# Patient Record
Sex: Female | Born: 1975 | Race: Black or African American | Hispanic: No | Marital: Single | Smoking: Light tobacco smoker
Health system: Southern US, Community
[De-identification: ages and names within clinical notes are randomized; demographics above are authoritative.]

## PROBLEM LIST (undated history)

## (undated) ENCOUNTER — Emergency Department (HOSPITAL_COMMUNITY): Discharge status: Absent without leave | Payer: Medicaid - Out of State | Source: Home / Self Care

## (undated) DIAGNOSIS — G8929 Other chronic pain: Secondary | ICD-10-CM

## (undated) DIAGNOSIS — I10 Essential (primary) hypertension: Secondary | ICD-10-CM

## (undated) DIAGNOSIS — D219 Benign neoplasm of connective and other soft tissue, unspecified: Secondary | ICD-10-CM

## (undated) DIAGNOSIS — E0500 Thyrotoxicosis with diffuse goiter without thyrotoxic crisis or storm: Secondary | ICD-10-CM

## (undated) DIAGNOSIS — D649 Anemia, unspecified: Secondary | ICD-10-CM

## (undated) DIAGNOSIS — O139 Gestational [pregnancy-induced] hypertension without significant proteinuria, unspecified trimester: Secondary | ICD-10-CM

## (undated) DIAGNOSIS — M549 Dorsalgia, unspecified: Secondary | ICD-10-CM

## (undated) HISTORY — PX: NO PAST SURGERIES: SHX2092

---

## 1998-05-08 ENCOUNTER — Inpatient Hospital Stay (HOSPITAL_COMMUNITY): Admission: AD | Admit: 1998-05-08 | Discharge: 1998-05-08 | Payer: Self-pay | Admitting: Obstetrics

## 1998-06-13 ENCOUNTER — Ambulatory Visit (HOSPITAL_COMMUNITY): Admission: RE | Admit: 1998-06-13 | Discharge: 1998-06-13 | Payer: Self-pay | Admitting: Obstetrics

## 1998-07-03 ENCOUNTER — Inpatient Hospital Stay (HOSPITAL_COMMUNITY): Admission: AD | Admit: 1998-07-03 | Discharge: 1998-07-03 | Payer: Self-pay | Admitting: *Deleted

## 1998-07-27 ENCOUNTER — Inpatient Hospital Stay (HOSPITAL_COMMUNITY): Admission: AD | Admit: 1998-07-27 | Discharge: 1998-07-27 | Payer: Self-pay | Admitting: Obstetrics & Gynecology

## 1998-07-29 ENCOUNTER — Inpatient Hospital Stay (HOSPITAL_COMMUNITY): Admission: AD | Admit: 1998-07-29 | Discharge: 1998-07-31 | Payer: Self-pay | Admitting: *Deleted

## 2001-07-02 ENCOUNTER — Encounter: Payer: Self-pay | Admitting: *Deleted

## 2001-07-02 ENCOUNTER — Encounter: Admission: RE | Admit: 2001-07-02 | Discharge: 2001-07-02 | Payer: Self-pay | Admitting: *Deleted

## 2001-10-26 ENCOUNTER — Inpatient Hospital Stay (HOSPITAL_COMMUNITY): Admission: AD | Admit: 2001-10-26 | Discharge: 2001-10-26 | Payer: Self-pay | Admitting: *Deleted

## 2003-10-15 ENCOUNTER — Inpatient Hospital Stay (HOSPITAL_COMMUNITY): Admission: AD | Admit: 2003-10-15 | Discharge: 2003-10-15 | Payer: Self-pay | Admitting: *Deleted

## 2003-10-18 ENCOUNTER — Inpatient Hospital Stay (HOSPITAL_COMMUNITY): Admission: AD | Admit: 2003-10-18 | Discharge: 2003-10-18 | Payer: Self-pay | Admitting: *Deleted

## 2003-11-23 ENCOUNTER — Inpatient Hospital Stay (HOSPITAL_COMMUNITY): Admission: AD | Admit: 2003-11-23 | Discharge: 2003-11-23 | Payer: Self-pay | Admitting: Obstetrics and Gynecology

## 2003-12-12 ENCOUNTER — Inpatient Hospital Stay (HOSPITAL_COMMUNITY): Admission: AD | Admit: 2003-12-12 | Discharge: 2003-12-13 | Payer: Self-pay | Admitting: Obstetrics and Gynecology

## 2004-04-17 ENCOUNTER — Inpatient Hospital Stay (HOSPITAL_COMMUNITY): Admission: AD | Admit: 2004-04-17 | Discharge: 2004-04-17 | Payer: Self-pay | Admitting: Family Medicine

## 2004-05-24 ENCOUNTER — Ambulatory Visit (HOSPITAL_COMMUNITY): Admission: RE | Admit: 2004-05-24 | Discharge: 2004-05-24 | Payer: Self-pay | Admitting: *Deleted

## 2004-08-07 ENCOUNTER — Ambulatory Visit: Payer: Self-pay | Admitting: *Deleted

## 2004-08-07 ENCOUNTER — Inpatient Hospital Stay (HOSPITAL_COMMUNITY): Admission: AD | Admit: 2004-08-07 | Discharge: 2004-08-10 | Payer: Self-pay | Admitting: *Deleted

## 2008-02-06 ENCOUNTER — Inpatient Hospital Stay (HOSPITAL_COMMUNITY): Admission: AD | Admit: 2008-02-06 | Discharge: 2008-02-06 | Payer: Self-pay | Admitting: Obstetrics & Gynecology

## 2008-10-15 ENCOUNTER — Inpatient Hospital Stay (HOSPITAL_COMMUNITY): Admission: AD | Admit: 2008-10-15 | Discharge: 2008-10-15 | Payer: Self-pay | Admitting: Obstetrics & Gynecology

## 2009-09-30 ENCOUNTER — Ambulatory Visit: Payer: Self-pay | Admitting: Obstetrics and Gynecology

## 2009-09-30 ENCOUNTER — Inpatient Hospital Stay (HOSPITAL_COMMUNITY): Admission: AD | Admit: 2009-09-30 | Discharge: 2009-09-30 | Payer: Self-pay | Admitting: Obstetrics & Gynecology

## 2010-07-30 LAB — POCT PREGNANCY, URINE: Preg Test, Ur: POSITIVE

## 2010-07-30 LAB — URINALYSIS, ROUTINE W REFLEX MICROSCOPIC
Glucose, UA: NEGATIVE mg/dL
Hgb urine dipstick: NEGATIVE
Specific Gravity, Urine: >1.03 — ABNORMAL HIGH (ref 1.005–1.030)
Urobilinogen, UA: 0.2 mg/dL (ref 0.0–1.0)

## 2010-07-30 LAB — URINE MICROSCOPIC-ADD ON

## 2011-05-26 ENCOUNTER — Emergency Department (HOSPITAL_COMMUNITY)
Admission: EM | Admit: 2011-05-26 | Discharge: 2011-05-26 | Disposition: A | Discharge status: Home / Self Care | Payer: Self-pay | Attending: Emergency Medicine | Admitting: Emergency Medicine

## 2011-05-26 ENCOUNTER — Emergency Department (HOSPITAL_COMMUNITY): Payer: Self-pay

## 2011-05-26 ENCOUNTER — Encounter (HOSPITAL_COMMUNITY): Payer: Self-pay

## 2011-05-26 DIAGNOSIS — I10 Essential (primary) hypertension: Secondary | ICD-10-CM | POA: Insufficient documentation

## 2011-05-26 DIAGNOSIS — M542 Cervicalgia: Secondary | ICD-10-CM | POA: Insufficient documentation

## 2011-05-26 DIAGNOSIS — Z79899 Other long term (current) drug therapy: Secondary | ICD-10-CM | POA: Insufficient documentation

## 2011-05-26 DIAGNOSIS — M549 Dorsalgia, unspecified: Secondary | ICD-10-CM

## 2011-05-26 DIAGNOSIS — R209 Unspecified disturbances of skin sensation: Secondary | ICD-10-CM | POA: Insufficient documentation

## 2011-05-26 DIAGNOSIS — M545 Low back pain, unspecified: Secondary | ICD-10-CM | POA: Insufficient documentation

## 2011-05-26 DIAGNOSIS — R51 Headache: Secondary | ICD-10-CM | POA: Insufficient documentation

## 2011-05-26 HISTORY — DX: Other chronic pain: G89.29

## 2011-05-26 HISTORY — DX: Dorsalgia, unspecified: M54.9

## 2011-05-26 HISTORY — DX: Essential (primary) hypertension: I10

## 2011-05-26 MED ORDER — OXYCODONE-ACETAMINOPHEN 5-325 MG PO TABS
1.0000 | ORAL_TABLET | Freq: Once | ORAL | Status: AC
  Administered 2011-05-26: 1 via ORAL
  Filled 2011-05-26: qty 1

## 2011-05-26 MED ORDER — OXYCODONE-ACETAMINOPHEN 5-325 MG PO TABS: 1.0000 | ORAL_TABLET | ORAL | Status: AC | PRN
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
## 2011-06-24 ENCOUNTER — Encounter (HOSPITAL_COMMUNITY): Payer: Self-pay | Admitting: Emergency Medicine

## 2011-06-24 ENCOUNTER — Emergency Department (HOSPITAL_COMMUNITY)
Admission: EM | Admit: 2011-06-24 | Discharge: 2011-06-25 | Disposition: A | Discharge status: Home / Self Care | Payer: Self-pay | Attending: Emergency Medicine | Admitting: Emergency Medicine

## 2011-06-24 DIAGNOSIS — I10 Essential (primary) hypertension: Secondary | ICD-10-CM | POA: Insufficient documentation

## 2011-06-24 DIAGNOSIS — M549 Dorsalgia, unspecified: Secondary | ICD-10-CM | POA: Insufficient documentation

## 2011-06-24 DIAGNOSIS — D649 Anemia, unspecified: Secondary | ICD-10-CM | POA: Insufficient documentation

## 2011-06-24 DIAGNOSIS — R109 Unspecified abdominal pain: Secondary | ICD-10-CM | POA: Insufficient documentation

## 2011-06-24 DIAGNOSIS — R10819 Abdominal tenderness, unspecified site: Secondary | ICD-10-CM | POA: Insufficient documentation

## 2011-06-24 DIAGNOSIS — G8929 Other chronic pain: Secondary | ICD-10-CM | POA: Insufficient documentation

## 2011-06-24 LAB — BASIC METABOLIC PANEL
BUN: 9 mg/dL (ref 6–23)
CO2: 23 mEq/L (ref 19–32)
Calcium: 10 mg/dL (ref 8.4–10.5)
Chloride: 103 mEq/L (ref 96–112)
Creatinine, Ser: 0.53 mg/dL (ref 0.50–1.10)
GFR calc Af Amer: >90 mL/min (ref >90)
GFR calc non Af Amer: >90 mL/min (ref >90)
Glucose, Bld: 111 mg/dL — ABNORMAL HIGH (ref 70–99)
Potassium: 3.8 mEq/L (ref 3.5–5.1)
Sodium: 136 mEq/L (ref 135–145)

## 2011-06-24 LAB — URINALYSIS, ROUTINE W REFLEX MICROSCOPIC
Glucose, UA: NEGATIVE mg/dL
Hgb urine dipstick: NEGATIVE
Ketones, ur: NEGATIVE mg/dL
Nitrite: NEGATIVE
Protein, ur: NEGATIVE mg/dL
Specific Gravity, Urine: 1.028 (ref 1.005–1.030)
Urobilinogen, UA: 1 mg/dL (ref 0.0–1.0)
pH: 5.5 (ref 5.0–8.0)

## 2011-06-24 LAB — CBC
HCT: 30.7 % — ABNORMAL LOW (ref 36.0–46.0)
Hemoglobin: 9.8 g/dL — ABNORMAL LOW (ref 12.0–15.0)
MCH: 20 pg — ABNORMAL LOW (ref 26.0–34.0)
MCHC: 31.9 g/dL (ref 30.0–36.0)
MCV: 62.5 fL — ABNORMAL LOW (ref 78.0–100.0)
Platelets: 309 10*3/uL (ref 150–400)
RBC: 4.91 MIL/uL (ref 3.87–5.11)
RDW: 17 % — ABNORMAL HIGH (ref 11.5–15.5)
WBC: 5.5 10*3/uL (ref 4.0–10.5)

## 2011-06-24 LAB — DIFFERENTIAL
Basophils Absolute: 0 10*3/uL (ref 0.0–0.1)
Basophils Relative: 0 % (ref 0–1)
Eosinophils Absolute: 0.1 10*3/uL (ref 0.0–0.7)
Eosinophils Relative: 2 % (ref 0–5)
Lymphocytes Relative: 40 % (ref 12–46)
Lymphs Abs: 2.2 10*3/uL (ref 0.7–4.0)
Monocytes Absolute: 0.4 10*3/uL (ref 0.1–1.0)
Monocytes Relative: 8 % (ref 3–12)
Neutro Abs: 2.7 10*3/uL (ref 1.7–7.7)
Neutrophils Relative %: 50 % (ref 43–77)

## 2011-06-24 LAB — URINE MICROSCOPIC-ADD ON

## 2011-06-24 LAB — PREGNANCY, URINE: Preg Test, Ur: NEGATIVE

## 2011-06-24 MED ORDER — SULFAMETHOXAZOLE-TRIMETHOPRIM 800-160 MG PO TABS: 1.0000 | ORAL_TABLET | Freq: Two times a day (BID) | ORAL | Status: AC

## 2011-06-24 NOTE — ED Notes (Signed)
PT. REPORTS LOW ABDOMINAL PAIN ONSET 2 DAYS AGO , DENIES NAUSEA, VOMITTING OR DIARRHEA , NO FEVER OR CHILLS.

## 2011-06-24 NOTE — Discharge Instructions (Signed)

## 2011-06-24 NOTE — ED Notes (Signed)
PT. REPORTS LOW ABDOMINAL PAIN FOR 2 DAYS , DENIES NAUSEA , VOMITTING OR DIARRHEA.  NO VAGINAL DISCHARGE .

## 2011-06-24 NOTE — ED Notes (Signed)
Patient alert and oriented with no distress noted.  Patient rating pain at a 7.  Patient states pain is about the same, no change.

## 2011-06-24 NOTE — ED Provider Notes (Signed)
History    35yf with abdominal pain. Gradual onset 3d ago. Lower abdomen. Does not lateralize. No appreciable exacerbating or relieving factors. Does not radiate. Urinary urgency and urine appears darker than normal. No fever or chills. No unusual vaginal bleeding or discharge. LMP February 23. Denies hx of abdominal or pelvic surgery. Sexually active. Hx of UTI and PID.   CSN: [id number]  Arrival date & time 06/24/11  4098   First MD Initiated Contact with Patient 06/24/11 2303      Chief Complaint  Patient presents with  . Abdominal Pain    (Consider location/radiation/quality/duration/timing/severity/associated sxs/prior treatment) HPI  Past Medical History  Diagnosis Date  . Chronic back pain   . Hypertension     History reviewed. No pertinent past surgical history.  No family history on file.  History  Substance Use Topics  . Smoking status: Never Smoker   . Smokeless tobacco: Not on file  . Alcohol Use: No    OB History    Grav Para Term Preterm Abortions TAB SAB Ect Mult Living                  Review of Systems   Review of symptoms negative unless otherwise noted in HPI.   Allergies  Review of patient's allergies indicates no known allergies.  Home Medications   Current Outpatient Rx  Name Route Sig Dispense Refill  . ADULT MULTIVITAMIN W/MINERALS CH Oral Take 1 tablet by mouth daily.      BP 139/91  Pulse 99  Temp(Src) 97.6 F (36.4 C) (Oral)  Resp 18  SpO2 98%  LMP 05/16/2011  Physical Exam  Nursing note and vitals reviewed. Constitutional: She appears well-developed and well-nourished. No distress.  HENT:  Head: Normocephalic and atraumatic.  Eyes: Conjunctivae are normal. Right eye exhibits no discharge. Left eye exhibits no discharge.  Neck: Neck supple.  Cardiovascular: Normal rate, regular rhythm and normal heart sounds.  Exam reveals no gallop and no friction rub.   No murmur heard. Pulmonary/Chest: Effort normal and breath  sounds normal. No respiratory distress.  Abdominal: Soft. She exhibits no distension. There is tenderness.       mild suprapubic tenderness without guarding or rebound. No distension. No mass palpated.  Genitourinary:       No cva tenderness b/l.  Musculoskeletal: She exhibits no edema and no tenderness.  Neurological: She is alert.  Skin: Skin is warm and dry.  Psychiatric: She has a normal mood and affect. Her behavior is normal. Thought content normal.    ED Course  Procedures (including critical care time)  Labs Reviewed  URINALYSIS, ROUTINE W REFLEX MICROSCOPIC - Abnormal; Notable for the following:    APPearance CLOUDY (*)    Bilirubin Urine SMALL (*)    Leukocytes, UA SMALL (*)    All other components within normal limits  CBC - Abnormal; Notable for the following:    Hemoglobin 9.8 (*)    HCT 30.7 (*)    MCV 62.5 (*)    MCH 20.0 (*)    RDW 17.0 (*)    All other components within normal limits  BASIC METABOLIC PANEL - Abnormal; Notable for the following:    Glucose, Bld 111 (*)    All other components within normal limits  URINE MICROSCOPIC-ADD ON - Abnormal; Notable for the following:    Squamous Epithelial / LPF MANY (*)    Bacteria, UA MANY (*)    Casts HYALINE CASTS (*)    All other components  within normal limits  PREGNANCY, URINE  DIFFERENTIAL  GC/CHLAMYDIA PROBE AMP, GENITAL  WET PREP, GENITAL   No results found.   1. Abdominal pain   2. anemia    MDM  35yf with abdominal pain. Mild suprapubic tenderness on exam. Noted to be anemic, chronicity unclear. No prior labs for comparison. Patient denies known history of anemia. She denies heavy periods. No use of blood thinning medication. Denies dizziness, lightheadedness or shortness of breath. Patient is refusing pelvic exam. She is requesting treatment for possible pelvic inflammatory disease and bacterial vaginosis since she has a history of this. Explained to patient that it is irresponsible medicine to  just simply treat empirically and cannot make these diagnoses without doing a pelvic examination. Will treat for possible urinary tract infection. There were bacteria on patient's urinalysis, but there are also a lot of squamous cells and this may just be contamination. Given patient's symptoms though, do not feel it is unreasonable to treat for UTI. Patient states that she will just get her pelvic examination by her PCP which is done in the past. Return precautions were discussed. Outpatient followup.       Raeford Razor, MD 06/24/11 [phone]

## 2011-06-25 NOTE — ED Notes (Signed)
Introduced self to patient.  Patient states she does not want a pelvic exam and is ready to go.  Dr. Juleen China notified.

## 2011-10-01 ENCOUNTER — Emergency Department (HOSPITAL_COMMUNITY)
Admission: EM | Admit: 2011-10-01 | Discharge: 2011-10-01 | Disposition: A | Discharge status: Home Health | Payer: Self-pay | Attending: Emergency Medicine | Admitting: Emergency Medicine

## 2011-10-01 ENCOUNTER — Encounter (HOSPITAL_COMMUNITY): Payer: Self-pay | Admitting: Emergency Medicine

## 2011-10-01 DIAGNOSIS — G8929 Other chronic pain: Secondary | ICD-10-CM | POA: Insufficient documentation

## 2011-10-01 DIAGNOSIS — L03019 Cellulitis of unspecified finger: Secondary | ICD-10-CM | POA: Insufficient documentation

## 2011-10-01 DIAGNOSIS — I10 Essential (primary) hypertension: Secondary | ICD-10-CM | POA: Insufficient documentation

## 2011-10-01 DIAGNOSIS — IMO0001 Reserved for inherently not codable concepts without codable children: Secondary | ICD-10-CM

## 2011-10-01 DIAGNOSIS — F17200 Nicotine dependence, unspecified, uncomplicated: Secondary | ICD-10-CM | POA: Insufficient documentation

## 2011-10-01 DIAGNOSIS — M549 Dorsalgia, unspecified: Secondary | ICD-10-CM | POA: Insufficient documentation

## 2011-10-01 MED ORDER — SULFAMETHOXAZOLE-TRIMETHOPRIM 800-160 MG PO TABS: 1.0000 | ORAL_TABLET | Freq: Two times a day (BID) | ORAL | Status: DC

## 2011-10-01 MED ORDER — SULFAMETHOXAZOLE-TRIMETHOPRIM 800-160 MG PO TABS: 1.0000 | ORAL_TABLET | Freq: Two times a day (BID) | ORAL | Status: AC

## 2011-10-01 NOTE — ED Provider Notes (Signed)
Medical screening examination/treatment/procedure(s) were performed by non-physician practitioner and as supervising physician I was immediately available for consultation/collaboration.  Donnetta Hutching, MD 10/01/11 [phone]

## 2011-10-01 NOTE — ED Provider Notes (Signed)
History     CSN: [id number]  Arrival date & time 10/01/11  0935   First MD Initiated Contact with Patient 10/01/11 [phone]      Chief Complaint  Patient presents with  . Finger Injury    (Consider location/radiation/quality/duration/timing/severity/associated sxs/prior treatment) HPI   Patient presents to emergency department complaining of a three-day history of gradual onset infection surrounding her cuticle of her second digit. Patient states that she had a manicure about a week ago and then 3 days ago began to have some redness and swelling around the cuticle of her right second finger. Patient states that she has been soaking her finger in peroxide at home and covering with the bandage however ongoing redness and tenderness to palpation. She denies any noticeable drainage. She denies pain with range of motion of her finger. She denies fevers, chills, drainage, or pain extending or radiating into her hand. Patient states she has history of some chronic back pain and hypertension that is followed by her primary care provider, Dr. Mayford Knife. Denies aggravating or alleviating factors.  Past Medical History  Diagnosis Date  . Chronic back pain   . Hypertension     History reviewed. No pertinent past surgical history.  History reviewed. No pertinent family history.  History  Substance Use Topics  . Smoking status: Current Everyday Smoker  . Smokeless tobacco: Not on file  . Alcohol Use: No    OB History    Grav Para Term Preterm Abortions TAB SAB Ect Mult Living                  Review of Systems  All other systems reviewed and are negative.    Allergies  Hydrocodone and Latex  Home Medications   Current Outpatient Rx  Name Route Sig Dispense Refill  . ADULT MULTIVITAMIN W/MINERALS CH Oral Take 1 tablet by mouth daily.    Marland Kitchen NAPROXEN SODIUM 220 MG PO TABS Oral Take 220 mg by mouth 2 (two) times daily as needed. For pain    . SULFAMETHOXAZOLE-TRIMETHOPRIM 800-160 MG  PO TABS Oral Take 1 tablet by mouth every 12 (twelve) hours. 10 tablet 0    BP 152/89  Pulse 118  Temp(Src) 98.1 F (36.7 C) (Oral)  Resp 16  SpO2 96%  Physical Exam  Constitutional: She is oriented to person, place, and time. She appears well-developed and well-nourished. No distress.  HENT:  Head: Normocephalic and atraumatic.  Eyes: Conjunctivae are normal.  Cardiovascular: Normal rate and regular rhythm.   Pulmonary/Chest: Effort normal.  Musculoskeletal: Normal range of motion. She exhibits edema and tenderness.       Right ankle: She exhibits swelling. tenderness.       TTP of cuticle of right second finger but no TTP of pulp of finger or DIP joint. FROM of finger with no pain. Soft tissue swelling around cuticle of right second finger with erythema but no fluctuance or induration.   Normal cap refill and sensation of entire finger  Neurological: She is alert and oriented to person, place, and time.       Normal sensation of entire foot.   Skin: Skin is warm and dry. No rash noted. She is not diaphoretic. There is erythema. No pallor.       Soft tissue swelling and erythema of cuticle region of right second finger but no induration or fluctuance.   Psychiatric: She has a normal mood and affect. Her behavior is normal.    ED Course  Procedures (including  critical care time)   Labs Reviewed - No data to display No results found.   1. Paronychia of second finger of right hand       MDM  Paronychia of right pointer finger without drainable abscess or sign or felon. TTP at distal tip around nail edge but no TTP of pulp of finger and no TTP of DIP joint with FROM of finger with 5/5 strength without pain. Patient has PCP that she is agreeable to close follow up in 2-3 days for recheck but returning to ER sooner for changing or worsening of symptoms.         [place], [place] 10/01/11 1020

## 2011-10-01 NOTE — ED Notes (Signed)
Pt sts cuticle infection to right 2nd digit; pt sts had nails done 1 week ago; pt noted to have dirt under finger nails; pt sts draining pus

## 2011-10-01 NOTE — Discharge Instructions (Signed)
Soak finger in warm soapy water in the morning and evening and then apply topical antibiotic ointment such as polysporin. Take oral antibiotic in complete course. Call Dr. Mayford Knife today to schedule close follow up appointment in 2-3 days for recheck of finger but return to ER for any changing or worsening of symptoms.   Paronychia Paronychia is an inflammatory reaction involving the folds of the skin surrounding the fingernail. This is commonly caused by an infection in the skin around a nail. The most common cause of paronychia is frequent wetting of the hands (as seen with bartenders, food servers, nurses or others who wet their hands). This makes the skin around the fingernail susceptible to infection by bacteria (germs) or fungus. Other predisposing factors are:  Aggressive manicuring.   Nail biting.   Thumb sucking.  The most common cause is a staphylococcal (a type of germ) infection, or a fungal (Candida) infection. When caused by a germ, it usually comes on suddenly with redness, swelling, pus and is often painful. It may get under the nail and form an abscess (collection of pus), or form an abscess around the nail. If the nail itself is infected with a fungus, the treatment is usually prolonged and may require oral medicine for up to one year. Your caregiver will determine the length of time treatment is required. The paronychia caused by bacteria (germs) may largely be avoided by not pulling on hangnails or picking at cuticles. When the infection occurs at the tips of the finger it is called felon. When the cause of paronychia is from the herpes simplex virus (HSV) it is called herpetic whitlow. TREATMENT  When an abscess is present treatment is often incision and drainage. This means that the abscess must be cut open so the pus can get out. When this is done, the following home care instructions should be followed. HOME CARE INSTRUCTIONS   It is important to keep the affected fingers very  dry. Rubber or plastic gloves over cotton gloves should be used whenever the hand must be placed in water.   Keep wound clean, dry and dressed as suggested by your caregiver between warm soaks or warm compresses.   Soak in warm water for fifteen to twenty minutes three to four times per day for bacterial infections. Fungal infections are very difficult to treat, so often require treatment for long periods of time.   For bacterial (germ) infections take antibiotics (medicine which kill germs) as directed and finish the prescription, even if the problem appears to be solved before the medicine is gone.   Only take over-the-counter or prescription medicines for pain, discomfort, or fever as directed by your caregiver.  SEEK IMMEDIATE MEDICAL CARE IF:  You have redness, swelling, or increasing pain in the wound.   You notice pus coming from the wound.   You have a fever.   You notice a bad smell coming from the wound or dressing.  Document Released: 10/02/2000 Document Revised: 03/28/2011 Document Reviewed: 06/03/2008 [facility] Patient Information 2012 [place], [place].

## 2011-11-21 ENCOUNTER — Encounter: Payer: Self-pay | Admitting: Obstetrics & Gynecology

## 2012-02-11 ENCOUNTER — Inpatient Hospital Stay (HOSPITAL_COMMUNITY)
Admission: AD | Admit: 2012-02-11 | Discharge: 2012-02-11 | Disposition: A | Discharge status: Hospice | Payer: Medicaid Other | Source: Ambulatory Visit | Attending: Obstetrics | Admitting: Obstetrics

## 2012-02-11 ENCOUNTER — Encounter (HOSPITAL_COMMUNITY): Payer: Self-pay | Admitting: *Deleted

## 2012-02-11 DIAGNOSIS — R109 Unspecified abdominal pain: Secondary | ICD-10-CM | POA: Insufficient documentation

## 2012-02-11 DIAGNOSIS — Z3202 Encounter for pregnancy test, result negative: Secondary | ICD-10-CM | POA: Insufficient documentation

## 2012-02-11 HISTORY — DX: Gestational (pregnancy-induced) hypertension without significant proteinuria, unspecified trimester: O13.9

## 2012-02-11 HISTORY — DX: Anemia, unspecified: D64.9

## 2012-02-11 LAB — URINALYSIS, ROUTINE W REFLEX MICROSCOPIC
Nitrite: NEGATIVE
Urobilinogen, UA: 0.2 mg/dL (ref 0.0–1.0)
pH: 6 (ref 5.0–8.0)

## 2012-02-11 LAB — POCT PREGNANCY, URINE: Preg Test, Ur: NEGATIVE

## 2012-02-11 NOTE — MAU Provider Note (Signed)
History     CSN: [id number]  Arrival date and time: 02/11/12 1732   None     No chief complaint on file.  HPI 36 y.o. [id number] with lower abdominal pain for 3 days. Pain is crampy, comes and goes. It has now resolved. No vaginal bleeding or discharge. Did have BV a month ago, treated.  Missed period - LMP:  9/15. Worried she is pregnant.  No contraception.   Just had pelvic exam/pap a month ago with [facility].  OB History    Grav Para Term Preterm Abortions TAB SAB Ect Mult Living   5 3 3  2 1  1  3       Past Medical History  Diagnosis Date  . Chronic back pain   . Hypertension   . Pregnancy induced hypertension   . Anemia     Past Surgical History  Procedure Date  . No past surgeries     Family History  Problem Relation Age of Onset  . Diabetes Maternal Aunt   . Diabetes Maternal Uncle   . Diabetes Maternal Grandmother   . Diabetes Maternal Grandfather   . Stroke Paternal Grandfather     History  Substance Use Topics  . Smoking status: Current Every Day Smoker -- 7.0 packs/day for 14 years    Types: Cigarettes  . Smokeless tobacco: Not on file  . Alcohol Use: Yes     on occasion    Allergies:  Allergies  Allergen Reactions  . Hydrocodone Nausea And Vomiting  . Latex Itching and Rash    Prescriptions prior to admission  Medication Sig Dispense Refill  . aspirin 325 MG tablet Take 650 mg by mouth every 6 (six) hours as needed. For pain      . Multiple Vitamin (MULITIVITAMIN WITH MINERALS) TABS Take 1 tablet by mouth daily.      Marland Kitchen Phenylephrine-DM-GG-APAP (VICKS DAYQUIL SEVERE COLD/FLU) 5-10-200-325 MG/15ML LIQD Take 30 mLs by mouth every 4 (four) hours as needed. For cold symptoms        Review of Systems  Constitutional: Negative for fever and chills.  HENT: Positive for congestion.   Eyes: Negative for blurred vision and double vision.  Gastrointestinal: Negative for nausea, vomiting, diarrhea and constipation.  Genitourinary: Negative for dysuria,  urgency and frequency.  Neurological: Negative for dizziness and headaches.    Physical Exam   Blood pressure 148/91, pulse 121, temperature 98.1 F (36.7 C), temperature source Oral, resp. rate 18, height 5\' 5"  (1.651 m), weight 52.164 kg (115 lb), last menstrual period 01/05/2012.  Physical Exam  Constitutional: She is oriented to person, place, and time. She appears well-developed and well-nourished. No distress.  HENT:  Head: Normocephalic and atraumatic.  Eyes: Conjunctivae normal and EOM are normal.  Neck: Normal range of motion. Neck supple.  Cardiovascular: Normal rate, regular rhythm and normal heart sounds.   Respiratory: Effort normal and breath sounds normal. No respiratory distress.  GI: Soft. Bowel sounds are normal. She exhibits no distension. There is no tenderness. There is no rebound and no guarding.  Genitourinary:       Declined pelvic exam.  Musculoskeletal: Normal range of motion. She exhibits no edema and no tenderness.  Neurological: She is alert and oriented to person, place, and time.  Skin: Skin is warm and dry.  Psychiatric: She has a normal mood and affect.   Results for orders placed during the hospital encounter of 02/11/12 (from the past 24 hour(s))  URINALYSIS, ROUTINE W REFLEX MICROSCOPIC  Status: Normal   Collection Time   02/11/12  5:50 PM      Component Value Range   Color, Urine YELLOW  YELLOW   APPearance CLEAR  CLEAR   Specific Gravity, Urine 1.025  1.005 - 1.030   pH 6.0  5.0 - 8.0   Glucose, UA NEGATIVE  NEGATIVE mg/dL   Hgb urine dipstick NEGATIVE  NEGATIVE   Bilirubin Urine NEGATIVE  NEGATIVE   Ketones, ur NEGATIVE  NEGATIVE mg/dL   Protein, ur NEGATIVE  NEGATIVE mg/dL   Urobilinogen, UA 0.2  0.0 - 1.0 mg/dL   Nitrite NEGATIVE  NEGATIVE   Leukocytes, UA NEGATIVE  NEGATIVE  POCT PREGNANCY, URINE     Status: Normal   Collection Time   02/11/12  5:57 PM      Component Value Range   Preg Test, Ur NEGATIVE  NEGATIVE    MAU  Course  Procedures    Assessment and Plan  36 y.o. [id number] with abdominal pain, now resolved. - Declines pelvic exam - Really only came for pregnancy test - Discharge home - f/u with [facility] as scheduled.  Napoleon Form 02/11/2012, 6:32 PM

## 2012-02-11 NOTE — MAU Note (Signed)
Pt was treated for BV one month ago, and states she started with low abd pain for the past three days and denies any urinary problems.  Says she thinks she is pregnant and wanted to come here for a pregnancy test.

## 2012-05-13 ENCOUNTER — Encounter (HOSPITAL_COMMUNITY): Payer: Self-pay | Admitting: *Deleted

## 2012-05-13 ENCOUNTER — Emergency Department (HOSPITAL_COMMUNITY)
Admission: EM | Admit: 2012-05-13 | Discharge: 2012-05-13 | Disposition: A | Discharge status: Home / Self Care | Payer: Medicaid Other | Attending: Emergency Medicine | Admitting: Emergency Medicine

## 2012-05-13 DIAGNOSIS — F17200 Nicotine dependence, unspecified, uncomplicated: Secondary | ICD-10-CM | POA: Insufficient documentation

## 2012-05-13 DIAGNOSIS — G8929 Other chronic pain: Secondary | ICD-10-CM | POA: Insufficient documentation

## 2012-05-13 DIAGNOSIS — I10 Essential (primary) hypertension: Secondary | ICD-10-CM | POA: Insufficient documentation

## 2012-05-13 DIAGNOSIS — Z201 Contact with and (suspected) exposure to tuberculosis: Secondary | ICD-10-CM

## 2012-05-13 DIAGNOSIS — Z8739 Personal history of other diseases of the musculoskeletal system and connective tissue: Secondary | ICD-10-CM | POA: Insufficient documentation

## 2012-05-13 DIAGNOSIS — Z8679 Personal history of other diseases of the circulatory system: Secondary | ICD-10-CM | POA: Insufficient documentation

## 2012-05-13 DIAGNOSIS — Z3202 Encounter for pregnancy test, result negative: Secondary | ICD-10-CM | POA: Insufficient documentation

## 2012-05-13 DIAGNOSIS — Z862 Personal history of diseases of the blood and blood-forming organs and certain disorders involving the immune mechanism: Secondary | ICD-10-CM | POA: Insufficient documentation

## 2012-05-13 LAB — PREGNANCY, URINE: Preg Test, Ur: NEGATIVE
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
## 2012-05-13 NOTE — Discharge Instructions (Signed)
If you believe you have been exposed to tuberculosis, please make an appointment with your primary care physician or with the health department as soon as possible for risk assessment. See the attached documentation for signs and symptoms.

## 2012-05-16 NOTE — ED Provider Notes (Signed)
Medical screening examination/treatment/procedure(s) were performed by non-physician practitioner and as supervising physician I was immediately available for consultation/collaboration.   Marciano Mundt, MD 05/16/12 2354 

## 2012-05-16 NOTE — ED Provider Notes (Signed)
History     CSN: [id number]  Arrival date & time 05/13/12  2048   First MD Initiated Contact with Patient 05/13/12 2227      No chief complaint on file.   (Consider location/radiation/quality/duration/timing/severity/associated sxs/prior treatment) HPI Comments: 37 yo female requesting pregnancy test and assessment for TB exposure. Pt states after she was in contact with someone who told her they had TB, she developed a dry cough for the last week. Non productive. Pt admits 98 pack year history of smoking. Denies hemoptysis, fever, illness, night sweats, dyspnea, allergies, recent travel, nausea, or vomiting. Pt rates cough as mild. Nothing makes it better or worse. Pt has taken no interventions. Pt has not had a recent PPD.  Patient is a 37 y.o. female presenting with cough.  Cough Pertinent negatives include no chest pain, no headaches and no shortness of breath.    Past Medical History  Diagnosis Date  . Chronic back pain   . Hypertension   . Pregnancy induced hypertension   . Anemia     Past Surgical History  Procedure Date  . No past surgeries     Family History  Problem Relation Age of Onset  . Diabetes Maternal Aunt   . Diabetes Maternal Uncle   . Diabetes Maternal Grandmother   . Diabetes Maternal Grandfather   . Stroke Paternal Grandfather     History  Substance Use Topics  . Smoking status: Current Every Day Smoker -- 7.0 packs/day for 14 years    Types: Cigarettes  . Smokeless tobacco: Not on file  . Alcohol Use: Yes     Comment: on occasion    OB History    Grav Para Term Preterm Abortions TAB SAB Ect Mult Living   5 3 3  2 1  1  3       Review of Systems  Constitutional: Negative for fever and diaphoresis.  HENT: Negative for neck pain and neck stiffness.   Eyes: Negative for visual disturbance.  Respiratory: Positive for cough. Negative for apnea, chest tightness and shortness of breath.   Cardiovascular: Negative for chest pain and  palpitations.  Gastrointestinal: Negative for nausea, vomiting, diarrhea and constipation.  Genitourinary: Negative for dysuria.  Musculoskeletal: Negative for gait problem.  Skin: Negative for rash.  Neurological: Negative for dizziness, weakness, light-headedness, numbness and headaches.    Allergies  Hydrocodone and Latex  Home Medications  No current outpatient prescriptions on file.  BP 155/89  Pulse 100  Temp 98.7 F (37.1 C) (Oral)  Resp 18  SpO2 97%  LMP 04/24/2012  Physical Exam  Nursing note and vitals reviewed. Constitutional: She is oriented to person, place, and time. She appears well-developed and well-nourished. No distress.  HENT:  Head: Normocephalic and atraumatic.       No cough appreciated on exam.  Eyes: EOM are normal. Pupils are equal, round, and reactive to light.  Neck: Normal range of motion. Neck supple.       No meningeal signs  Cardiovascular: Normal rate, regular rhythm and normal heart sounds.  Exam reveals no gallop and no friction rub.   No murmur heard. Pulmonary/Chest: Effort normal and breath sounds normal. No respiratory distress. She has no wheezes. She has no rales. She exhibits no tenderness.  Abdominal: Soft. Bowel sounds are normal. She exhibits no distension. There is no tenderness. There is no rebound and no guarding.  Musculoskeletal: Normal range of motion. She exhibits no edema and no tenderness.  Neurological: She is alert and  oriented to person, place, and time. No cranial nerve deficit.  Skin: Skin is warm and dry. She is not diaphoretic. No erythema.    ED Course  Procedures (including critical care time)   Labs Reviewed  PREGNANCY, URINE  LAB REPORT - SCANNED   Results for orders placed during the hospital encounter of 05/13/12  PREGNANCY, URINE      Component Value Range   Preg Test, Ur NEGATIVE  NEGATIVE    No results found.   1. Exposure to TB       MDM  Pt requested pregnancy test which was  negative.  Consulted with ID MD on call who confirmed that pt needed follow up with PCP or with [facility] including PPD and proper risk assessment to assess whether or not true exposure has occurred.   At this time there does not appear to be any evidence of an acute emergency medical condition and the patient appears stable for discharge with appropriate outpatient follow up.Diagnosis was discussed with patient who verbalizes understanding and is agreeable to discharge. Pt case discussed with Dr. Ranae Palms who agrees with my plan.   Glade Nurse, PA-C 05/16/12 2228

## 2013-01-11 DIAGNOSIS — I5021 Acute systolic (congestive) heart failure: Principal | ICD-10-CM | POA: Diagnosis present

## 2013-01-11 DIAGNOSIS — E0500 Thyrotoxicosis with diffuse goiter without thyrotoxic crisis or storm: Secondary | ICD-10-CM | POA: Diagnosis present

## 2013-01-11 DIAGNOSIS — D509 Iron deficiency anemia, unspecified: Secondary | ICD-10-CM | POA: Diagnosis present

## 2013-01-11 DIAGNOSIS — F1410 Cocaine abuse, uncomplicated: Secondary | ICD-10-CM | POA: Diagnosis present

## 2013-01-11 DIAGNOSIS — Z681 Body mass index (BMI) 19 or less, adult: Secondary | ICD-10-CM

## 2013-01-11 DIAGNOSIS — I428 Other cardiomyopathies: Secondary | ICD-10-CM | POA: Diagnosis present

## 2013-01-11 DIAGNOSIS — E039 Hypothyroidism, unspecified: Secondary | ICD-10-CM | POA: Diagnosis present

## 2013-01-11 DIAGNOSIS — E43 Unspecified severe protein-calorie malnutrition: Secondary | ICD-10-CM | POA: Diagnosis present

## 2013-01-11 DIAGNOSIS — I509 Heart failure, unspecified: Secondary | ICD-10-CM | POA: Diagnosis present

## 2013-01-11 DIAGNOSIS — R000 Tachycardia, unspecified: Secondary | ICD-10-CM | POA: Diagnosis present

## 2013-01-11 DIAGNOSIS — H02409 Unspecified ptosis of unspecified eyelid: Secondary | ICD-10-CM | POA: Diagnosis present

## 2013-01-11 DIAGNOSIS — F17200 Nicotine dependence, unspecified, uncomplicated: Secondary | ICD-10-CM | POA: Diagnosis present

## 2013-01-11 DIAGNOSIS — D72819 Decreased white blood cell count, unspecified: Secondary | ICD-10-CM | POA: Diagnosis present

## 2013-01-12 ENCOUNTER — Ambulatory Visit (HOSPITAL_COMMUNITY): Payer: Medicaid Other

## 2013-01-12 ENCOUNTER — Emergency Department (HOSPITAL_COMMUNITY): Payer: Medicaid Other

## 2013-01-12 ENCOUNTER — Inpatient Hospital Stay (HOSPITAL_COMMUNITY): Payer: Medicaid Other

## 2013-01-12 ENCOUNTER — Inpatient Hospital Stay (HOSPITAL_COMMUNITY)
Admission: EM | Admit: 2013-01-12 | Discharge: 2013-01-14 | DRG: 291 | Disposition: A | Discharge status: Home Health | Payer: Medicaid Other | Attending: Internal Medicine | Admitting: Internal Medicine

## 2013-01-12 ENCOUNTER — Encounter (HOSPITAL_COMMUNITY): Payer: Self-pay | Admitting: *Deleted

## 2013-01-12 DIAGNOSIS — I059 Rheumatic mitral valve disease, unspecified: Secondary | ICD-10-CM

## 2013-01-12 DIAGNOSIS — E0590 Thyrotoxicosis, unspecified without thyrotoxic crisis or storm: Secondary | ICD-10-CM

## 2013-01-12 DIAGNOSIS — E0500 Thyrotoxicosis with diffuse goiter without thyrotoxic crisis or storm: Secondary | ICD-10-CM | POA: Diagnosis present

## 2013-01-12 DIAGNOSIS — I429 Cardiomyopathy, unspecified: Secondary | ICD-10-CM

## 2013-01-12 DIAGNOSIS — F1410 Cocaine abuse, uncomplicated: Secondary | ICD-10-CM | POA: Diagnosis present

## 2013-01-12 DIAGNOSIS — E43 Unspecified severe protein-calorie malnutrition: Secondary | ICD-10-CM | POA: Diagnosis present

## 2013-01-12 DIAGNOSIS — I10 Essential (primary) hypertension: Secondary | ICD-10-CM | POA: Diagnosis present

## 2013-01-12 DIAGNOSIS — I509 Heart failure, unspecified: Secondary | ICD-10-CM

## 2013-01-12 DIAGNOSIS — D509 Iron deficiency anemia, unspecified: Secondary | ICD-10-CM | POA: Diagnosis present

## 2013-01-12 DIAGNOSIS — I5021 Acute systolic (congestive) heart failure: Secondary | ICD-10-CM | POA: Diagnosis present

## 2013-01-12 DIAGNOSIS — F17200 Nicotine dependence, unspecified, uncomplicated: Secondary | ICD-10-CM | POA: Diagnosis present

## 2013-01-12 LAB — BASIC METABOLIC PANEL
BUN: 13 mg/dL (ref 6–23)
CO2: 24 mEq/L (ref 19–32)
Calcium: 9.3 mg/dL (ref 8.4–10.5)
Calcium: 8.9 mg/dL (ref 8.4–10.5)
Chloride: 101 mEq/L (ref 96–112)
Creatinine, Ser: 0.39 mg/dL — ABNORMAL LOW (ref 0.50–1.10)
GFR calc Af Amer: >90 mL/min (ref >90)
GFR calc Af Amer: >90 mL/min (ref >90)
GFR calc non Af Amer: >90 mL/min (ref >90)
Potassium: 5 mEq/L (ref 3.5–5.1)
Potassium: 4 mEq/L (ref 3.5–5.1)
Sodium: 133 mEq/L — ABNORMAL LOW (ref 135–145)
Sodium: 135 mEq/L (ref 135–145)

## 2013-01-12 LAB — T3, FREE: T3, Free: >20 pg/mL — ABNORMAL HIGH (ref 2.3–4.2)

## 2013-01-12 LAB — CBC
MCHC: 34.9 g/dL (ref 30.0–36.0)
RDW: 21.3 % — ABNORMAL HIGH (ref 11.5–15.5)

## 2013-01-12 LAB — RETICULOCYTES: Retic Count, Absolute: 95.7 10*3/uL (ref 19.0–186.0)

## 2013-01-12 LAB — URINALYSIS, ROUTINE W REFLEX MICROSCOPIC
Hgb urine dipstick: NEGATIVE
Ketones, ur: NEGATIVE mg/dL
Protein, ur: 30 mg/dL — AB
Urobilinogen, UA: 4 mg/dL — ABNORMAL HIGH (ref 0.0–1.0)

## 2013-01-12 LAB — HEPATIC FUNCTION PANEL
ALT: 40 U/L — ABNORMAL HIGH (ref 0–35)
AST: 90 U/L — ABNORMAL HIGH (ref 0–37)
Alkaline Phosphatase: 179 U/L — ABNORMAL HIGH (ref 39–117)
Indirect Bilirubin: 1.3 mg/dL — ABNORMAL HIGH (ref 0.3–0.9)
Total Bilirubin: 4.7 mg/dL — ABNORMAL HIGH (ref 0.3–1.2)
Total Protein: 7.8 g/dL (ref 6.0–8.3)

## 2013-01-12 LAB — VITAMIN B12: Vitamin B-12: 402 pg/mL (ref 211–911)

## 2013-01-12 LAB — URINE MICROSCOPIC-ADD ON

## 2013-01-12 LAB — MAGNESIUM: Magnesium: 1.6 mg/dL (ref 1.5–2.5)

## 2013-01-12 LAB — IRON AND TIBC
Iron: 99 ug/dL (ref 42–135)
Saturation Ratios: 41 % (ref 20–55)
UIBC: 144 ug/dL (ref 125–400)

## 2013-01-12 LAB — FOLATE: Folate: 18.3 ng/mL

## 2013-01-12 LAB — RAPID URINE DRUG SCREEN, HOSP PERFORMED
Amphetamines: NOT DETECTED
Barbiturates: NOT DETECTED
Benzodiazepines: NOT DETECTED
Tetrahydrocannabinol: NOT DETECTED

## 2013-01-12 LAB — TSH: TSH: <0.008 u[IU]/mL — ABNORMAL LOW (ref 0.350–4.500)

## 2013-01-12 LAB — PHOSPHORUS: Phosphorus: 4.2 mg/dL (ref 2.3–4.6)

## 2013-01-12 LAB — TROPONIN I: Troponin I: <0.3 ng/mL (ref <0.30)

## 2013-01-12 LAB — D-DIMER, QUANTITATIVE: D-Dimer, Quant: 1.15 ug/mL-FEU — ABNORMAL HIGH (ref 0.00–0.48)

## 2013-01-12 LAB — FERRITIN: Ferritin: 42 ng/mL (ref 10–291)

## 2013-01-12 MED ORDER — PROPRANOLOL HCL 20 MG PO TABS
20.0000 mg | ORAL_TABLET | Freq: Four times a day (QID) | ORAL | Status: DC
  Administered 2013-01-12 – 2013-01-14 (×8): 20 mg via ORAL
  Filled 2013-01-12 (×11): qty 1

## 2013-01-12 MED ORDER — METHIMAZOLE 10 MG PO TABS
20.0000 mg | ORAL_TABLET | Freq: Three times a day (TID) | ORAL | Status: DC
  Administered 2013-01-12 – 2013-01-14 (×6): 20 mg via ORAL
  Filled 2013-01-12 (×8): qty 2

## 2013-01-12 MED ORDER — SODIUM CHLORIDE 0.9 % IV BOLUS (SEPSIS)
1000.0000 mL | Freq: Once | INTRAVENOUS | Status: AC
  Administered 2013-01-12: 1000 mL via INTRAVENOUS

## 2013-01-12 MED ORDER — SODIUM CHLORIDE 0.9 % IJ SOLN
3.0000 mL | Freq: Two times a day (BID) | INTRAMUSCULAR | Status: DC
  Administered 2013-01-12 – 2013-01-14 (×4): 3 mL via INTRAVENOUS

## 2013-01-12 MED ORDER — ONDANSETRON HCL 4 MG/2ML IJ SOLN
4.0000 mg | Freq: Four times a day (QID) | INTRAMUSCULAR | Status: DC | PRN
  Administered 2013-01-12: 07:00:00 4 mg via INTRAVENOUS
  Filled 2013-01-12: qty 2

## 2013-01-12 MED ORDER — LIP MEDEX EX OINT: TOPICAL_OINTMENT | CUTANEOUS | Status: DC | PRN

## 2013-01-12 MED ORDER — LORAZEPAM 1 MG PO TABS
1.0000 mg | ORAL_TABLET | Freq: Four times a day (QID) | ORAL | Status: DC | PRN
  Administered 2013-01-13 (×2): 1 mg via ORAL
  Filled 2013-01-12 (×2): qty 1

## 2013-01-12 MED ORDER — ASPIRIN EC 325 MG PO TBEC
325.0000 mg | DELAYED_RELEASE_TABLET | Freq: Every day | ORAL | Status: DC
  Administered 2013-01-12: 12:00:00 325 mg via ORAL
  Filled 2013-01-12: qty 1

## 2013-01-12 MED ORDER — VITAMIN B-1 100 MG PO TABS
100.0000 mg | ORAL_TABLET | Freq: Every day | ORAL | Status: DC
  Administered 2013-01-12 – 2013-01-14 (×3): 100 mg via ORAL
  Filled 2013-01-12 (×3): qty 1

## 2013-01-12 MED ORDER — ACETAMINOPHEN 325 MG PO TABS: 650.0000 mg | ORAL_TABLET | Freq: Four times a day (QID) | ORAL | Status: DC | PRN

## 2013-01-12 MED ORDER — ONDANSETRON HCL 4 MG PO TABS: 4.0000 mg | ORAL_TABLET | Freq: Four times a day (QID) | ORAL | Status: DC | PRN

## 2013-01-12 MED ORDER — SODIUM CHLORIDE 0.9 % IJ SOLN
3.0000 mL | Freq: Two times a day (BID) | INTRAMUSCULAR | Status: DC
  Administered 2013-01-12 (×2): 3 mL via INTRAVENOUS

## 2013-01-12 MED ORDER — THIAMINE HCL 100 MG/ML IJ SOLN
100.0000 mg | Freq: Every day | INTRAMUSCULAR | Status: DC
  Filled 2013-01-12 (×3): qty 1

## 2013-01-12 MED ORDER — FOLIC ACID 1 MG PO TABS
1.0000 mg | ORAL_TABLET | Freq: Every day | ORAL | Status: DC
  Administered 2013-01-12 – 2013-01-14 (×3): 1 mg via ORAL
  Filled 2013-01-12 (×3): qty 1

## 2013-01-12 MED ORDER — IOHEXOL 350 MG/ML SOLN
80.0000 mL | Freq: Once | INTRAVENOUS | Status: AC | PRN
  Administered 2013-01-12: 80 mL via INTRAVENOUS

## 2013-01-12 MED ORDER — ADULT MULTIVITAMIN W/MINERALS CH
1.0000 | ORAL_TABLET | Freq: Every day | ORAL | Status: DC
  Administered 2013-01-12 – 2013-01-14 (×3): 1 via ORAL
  Filled 2013-01-12 (×3): qty 1

## 2013-01-12 MED ORDER — FUROSEMIDE 10 MG/ML IJ SOLN
40.0000 mg | Freq: Once | INTRAMUSCULAR | Status: AC
Start: 2013-01-12 — End: 2013-01-12
  Administered 2013-01-12: 40 mg via INTRAVENOUS
  Filled 2013-01-12: qty 4

## 2013-01-12 MED ORDER — ACETAMINOPHEN 650 MG RE SUPP: 650.0000 mg | Freq: Four times a day (QID) | RECTAL | Status: DC | PRN

## 2013-01-12 MED ORDER — LORAZEPAM 2 MG/ML IJ SOLN
1.0000 mg | Freq: Four times a day (QID) | INTRAMUSCULAR | Status: DC | PRN
  Administered 2013-01-12 – 2013-01-13 (×3): 1 mg via INTRAVENOUS
  Filled 2013-01-12 (×3): qty 1

## 2013-01-12 MED ORDER — NITROGLYCERIN 0.4 MG SL SUBL: 0.4000 mg | SUBLINGUAL_TABLET | SUBLINGUAL | Status: DC | PRN

## 2013-01-12 MED ORDER — ENSURE PUDDING PO PUDG
1.0000 | Freq: Two times a day (BID) | ORAL | Status: DC
  Administered 2013-01-12: 1 via ORAL
  Filled 2013-01-12 (×4): qty 1

## 2013-01-12 NOTE — Consult Note (Addendum)
Reason for Consult: Cardiomyopathy Referring Physician: [facility]  Mary Frey is an 37 y.o. female.  HPI: This pleasant 37 year old African American woman was admitted last evening with request for cocaine detoxification.  She has been in declining health.  She has had significant gradual weight loss.  She has been experiencing shortness of breath no chest pain.  She states that her last dose of cocaine was yesterday although her urine screen on admission for cocaine was negative.  In the emergency room she was noted to have sinus tachycardia and cardiomegaly.  She has an elevated pro BNP and received one dose of IV Lasix with improvement.  Subsequent workup has shown echocardiogram initial phone report of ejection fraction between 30 and 35%.  The final echo report is pending.  Initial thyroid function tests also suggest hyperthyroidism with free T4 of 8.45 and TSH of less than 0.008  Past Medical History  Diagnosis Date  . Chronic back pain   . Hypertension   . Pregnancy induced hypertension   . Anemia     Past Surgical History  Procedure Laterality Date  . No past surgeries      Family History  Problem Relation Age of Onset  . Diabetes Maternal Aunt   . Diabetes Maternal Uncle   . Diabetes Maternal Grandmother   . Diabetes Maternal Grandfather   . Stroke Paternal Grandfather     Social History:  reports that she has been smoking Cigarettes.  She has been smoking about 0.00 packs per day for the past 14 years. She does not have any smokeless tobacco history on file. She reports that  drinks alcohol. She reports that she uses illicit drugs (Cocaine).  Allergies:  Allergies  Allergen Reactions  . Hydrocodone Nausea And Vomiting  . Latex Itching and Rash    Medications:  Scheduled: . aspirin EC  325 mg Oral Daily  . feeding supplement  1 Container Oral BID BM  . folic acid  1 mg Oral Daily  . multivitamin with minerals  1 tablet Oral Daily  . sodium chloride   3 mL Intravenous Q12H  . sodium chloride  3 mL Intravenous Q12H  . thiamine  100 mg Oral Daily   Or  . thiamine  100 mg Intravenous Daily    Results for orders placed during the hospital encounter of 01/12/13 (from the past 48 hour(s))  CBC     Status: Abnormal   Collection Time    01/12/13  2:46 AM      Result Value Range   WBC 5.4  4.0 - 10.5 K/uL   RBC 4.34  3.87 - 5.11 MIL/uL   Hemoglobin 10.4 (*) 12.0 - 15.0 g/dL   HCT 21.3 (*) 08.6 - 57.8 %   MCV 68.7 (*) 78.0 - 100.0 fL   MCH 24.0 (*) 26.0 - 34.0 pg   MCHC 34.9  30.0 - 36.0 g/dL   RDW 46.9 (*) 62.9 - 52.8 %   Platelets 152  150 - 400 K/uL  BASIC METABOLIC PANEL     Status: Abnormal   Collection Time    01/12/13  2:46 AM      Result Value Range   Sodium 133 (*) 135 - 145 mEq/L   Potassium 5.0  3.5 - 5.1 mEq/L   Chloride 100  96 - 112 mEq/L   CO2 24  19 - 32 mEq/L   Glucose, Bld 90  70 - 99 mg/dL   BUN 15  6 - 23 mg/dL  Creatinine, Ser 0.34 (*) 0.50 - 1.10 mg/dL   Calcium 9.3  8.4 - 09.8 mg/dL   GFR calc non Af Amer >90  >90 mL/min   GFR calc Af Amer >90  >90 mL/min   Comment: (NOTE)     The eGFR has been calculated using the CKD EPI equation.     This calculation has not been validated in all clinical situations.     eGFR's persistently <90 mL/min signify possible Chronic Kidney     Disease.  PREGNANCY, URINE     Status: None   Collection Time    01/12/13  2:54 AM      Result Value Range   Preg Test, Ur NEGATIVE  NEGATIVE   Comment:            THE SENSITIVITY OF THIS     METHODOLOGY IS >20 mIU/mL.  URINALYSIS, ROUTINE W REFLEX MICROSCOPIC     Status: Abnormal   Collection Time    01/12/13  2:54 AM      Result Value Range   Color, Urine ORANGE (*) YELLOW   Comment: BIOCHEMICALS MAY BE AFFECTED BY COLOR   APPearance CLOUDY (*) CLEAR   Specific Gravity, Urine 1.030  1.005 - 1.030   pH 6.0  5.0 - 8.0   Glucose, UA NEGATIVE  NEGATIVE mg/dL   Hgb urine dipstick NEGATIVE  NEGATIVE   Bilirubin Urine MODERATE  (*) NEGATIVE   Ketones, ur NEGATIVE  NEGATIVE mg/dL   Protein, ur 30 (*) NEGATIVE mg/dL   Urobilinogen, UA 4.0 (*) 0.0 - 1.0 mg/dL   Nitrite NEGATIVE  NEGATIVE   Leukocytes, UA SMALL (*) NEGATIVE  URINE MICROSCOPIC-ADD ON     Status: Abnormal   Collection Time    01/12/13  2:54 AM      Result Value Range   Squamous Epithelial / LPF FEW (*) RARE   WBC, UA 0-2  <3 WBC/hpf   Bacteria, UA RARE  RARE   Urine-Other MUCOUS PRESENT    TSH     Status: Abnormal   Collection Time    01/12/13  3:03 AM      Result Value Range   TSH <0.008 (*) 0.350 - 4.500 uIU/mL   Comment: Performed at [facility]  T4, FREE     Status: Abnormal   Collection Time    01/12/13  3:03 AM      Result Value Range   Free T4 8.45 (*) 0.80 - 1.80 ng/dL   Comment: Performed at [facility]  TROPONIN I     Status: None   Collection Time    01/12/13  3:03 AM      Result Value Range   Troponin I <0.30  <0.30 ng/mL   Comment:            Due to the release kinetics of cTnI,     a negative result within the first hours     of the onset of symptoms does not rule out     myocardial infarction with certainty.     If myocardial infarction is still suspected,     repeat the test at appropriate intervals.  PRO B NATRIURETIC PEPTIDE     Status: Abnormal   Collection Time    01/12/13  3:04 AM      Result Value Range   Pro B Natriuretic peptide (BNP) 4083.0 (*) 0 - 125 pg/mL  URINE RAPID DRUG SCREEN (HOSP PERFORMED)     Status: None  Collection Time    01/12/13  6:34 AM      Result Value Range   Opiates NONE DETECTED  NONE DETECTED   Cocaine NONE DETECTED  NONE DETECTED   Benzodiazepines NONE DETECTED  NONE DETECTED   Amphetamines NONE DETECTED  NONE DETECTED   Tetrahydrocannabinol NONE DETECTED  NONE DETECTED   Barbiturates NONE DETECTED  NONE DETECTED   Comment:            DRUG SCREEN FOR MEDICAL PURPOSES     ONLY.  IF CONFIRMATION IS NEEDED     FOR ANY PURPOSE, NOTIFY LAB     WITHIN 5 DAYS.                 LOWEST DETECTABLE LIMITS     FOR URINE DRUG SCREEN     Drug Class       Cutoff (ng/mL)     Amphetamine      1000     Barbiturate      200     Benzodiazepine   200     Tricyclics       300     Opiates          300     Cocaine          300     THC              50  TROPONIN I     Status: None   Collection Time    01/12/13  6:55 AM      Result Value Range   Troponin I <0.30  <0.30 ng/mL   Comment:            Due to the release kinetics of cTnI,     a negative result within the first hours     of the onset of symptoms does not rule out     myocardial infarction with certainty.     If myocardial infarction is still suspected,     repeat the test at appropriate intervals.  D-DIMER, QUANTITATIVE     Status: Abnormal   Collection Time    01/12/13  6:55 AM      Result Value Range   D-Dimer, Quant 1.15 (*) 0.00 - 0.48 ug/mL-FEU   Comment:            AT THE INHOUSE ESTABLISHED CUTOFF     VALUE OF 0.48 ug/mL FEU,     THIS ASSAY HAS BEEN DOCUMENTED     IN THE LITERATURE TO HAVE     A SENSITIVITY AND NEGATIVE     PREDICTIVE VALUE OF AT LEAST     98 TO 99%.  THE TEST RESULT     SHOULD BE CORRELATED WITH     AN ASSESSMENT OF THE CLINICAL     PROBABILITY OF DVT / VTE.  BASIC METABOLIC PANEL     Status: Abnormal   Collection Time    01/12/13  6:55 AM      Result Value Range   Sodium 135  135 - 145 mEq/L   Potassium 4.0  3.5 - 5.1 mEq/L   Comment: DELTA CHECK NOTED     REPEATED TO VERIFY   Chloride 101  96 - 112 mEq/L   CO2 24  19 - 32 mEq/L   Glucose, Bld 147 (*) 70 - 99 mg/dL   BUN 13  6 - 23 mg/dL   Creatinine, Ser 1.61 (*) 0.50 - 1.10 mg/dL   Calcium 8.9  8.4 - 10.5 mg/dL   GFR calc non Af Amer >90  >90 mL/min   GFR calc Af Amer >90  >90 mL/min   Comment: (NOTE)     The eGFR has been calculated using the CKD EPI equation.     This calculation has not been validated in all clinical situations.     eGFR's persistently <90 mL/min signify possible Chronic Kidney      Disease.  HEPATIC FUNCTION PANEL     Status: Abnormal   Collection Time    01/12/13  6:55 AM      Result Value Range   Total Protein 7.8  6.0 - 8.3 g/dL   Albumin 2.8 (*) 3.5 - 5.2 g/dL   AST 90 (*) 0 - 37 U/L   ALT 40 (*) 0 - 35 U/L   Alkaline Phosphatase 179 (*) 39 - 117 U/L   Total Bilirubin 4.7 (*) 0.3 - 1.2 mg/dL   Bilirubin, Direct 3.4 (*) 0.0 - 0.3 mg/dL   Indirect Bilirubin 1.3 (*) 0.3 - 0.9 mg/dL  LIPASE, BLOOD     Status: None   Collection Time    01/12/13  6:55 AM      Result Value Range   Lipase 50  11 - 59 U/L  IRON AND TIBC     Status: Abnormal   Collection Time    01/12/13  6:55 AM      Result Value Range   Iron 99  42 - 135 ug/dL   TIBC 161 (*) 096 - 045 ug/dL   Saturation Ratios 41  20 - 55 %   UIBC 144  125 - 400 ug/dL   Comment: Performed at [facility]  RETICULOCYTES     Status: None   Collection Time    01/12/13  6:55 AM      Result Value Range   Retic Ct Pct 2.3  0.4 - 3.1 %   RBC. 4.16  3.87 - 5.11 MIL/uL   Retic Count, Manual 95.7  19.0 - 186.0 K/uL  MAGNESIUM     Status: None   Collection Time    01/12/13  6:55 AM      Result Value Range   Magnesium 1.6  1.5 - 2.5 mg/dL  PHOSPHORUS     Status: None   Collection Time    01/12/13  6:55 AM      Result Value Range   Phosphorus 4.2  2.3 - 4.6 mg/dL    Dg Chest 2 View  07/29/8117   CLINICAL DATA:  Tachycardia. Chest palpitations.  EXAM: CHEST  2 VIEW  COMPARISON:  None.  FINDINGS: There is cardiomegaly without edema. Lungs are clear. No pneumothorax or pleural fluid.  IMPRESSION: Cardiomegaly without acute disease.   Electronically Signed   By: Drusilla Kanner M.D.   On: 01/12/2013 03:29   Ct Angio Chest Pe W/cm &/or Wo Cm  01/12/2013   *RADIOLOGY REPORT*  Clinical Data: Tachycardia.  Positive D-dimer. Cocaine abuse. Hypertension.  CT ANGIOGRAPHY CHEST  Technique:  Multidetector CT imaging of the chest using the standard protocol during bolus administration of intravenous contrast.  Multiplanar reconstructed images including MIPs were obtained and reviewed to evaluate the vascular anatomy.  Contrast: 80mL OMNIPAQUE IOHEXOL 350 MG/ML SOLN  Comparison: Chest x-ray 01/12/2013.  Findings: Marked cardiac enlargement, suspect cardiomyopathy.  No pulmonary embolic disease.  No pleural or pericardial effusion. No hilar or mediastinal adenopathy.  No pulmonary edema. Normal appearing thoracic and abdominal aorta.  Plate-like atelectasis at  the left base.  No pulmonary nodule.  No osseous lesions.  Slight dextroconvex mid thoracic scoliosis.  No rib abnormality. Extrathoracic soft tissues unremarkable. Unremarkable appearing upper abdominal organs.  Trachea midline. Marked diffuse thyroid enlargement, likely goiter.  IMPRESSION: Marked cardiac enlargement, suspect cardiomyopathy.  No pulmonary embolic disease.  No active pulmonary infiltrates or failure.  Marked diffuse thyroid enlargement.   Original Report Authenticated By: Davonna Belling, M.D.    Review of systems reveals history of significant recent weight loss.  She has also had weakness. Blood pressure 122/85, pulse 122, temperature 98.1 F (36.7 C), temperature source Oral, resp. rate 24, height 5\' 5"  (1.651 m), weight 87 lb 4.8 oz (39.6 kg), last menstrual period 11/20/2012, SpO2 100.00%. The patient appears to be in no distress.  Head and neck exam reveals that the pupils are equal and reactive.  The extraocular movements are full.  The patient appears to have proptosis of her eyelids  There is no scleral icterus.  Mouth and pharynx are benign.  No lymphadenopathy.  No carotid bruits.  The jugular venous pressure is normal.  Thyroid is mildly diffusely enlarged.  Chest is clear to percussion and auscultation.  No rales or rhonchi.  Expansion of the chest is symmetrical.  Heart reveals a very prominent S3 gallop.  The abdomen is soft and nontender.  Bowel sounds are normoactive.  There is no hepatosplenomegaly or mass.  There are no  abdominal bruits.  Extremities reveal no phlebitis or edema.  Pedal pulses are good.  There is no cyanosis or clubbing.  Neurologic exam reveals questionable proximal muscle weakness.  Integument reveals no rash.  Her skin is very smooth.  EKG reveals sinus tachycardia, LVH by voltage, benign early repolarization changes.  Assessment/Plan: 1. Hyperthyroidism 2. left ventricular systolic dysfunction with reported ejection fraction in the 30-35% range secondary to high output heart failure from hyperthyroidism 3. history of cocaine abuse 4. marked weight loss and cachexia consistent with hyperthyroidism  Plan: Will start her on beta blocker.  Will start on propranolol 20 mg every 6 hours with parameters and titrate as necessary Treatment of underlying hyperthyroidism as per primary service. Will follow with you  Cassell Clement 01/12/2013, 12:18 PM

## 2013-01-12 NOTE — H&P (Addendum)
[facility] History and Physical  Mary Frey ZOX:[id number] DOB: 12/31/1975 DOA: 01/12/2013  Referring physician: ER physician. PCP: Provider Not In System  Chief Complaint: Cocaine detox.  HPI: Mary Frey is a 37 y.o. female with known history of hypertension presently on no medications, cocaine abuse presents to the ER as patient wants to be detoxed from cocaine. In the ER patient was found to be in sinus tachycardia with rates around 130. On further questioning patient states that she's been having her heart rates for last few months and also has experienced shortness of breath certainly is on exertion with pedal edema. Denies any orthopnea or paroxysmal nocturnal dyspnea. Denies any chest pain productive cough fever chills. In the ER chest x-ray shows cardiomegaly and EKG shows sinus tachycardia with LVH features an elevated BNP. Patient was given one dose of Lasix 40 mg IV. And has been admitted for further management. The last dose of cocaine was taken yesterday. In the ER patient started developing nausea but did not vomit and also is complaining of epigastric discomfort.  Review of Systems: As presented in the history of presenting illness, rest negative.  Past Medical History  Diagnosis Date  . Chronic back pain   . Hypertension   . Pregnancy induced hypertension   . Anemia    Past Surgical History  Procedure Laterality Date  . No past surgeries     Social History:  reports that she has been smoking Cigarettes.  She has been smoking about 0.00 packs per day for the past 14 years. She does not have any smokeless tobacco history on file. She reports that  drinks alcohol. She reports that she uses illicit drugs (Cocaine). Home. where does patient live-- Yes Can patient participate in ADLs?  Allergies  Allergen Reactions  . Hydrocodone Nausea And Vomiting  . Latex Itching and Rash    Family History  Problem Relation Age of Onset  . Diabetes Maternal Aunt    . Diabetes Maternal Uncle   . Diabetes Maternal Grandmother   . Diabetes Maternal Grandfather   . Stroke Paternal Grandfather       Prior to Admission medications   Not on File   Physical Exam: Filed Vitals:   01/12/13 0230 01/12/13 0336 01/12/13 0537 01/12/13 0600  BP:  119/80 130/95 116/72  Pulse:   132 130  Temp:  98.1 F (36.7 C) 98.3 F (36.8 C) 98.1 F (36.7 C)  TempSrc:  Oral Oral Oral  Resp: 24 24 20 24   Height:    5\' 5"  (1.651 m)  Weight:    39.6 kg (87 lb 4.8 oz)  SpO2:  100% 97% 100%     General:  Well-developed and poorly nourished.  Eyes: Anicteric no pallor.  ENT: No discharge from the ears eyes nose mouth.  Neck: No mass felt.  Cardiovascular: S1-S2 heard tachycardic.  Respiratory: No rhonchi or crepitations.  Abdomen: Soft nontender bowel sounds present.  Skin: No rash.  Musculoskeletal: No edema.  Psychiatric: Appears normal.  Neurologic: Alert and oriented to time place and person. Moves all extremities.  Labs on Admission:  Basic Metabolic Panel:  Recent Labs Lab 01/12/13 0246  NA 133*  K 5.0  CL 100  CO2 24  GLUCOSE 90  BUN 15  CREATININE 0.34*  CALCIUM 9.3   Liver Function Tests: No results found for this basename: AST, ALT, ALKPHOS, BILITOT, PROT, ALBUMIN,  in the last 168 hours No results found for this basename: LIPASE, AMYLASE,  in  the last 168 hours No results found for this basename: AMMONIA,  in the last 168 hours CBC:  Recent Labs Lab 01/12/13 0246  WBC 5.4  HGB 10.4*  HCT 29.8*  MCV 68.7*  PLT 152   Cardiac Enzymes:  Recent Labs Lab 01/12/13 0303  TROPONINI <0.30    BNP (last 3 results)  Recent Labs  01/12/13 0304  PROBNP 4083.0*   CBG: No results found for this basename: GLUCAP,  in the last 168 hours  Radiological Exams on Admission: Dg Chest 2 View  01/12/2013   CLINICAL DATA:  Tachycardia. Chest palpitations.  EXAM: CHEST  2 VIEW  COMPARISON:  None.  FINDINGS: There is cardiomegaly  without edema. Lungs are clear. No pneumothorax or pleural fluid.  IMPRESSION: Cardiomegaly without acute disease.   Electronically Signed   By: Drusilla Kanner M.D.   On: 01/12/2013 03:29    EKG: Independently reviewed. Sinus tachycardia LVH features. Patient does have some ST-T changes in the anterior leads probably secondary to LVH changes. Patient does not have any chest pain and troponins have been negative.  Assessment/Plan Principal Problem:   CHF (congestive heart failure) Active Problems:   Cocaine abuse   #1. CHF - patient's symptoms are concerning for CHF. Differentials would include takotsubo cardiomyopathy. Check 2-D echo cycle cardiac markers. At this time I have not placed patient on beta blockers due to cocaine abuse. Patient did receive one dose of IV Lasix further doses to be decided based on patient's response clinical need. #2. Sinus tachycardia - probably secondary to #1. In addition check thyroid function test to make sure patient is not having hyperthyroidism. Check d-dimer. #3. Cocaine abuse - social work consult requested. Patient has been placed on when necessary Ativan. #4. Nausea and vomiting with epigastric discomfort - at this time I have ordered LFTs and lipase and sonogram of abdomen. Cycle cardiac markers. Check thyroid function test to rule out hypothyroidism. #5. Anemia - check anemia panel. #6. History of hypertension - patient did receive one dose of Lasix based on response further medications to be decided.  Repeat EKG when heart rate improves.  Code Status: Full code.  Family Communication: None.  Disposition Plan: Admit to inpatient.    KAKRAKANDY,ARSHAD N. [facility] Pager [phone].  If 7PM-7AM, please contact night-coverage [email or web address] Password [facility] 01/12/2013, 6:54 AM

## 2013-01-12 NOTE — ED Provider Notes (Addendum)
CSN: [id number]     Arrival date & time 01/11/13  2208 History   First MD Initiated Contact with Patient 01/12/13 0056     Chief Complaint  Patient presents with  . Dehydration    HPI Patient presents to the emergency department requesting detox from cocaine.  She's not have a resting heart rate 133.  She states she's had palpitations and racing heart beat for the past several months.  She states she has significant shortness of breath with exertion which is worsened over the past year.  She's never been told that she has a high heart rate before.  She reports generalized fatigue and malaise.  She feels like she might be dehydrated.  Family reports that she's lost weight over the past several months.  Patient admits to very frequent cocaine use.  Her last use was around 5 PM Monday arrival to the ER.  Symptoms are mild to moderate in severity.  She does report orthopnea and occasional lower extremity edema.  No prior cardiac history.  She's never seen a cardiologist.  She never been told she has heart failure. Past Medical History  Diagnosis Date  . Chronic back pain   . Hypertension   . Pregnancy induced hypertension   . Anemia    Past Surgical History  Procedure Laterality Date  . No past surgeries     Family History  Problem Relation Age of Onset  . Diabetes Maternal Aunt   . Diabetes Maternal Uncle   . Diabetes Maternal Grandmother   . Diabetes Maternal Grandfather   . Stroke Paternal Grandfather    History  Substance Use Topics  . Smoking status: Current Some Day Smoker -- 14 years    Types: Cigarettes  . Smokeless tobacco: Not on file  . Alcohol Use: Yes     Comment: on occasion   OB History   Grav Para Term Preterm Abortions TAB SAB Ect Mult Living   5 3 3  2 1  1  3      Review of Systems  All other systems reviewed and are negative.    Allergies  Hydrocodone and Latex  Home Medications  No current outpatient prescriptions on file. BP 119/80  Pulse 125   Temp(Src) 98.1 F (36.7 C) (Oral)  Resp 24  Ht 5\' 5"  (1.651 m)  SpO2 100%  LMP 11/20/2012 Physical Exam  Nursing note and vitals reviewed. Constitutional: She is oriented to person, place, and time. She appears well-developed and well-nourished. No distress.  HENT:  Head: Normocephalic and atraumatic.  Eyes: EOM are normal.  Neck: Normal range of motion.  Cardiovascular: Regular rhythm, normal heart sounds and intact distal pulses.   Tachycardia  Pulmonary/Chest: Effort normal and breath sounds normal.  Abdominal: Soft. She exhibits no distension. There is no tenderness.  Musculoskeletal: Normal range of motion.  Neurological: She is alert and oriented to person, place, and time.  Skin: Skin is warm and dry.  Psychiatric: She has a normal mood and affect. Judgment normal.    ED Course  Procedures (including critical care time)  ECG interpretation  Date: 01/12/2013  Rate: 133  Rhythm: Sinus tachycardia  QRS Axis: normal  Intervals: normal  ST/T Wave abnormalities: normal  Conduction Disutrbances: none  Narrative Interpretation: LVH  Old EKG Reviewed: No prior EKG available     Labs Review Labs Reviewed  CBC - Abnormal; Notable for the following:    Hemoglobin 10.4 (*)    HCT 29.8 (*)    MCV  68.7 (*)    MCH 24.0 (*)    RDW 21.3 (*)    All other components within normal limits  BASIC METABOLIC PANEL - Abnormal; Notable for the following:    Sodium 133 (*)    Creatinine, Ser 0.34 (*)    All other components within normal limits  URINALYSIS, ROUTINE W REFLEX MICROSCOPIC - Abnormal; Notable for the following:    Color, Urine ORANGE (*)    APPearance CLOUDY (*)    Bilirubin Urine MODERATE (*)    Protein, ur 30 (*)    Urobilinogen, UA 4.0 (*)    Leukocytes, UA SMALL (*)    All other components within normal limits  PRO B NATRIURETIC PEPTIDE - Abnormal; Notable for the following:    Pro B Natriuretic peptide (BNP) 4083.0 (*)    All other components within normal  limits  URINE MICROSCOPIC-ADD ON - Abnormal; Notable for the following:    Squamous Epithelial / LPF FEW (*)    All other components within normal limits  PREGNANCY, URINE  TROPONIN I  TSH  T4, FREE   Imaging Review Dg Chest 2 View  01/12/2013   CLINICAL DATA:  Tachycardia. Chest palpitations.  EXAM: CHEST  2 VIEW  COMPARISON:  None.  FINDINGS: There is cardiomegaly without edema. Lungs are clear. No pneumothorax or pleural fluid.  IMPRESSION: Cardiomegaly without acute disease.   Electronically Signed   By: Drusilla Kanner M.D.   On: 01/12/2013 03:29  I personally reviewed the imaging tests through PACS system I reviewed available ER/hospitalization records through the EMR    MDM   1. CHF (congestive heart failure)   2. 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  She has cardiomegaly on chest x-ray.  EKG demonstrated sinus tachycardia 133 with LVH.  BNP is elevated.  Patient would benefit from aggressive medical management and echocardiogram in the morning.  Patient will be admitted    Lyanne Co, MD 01/12/13 1610  Lyanne Co, MD 01/12/13 [phone]

## 2013-01-12 NOTE — Progress Notes (Signed)
  Echocardiogram 2D Echocardiogram has been performed.  Jorje Guild 01/12/2013, 9:38 AM

## 2013-01-12 NOTE — Progress Notes (Signed)
[facility] PROGRESS NOTE  Mary Frey WJX:[id number] DOB: 07/22/1975 DOA: 01/12/2013 PCP: Provider Not In System  HPI/Brief narrative 37 year old female patient with history of HTN, anemia, substance abuse-cocaine & tobacco presented to the ED on 01/12/13 wanting to be detox from cocaine. She denies prior history of thyroid disease. She claims that her nasal twang/low tone voice and dropping left upper eyelid are chronic for years. She complained of some palpitations and DOE in the ED but denies now. Denies chest pain, heat or cold intolerance. Claims to have lost approximately 10 pounds over the last 1 month. In the ED, sinus tachycardia in the 130s, chest x-ray showed cardiomegaly, EKG: Sinus tachycardia with LVH features & elevated BNP. In the ED she also had an episode of nausea and some abdominal discomfort. She was admitted for further evaluation and management.  Assessment/Plan:  Hyperthyroidism - Patient has goiter, left partial ptosis, nasal twang, cachexia and possible proximal muscle weakness. - She has resting tachycardia in the 130s. - TSH <0.008, free T4: 8.45 & free T3 > 20 - Agree with propranolol 20 mg by mouth every 6 hours, started by cardiology. Titrate up as needed. Patient gives history of cocaine use but UDS negative. Discussed with cardiology: since patient is symptomatic of her tachycardia and UDS was negative, no chest pain, continue trial of propranolol and counsel cessation. - Check TSI - Discussed with [facility], Dr. Ernest Haber who recommends starting methimazole 20 mg by mouth 3 times a day.  - Followup CBC for leukopenia if she has sore throat or fevers - Follow LFTs (mildly abnormal LFTs could be secondary to hyperthyroidism) - Patient advised not to become pregnant/contraception, while on methimazole and is to discuss with M.D. prior to contemplating pregnancy. She verbalizes understanding. - Outpatient followup with Dr. Ernest Haber in 3 weeks (she may consider further imaging as outpatient.) - Continue to monitor on telemetry.  Acute systolic/high-output heart failure/cardiomyopathy - Secondary to hyperthyroidism and tachycardia - [facility] cardiology consultation appreciated. - Followup 2-D echo: Preliminary report LVEF 30-35%. - Lasix when necessary. - CTA: No PE, infiltrates or heart failure.  Hypertension - Controlled  Nausea and epigastric discomfort - Possibly related to problem #1 - Mildly elevated LFTs and normal lipase. - Resolved - Follow LFTs in a.m. - Followup abdominal ultrasound  Microcytic Anemia - Probably chronic - Follow CBC & check anemia panel   Polysubstance abuse-tobacco & cocaine - Although she clearly states that she abuses cocaine regularly, her UDS is negative-unclear - cessation counseled. Advised her that she could have very dangerous consequences if she continues to abuse cocaine while she is on medications for her thyroid condition. She states that she has quit since this admission and will not do cocaine anymore. - States she drinks a beer occasionally - States she snorts cocaine regularly and smokes 4-5 cigarettes couple times a week.  Severe protein calorie malnutrition - Secondary to problem #1 - Management per dietitian.   DVT prophylaxis: SCD's Lines/catheters: PIV  Nutrition: Regular  Activity:  Up with assistance Code Status: Full Family Communication: None Disposition Plan: Home when medically stable   Consultants:  [place] [facility] cardiology  Discussed with [facility] endocrinology  Procedures:  None  Antibiotics:  None   Subjective: This morning denied palpitations, chest pain, dyspnea, nausea, vomiting or abdominal pain.  Objective: Filed Vitals:   01/12/13 0725 01/12/13 1017 01/12/13 1200 01/12/13 1400  BP: 122/85  115/72 117/72  Pulse: 147 122 115 117  Temp:    98.2  F (36.8 C)  TempSrc:    Oral  Resp:    22  Height:      Weight:       SpO2:    100%    Intake/Output Summary (Last 24 hours) at 01/12/13 1514 Last data filed at 01/12/13 1401  Gross per 24 hour  Intake    240 ml  Output    900 ml  Net   -660 ml   Filed Weights   01/12/13 0600  Weight: 39.6 kg (87 lb 4.8 oz)     Exam:  General exam: Moderately built and cachectic young female, chronically ill-looking lying comfortably supine in bed. HEENT: Left partial ptosis. No lid lag or lid traction. Pupils equally reacting to light and accommodation. Mild goiter. Respiratory system: Clear. No increased work of breathing. Cardiovascular system: S1 & S2 heard, regular tachycardia. No JVD, murmurs,clicks or pedal edema. S3 gallop + Gastrointestinal system: Abdomen is nondistended, soft and nontender. Normal bowel sounds heard. Central nervous system: Alert and oriented. No focal neurological deficits. Extremities: Symmetric 5 x 5 power distally & 4+ by 5 power proximally.   Data Reviewed: Basic Metabolic Panel:  Recent Labs Lab 01/12/13 0246 01/12/13 0655  NA 133* 135  K 5.0 4.0  CL 100 101  CO2 24 24  GLUCOSE 90 147*  BUN 15 13  CREATININE 0.34* 0.39*  CALCIUM 9.3 8.9  MG  --  1.6  PHOS  --  4.2   Liver Function Tests:  Recent Labs Lab 01/12/13 0655  AST 90*  ALT 40*  ALKPHOS 179*  BILITOT 4.7*  PROT 7.8  ALBUMIN 2.8*    Recent Labs Lab 01/12/13 0655  LIPASE 50   No results found for this basename: AMMONIA,  in the last 168 hours CBC:  Recent Labs Lab 01/12/13 0246  WBC 5.4  HGB 10.4*  HCT 29.8*  MCV 68.7*  PLT 152   Cardiac Enzymes:  Recent Labs Lab 01/12/13 0303 01/12/13 0655  TROPONINI <0.30 <0.30   BNP (last 3 results)  Recent Labs  01/12/13 0304  PROBNP 4083.0*   CBG: No results found for this basename: GLUCAP,  in the last 168 hours  No results found for this or any previous visit (from the past 240 hour(s)).    Additional labs: 1. Anemia panel: Iron 19 9, TIBC 243, ferritin 42, folate 18.3 and  B12: 402. Reticulocytes 96 2. D-dimer: 1.15 3. TFTs: TSH <0.008, free T3: 8.45, free T3 >20 4. Urine pregnancy test: Negative 5. UDS: Negative     Studies: Dg Chest 2 View  01/12/2013   CLINICAL DATA:  Tachycardia. Chest palpitations.  EXAM: CHEST  2 VIEW  COMPARISON:  None.  FINDINGS: There is cardiomegaly without edema. Lungs are clear. No pneumothorax or pleural fluid.  IMPRESSION: Cardiomegaly without acute disease.   Electronically Signed   By: Drusilla Kanner M.D.   On: 01/12/2013 03:29   Ct Angio Chest Pe W/cm &/or Wo Cm  01/12/2013   *RADIOLOGY REPORT*  Clinical Data: Tachycardia.  Positive D-dimer. Cocaine abuse. Hypertension.  CT ANGIOGRAPHY CHEST  Technique:  Multidetector CT imaging of the chest using the standard protocol during bolus administration of intravenous contrast. Multiplanar reconstructed images including MIPs were obtained and reviewed to evaluate the vascular anatomy.  Contrast: 80mL OMNIPAQUE IOHEXOL 350 MG/ML SOLN  Comparison: Chest x-ray 01/12/2013.  Findings: Marked cardiac enlargement, suspect cardiomyopathy.  No pulmonary embolic disease.  No pleural or pericardial effusion. No hilar or mediastinal adenopathy.  No pulmonary edema. Normal appearing thoracic and abdominal aorta.  Plate-like atelectasis at the left base.  No pulmonary nodule.  No osseous lesions.  Slight dextroconvex mid thoracic scoliosis.  No rib abnormality. Extrathoracic soft tissues unremarkable. Unremarkable appearing upper abdominal organs.  Trachea midline. Marked diffuse thyroid enlargement, likely goiter.  IMPRESSION: Marked cardiac enlargement, suspect cardiomyopathy.  No pulmonary embolic disease.  No active pulmonary infiltrates or failure.  Marked diffuse thyroid enlargement.   Original Report Authenticated By: Davonna Belling, M.D.        Scheduled Meds: . aspirin EC  325 mg Oral Daily  . feeding supplement  1 Container Oral BID BM  . folic acid  1 mg Oral Daily  . methimazole  20 mg  Oral TID  . multivitamin with minerals  1 tablet Oral Daily  . propranolol  20 mg Oral QID  . sodium chloride  3 mL Intravenous Q12H  . sodium chloride  3 mL Intravenous Q12H  . thiamine  100 mg Oral Daily   Or  . thiamine  100 mg Intravenous Daily   Continuous Infusions:   Principal Problem:   CHF (congestive heart failure) Active Problems:   Cocaine abuse   Protein-calorie malnutrition, severe    Time spent: 50 minutes.    [facility]  [facility] Pager [phone].   If 8PM-8AM, please contact night-coverage at [email or web address], password [facility] 01/12/2013, 3:14 PM  LOS: 0 days

## 2013-01-12 NOTE — Progress Notes (Signed)
INITIAL NUTRITION ASSESSMENT  DOCUMENTATION CODES Per approved criteria  -Severe  malnutrition in the context of social or environmental circumstances -Underweight   INTERVENTION: Add Ensure Pudding po BID between meals, each supplement provides 170 kcal and 4 grams of protein.  Monitor magnesium, potassium, and phosphorus daily for at least 3 days, MD to replete as needed, as pt is at risk for refeeding syndrome given severe malnutrition. Discussed with Dr. Waymon Amato, will order labs at this time. RD to continue to follow nutrition care plan.  NUTRITION DIAGNOSIS: Malnutrition related to drug abuse as evidenced by severe weight loss and severe muscle/fat mass depletion.   Goal: Intake to meet >90% of estimated nutrition needs.  Monitor:  weight trends, lab trends, I/O's, PO intake, supplement tolerance  Reason for Assessment: Malnutrition Screening Tool  37 y.o. female  Admitting Dx: CHF (congestive heart failure)  ASSESSMENT: PMHx significant for HTN and cocaine abuse. Admitted for cocaine detox. Work-up also receives likely CHF.  Pt is complaining of epigastric discomfort. Unable to obtain any information from patient at this time. She reports that she received a breakfast tray, however she did not eat it.  Nutrition Focused Physical Exam:  Subcutaneous Fat:  Orbital Region: severe depletion Upper Arm Region: severe depletion Thoracic and Lumbar Region: severe depletion  Muscle:  Temple Region: severe depletion Clavicle Bone Region: severe depletion Clavicle and Acromion Bone Region: severe depletion Scapular Bone Region: severe depletion Dorsal Hand: severe depletion Patellar Region: severe depletion Anterior Thigh Region: severe depletion Posterior Calf Region: severe depletion  Edema: none  Pt meets criteria for severe MALNUTRITION in the context of social/environmetnal circumstances as evidenced by severe muscle and fat mass loss, likely suspected intake of  <75% x at least 1 month, and 24% wt loss x 1 year.  Pt is at risk for refeeding syndrome given malnutrition. Potassium is currently WNL, no magnesium or phosphorus is available.  Height: Ht Readings from Last 1 Encounters:  01/12/13 5\' 5"  (1.651 m)    Weight: Wt Readings from Last 1 Encounters:  01/12/13 87 lb 4.8 oz (39.6 kg)    Ideal Body Weight: 125 lb  % Ideal Body Weight: 70%  Wt Readings from Last 10 Encounters:  01/12/13 87 lb 4.8 oz (39.6 kg)  02/11/12 115 lb (52.164 kg)    Usual Body Weight: 115 lb  % Usual Body Weight: 76%  BMI:  Body mass index is 14.53 kg/(m^2). Underweight.  Estimated Nutritional Needs: Kcal: 1200 - 1400 Protein: 48 - 55 Fluid: 1.2 - 1.4 liters  Skin: intact  Diet Order: Cardiac  EDUCATION NEEDS: -No education needs identified at this time   Intake/Output Summary (Last 24 hours) at 01/12/13 0944 Last data filed at 01/12/13 0805  Gross per 24 hour  Intake    240 ml  Output    900 ml  Net   -660 ml    Last BM: PTA  Labs:   Recent Labs Lab 01/12/13 0246 01/12/13 0655  NA 133* 135  K 5.0 4.0  CL 100 101  CO2 24 24  BUN 15 13  CREATININE 0.34* 0.39*  CALCIUM 9.3 8.9  GLUCOSE 90 147*    CBG (last 3)  No results found for this basename: GLUCAP,  in the last 72 hours  Scheduled Meds: . aspirin EC  325 mg Oral Daily  . folic acid  1 mg Oral Daily  . multivitamin with minerals  1 tablet Oral Daily  . sodium chloride  3 mL Intravenous Q12H  .  sodium chloride  3 mL Intravenous Q12H  . thiamine  100 mg Oral Daily   Or  . thiamine  100 mg Intravenous Daily    Continuous Infusions:   Past Medical History  Diagnosis Date  . Chronic back pain   . Hypertension   . Pregnancy induced hypertension   . Anemia     Past Surgical History  Procedure Laterality Date  . No past surgeries      Jarold Motto MS, RD, LDN Pager: [phone] After-hours pager: [phone]

## 2013-01-12 NOTE — ED Notes (Signed)
Per pt report: pt reports taking cocaine for a few years and is asking for help with detox.  Pt also thinks she is dehydrated.  Pt reports abd pain and reports that is normally what happens when pt stops using cocaine.  Pt reports last using cocaine this morning around 5am.  Pt a/o x 4.  Skin warm and dry.

## 2013-01-12 NOTE — Clinical Documentation Improvement (Signed)
THIS DOCUMENT IS NOT A PERMANENT PART OF THE MEDICAL RECORD  Please update your documentation with the medical record to reflect your response to this query. If you need help knowing how to do this please call [phone].  01/12/13   Dear Dr. Waymon Amato, [facility],  In a better effort to capture your patient's severity of illness, reflect appropriate length of stay and utilization of resources, a review of the patient medical record has revealed the following indicators.    Based on your clinical judgment, please clarify and document in a progress note and/or discharge summary the clinical condition associated with the following supporting information:  In responding to this query please exercise your independent judgment.  The fact that a query is asked, does not imply that any particular answer is desired or expected.  Pt w/ severe malnutrition per RN note on 01/12/13  Clarification Needed   Please clarify if you agree pt with "severe malnutrition" or other diagnosis and document in pn or d/c summary     Possible Clinical Conditions?  _______Severe Malnutrition   _______Protein Calorie Malnutrition _______Severe Protein Calorie Malnutrition _______Other Condition________________ _______Cannot clinically determine     Supporting Information: Risk Factors:  Signs & Symptoms:    Nutrition Note 01/12/13 Per approved criteria -Severe malnutrition in the context of social or environmental circumstances -Underweight Pt meets criteria for severe MALNUTRITION in the context of social/environmetnal circumstances as evidenced by severe muscle and fat mass loss, likely suspected intake of <75% x at least 1 month, and 24% wt loss x 1 year    : Ht Readings from Last 1 Encounters:   01/12/13  5\' 5"  (1.651 m)     Weight: Wt Readings from Last 1 Encounters:   01/12/13  87 lb 4.8 oz (39.6 kg)   BMI: Body mass index is 14.53 kg/(m^2). Underweight.   Ideal Body Weight:  125 lb  % Ideal Body Weight: 70%  Wt Readings from Last 10 Encounters:   01/12/13  87 lb 4.8 oz (39.6 kg)    Treatment   Add Ensure Pudding po BID between meals  Monitor magnesium, potassium, and phosphorus daily for at least 3 days  weight trends, lab trends, I/O's, PO intake, supplement tolerance    You may use possible, probable, or suspect with inpatient documentation. possible, probable, suspected diagnoses MUST be documented at the time of discharge  Reviewed: additional documentation in the medical record ljh  Thank You,  Enis Slipper  RN, BSN, MSN/Inf, CCDS Clinical Documentation Specialist [facility] Dept Pager: [phone] / E-mail: [email or web address]  [phone] Health Information Management [place]

## 2013-01-12 NOTE — Care Management Note (Addendum)
    Page 1 of 1   01/14/2013     11:11:03 AM   CARE MANAGEMENT NOTE 01/14/2013  Patient:  Mary Frey, Mary Frey   Account Number:  [id number]  Date Initiated:  01/12/2013  Documentation initiated by:  Lanier Clam  Subjective/Objective Assessment:   37 Y/O F ADMITTED W/[id number] ABUSE.     Action/Plan:   FROM HOME.HAS PCP,PHARMACY.   Anticipated DC Date:  01/14/2013   Anticipated DC Plan:  HOME W HOME HEALTH SERVICES      DC Planning Services  CM consult      Choice offered to / List presented to:  C-1 Patient        HH arranged  HH-1 RN  HH-10 DISEASE MANAGEMENT      HH agency  [facility]   Status of service:  Completed, signed off Medicare Important Message given?   (If response is "NO", the following Medicare IM given date fields will be blank) Date Medicare IM given:   Date Additional Medicare IM given:    Discharge Disposition:  HOME W HOME HEALTH SERVICES  Per UR Regulation:  Reviewed for med. necessity/level of care/duration of stay  If discussed at Long Length of Stay Meetings, dates discussed:    Comments:  01/14/13 Eusebio Blazejewski RN,BSN NCM [phone] [facility] CHOSEN FOR HHRN-CHF PROTOCAL.KRISTEN [facility] REP AWARE OF D/C & HHRN ORDER.F/U APPTS IN D/C SECTION.NO FURTHER D/C NEEDS.  01/12/13 Valecia Beske RN,BSN NCM [phone] TELE  MONITORING.CARDIO FOLLOWING.

## 2013-01-13 DIAGNOSIS — I10 Essential (primary) hypertension: Secondary | ICD-10-CM

## 2013-01-13 DIAGNOSIS — I428 Other cardiomyopathies: Secondary | ICD-10-CM

## 2013-01-13 DIAGNOSIS — E0590 Thyrotoxicosis, unspecified without thyrotoxic crisis or storm: Secondary | ICD-10-CM

## 2013-01-13 DIAGNOSIS — I5021 Acute systolic (congestive) heart failure: Principal | ICD-10-CM

## 2013-01-13 LAB — CBC
HCT: 27.8 % — ABNORMAL LOW (ref 36.0–46.0)
MCH: 24.3 pg — ABNORMAL LOW (ref 26.0–34.0)
MCHC: 36 g/dL (ref 30.0–36.0)
MCV: 67.6 fL — ABNORMAL LOW (ref 78.0–100.0)
Platelets: 125 10*3/uL — ABNORMAL LOW (ref 150–400)
RBC: 4.11 MIL/uL (ref 3.87–5.11)
RDW: 20.9 % — ABNORMAL HIGH (ref 11.5–15.5)
WBC: 4.9 10*3/uL (ref 4.0–10.5)

## 2013-01-13 LAB — COMPREHENSIVE METABOLIC PANEL
Albumin: 2.6 g/dL — ABNORMAL LOW (ref 3.5–5.2)
Alkaline Phosphatase: 165 U/L — ABNORMAL HIGH (ref 39–117)
BUN: 12 mg/dL (ref 6–23)
CO2: 25 mEq/L (ref 19–32)
Chloride: 105 mEq/L (ref 96–112)
Creatinine, Ser: 0.37 mg/dL — ABNORMAL LOW (ref 0.50–1.10)
GFR calc Af Amer: >90 mL/min (ref >90)
Glucose, Bld: 88 mg/dL (ref 70–99)
Sodium: 138 mEq/L (ref 135–145)
Total Bilirubin: 3.7 mg/dL — ABNORMAL HIGH (ref 0.3–1.2)
Total Protein: 7.3 g/dL (ref 6.0–8.3)

## 2013-01-13 LAB — MAGNESIUM: Magnesium: 1.7 mg/dL (ref 1.5–2.5)

## 2013-01-13 MED ORDER — ENSURE COMPLETE PO LIQD
237.0000 mL | ORAL | Status: DC
  Administered 2013-01-13 – 2013-01-14 (×2): 237 mL via ORAL

## 2013-01-13 NOTE — Progress Notes (Signed)
[facility] PROGRESS NOTE  KRYSTALE RINKENBERGER ZOX:[id number] DOB: 1976-03-07 DOA: 01/12/2013 PCP: Provider Not In System  HPI/Brief narrative 37 year old female patient with history of HTN, anemia, substance abuse-cocaine & tobacco presented to the ED on 01/12/13 wanting to be detox from cocaine. She denies prior history of thyroid disease. She claims that her nasal twang/low tone voice and dropping left upper eyelid are chronic for years. She complained of some palpitations and DOE in the ED but denies now. Denies chest pain, heat or cold intolerance. Claims to have lost approximately 10 pounds over the last 1 month. In the ED, sinus tachycardia in the 130s, chest x-ray showed cardiomegaly, EKG: Sinus tachycardia with LVH features & elevated BNP. In the ED she also had an episode of nausea and some abdominal discomfort. She was admitted for further evaluation and management.  Assessment/Plan:  Hyperthyroidism - Patient has goiter, left partial ptosis, nasal twang, cachexia and possible proximal muscle weakness. - She has resting tachycardia in the 130s. - TSH <0.008, free T4: 8.45 & free T3 > 20 - Agree with propranolol 20 mg by mouth every 6 hours, started by cardiology. Titrate up as needed. Patient gives history of cocaine use but UDS negative. Discussed with cardiology: since patient is symptomatic of her tachycardia and UDS was negative, no chest pain, continue trial of propranolol and counsel cessation. - Check TSI - Discussed with [place] [facility], Dr. Ernest Haber who recommends starting methimazole 20 mg by mouth 3 times a day.  - Followup CBC for leukopenia if she has sore throat or fevers - Follow LFTs (mildly abnormal LFTs could be secondary to hyperthyroidism) - Patient advised not to become pregnant/contraception, while on methimazole and is to discuss with M.D. prior to contemplating pregnancy. She verbalizes understanding. - Outpatient followup with Dr. Ernest Haber in 3 weeks (she may consider further imaging as outpatient.) - Continue to monitor on telemetry.  Acute systolic/high-output heart failure/cardiomyopathy - Secondary to hyperthyroidism and tachycardia and cocaine use. [facility] consultation appreciated. - Followup 2-D echo: Preliminary report LVEF 30-35%. - Lasix when necessary. - CTA: No PE, infiltrates or heart failure.  Hypertension - Controlled  Nausea and epigastric discomfort - Possibly related to problem #1 - Mildly elevated LFTs and normal lipase. - Resolved - Follow LFTs in a.m. - Followup abdominal ultrasound  Microcytic Anemia - Probably chronic - Follow CBC & check anemia panel   Polysubstance abuse-tobacco & cocaine - Although she clearly states that she abuses cocaine regularly, her UDS is negative-unclear - cessation counseled. Advised her that she could have very dangerous consequences if she continues to abuse cocaine while she is on medications for her thyroid condition. She states that she has quit since this admission and will not do cocaine anymore. - States she drinks a beer occasionally - States she snorts cocaine regularly and smokes 4-5 cigarettes couple times a week.  Severe protein calorie malnutrition - Secondary to problem #1 - Management per dietitian.   DVT prophylaxis: SCD's Lines/catheters: PIV  Nutrition: Regular  Activity:  Up with assistance Code Status: Full Family Communication: None Disposition Plan: Home when medically stable, anticipate 24-48 hours.   Consultants:  [facility]  Discussed with [facility] endocrinology  Procedures:  None  Antibiotics:  None   Subjective: This morning denied palpitations, chest pain, dyspnea, nausea, vomiting or abdominal pain.  Objective: Filed Vitals:   01/13/13 0655 01/13/13 1117 01/13/13 1200 01/13/13 1406  BP: 114/67 95/66 139/110 110/66  Pulse: 96  103 104  Temp: 97.8 F (36.6 C)   98 F (36.7 C)   TempSrc: Oral   Oral  Resp: 16   18  Height:      Weight:      SpO2: 100%   100%    Intake/Output Summary (Last 24 hours) at 01/13/13 1447 Last data filed at 01/13/13 1300  Gross per 24 hour  Intake    563 ml  Output    200 ml  Net    363 ml   Filed Weights   01/12/13 0600 01/13/13 0500  Weight: 39.6 kg (87 lb 4.8 oz) 38.51 kg (84 lb 14.4 oz)     Exam:  General exam: Moderately built and cachectic young female, chronically ill-looking lying comfortably supine in bed. HEENT: Left partial ptosis. No lid lag or lid traction. Pupils equally reacting to light and accommodation. Mild goiter. Respiratory system: Clear. No increased work of breathing. Cardiovascular system: S1 & S2 heard, regular tachycardia. No JVD, murmurs,clicks or pedal edema. S3 gallop + Gastrointestinal system: Abdomen is nondistended, soft and nontender. Normal bowel sounds heard. Central nervous system: Alert and oriented. No focal neurological deficits. Extremities: Symmetric 5 x 5 power distally & 4+ by 5 power proximally.   Data Reviewed: Basic Metabolic Panel:  Recent Labs Lab 01/12/13 0246 01/12/13 0655 01/13/13 0440  NA 133* 135 138  K 5.0 4.0 4.0  CL 100 101 105  CO2 24 24 25   GLUCOSE 90 147* 88  BUN 15 13 12   CREATININE 0.34* 0.39* 0.37*  CALCIUM 9.3 8.9 9.4  MG  --  1.6 1.7  PHOS  --  4.2 4.2   Liver Function Tests:  Recent Labs Lab 01/12/13 0655 01/13/13 0440  AST 90* 72*  ALT 40* 33  ALKPHOS 179* 165*  BILITOT 4.7* 3.7*  PROT 7.8 7.3  ALBUMIN 2.8* 2.6*    Recent Labs Lab 01/12/13 0655  LIPASE 50   No results found for this basename: AMMONIA,  in the last 168 hours CBC:  Recent Labs Lab 01/12/13 0246 01/13/13 0440  WBC 5.4 4.9  HGB 10.4* 10.0*  HCT 29.8* 27.8*  MCV 68.7* 67.6*  PLT 152 125*   Cardiac Enzymes:  Recent Labs Lab 01/12/13 0303 01/12/13 0655  TROPONINI <0.30 <0.30   BNP (last 3 results)  Recent Labs  01/12/13 0304  PROBNP 4083.0*    CBG: No results found for this basename: GLUCAP,  in the last 168 hours  No results found for this or any previous visit (from the past 240 hour(s)).    Additional labs: 1. Anemia panel: Iron 19 9, TIBC 243, ferritin 42, folate 18.3 and B12: 402. Reticulocytes 96 2. D-dimer: 1.15 3. TFTs: TSH <0.008, free T3: 8.45, free T3 >20 4. Urine pregnancy test: Negative 5. UDS: Negative     Studies: Dg Chest 2 View  01/12/2013   CLINICAL DATA:  Tachycardia. Chest palpitations.  EXAM: CHEST  2 VIEW  COMPARISON:  None.  FINDINGS: There is cardiomegaly without edema. Lungs are clear. No pneumothorax or pleural fluid.  IMPRESSION: Cardiomegaly without acute disease.   Electronically Signed   By: Drusilla Kanner M.D.   On: 01/12/2013 03:29   Ct Angio Chest Pe W/cm &/or Wo Cm  01/12/2013   *RADIOLOGY REPORT*  Clinical Data: Tachycardia.  Positive D-dimer. Cocaine abuse. Hypertension.  CT ANGIOGRAPHY CHEST  Technique:  Multidetector CT imaging of the chest using the standard protocol during bolus administration of intravenous contrast. Multiplanar reconstructed images including MIPs were obtained  and reviewed to evaluate the vascular anatomy.  Contrast: 80mL OMNIPAQUE IOHEXOL 350 MG/ML SOLN  Comparison: Chest x-ray 01/12/2013.  Findings: Marked cardiac enlargement, suspect cardiomyopathy.  No pulmonary embolic disease.  No pleural or pericardial effusion. No hilar or mediastinal adenopathy.  No pulmonary edema. Normal appearing thoracic and abdominal aorta.  Plate-like atelectasis at the left base.  No pulmonary nodule.  No osseous lesions.  Slight dextroconvex mid thoracic scoliosis.  No rib abnormality. Extrathoracic soft tissues unremarkable. Unremarkable appearing upper abdominal organs.  Trachea midline. Marked diffuse thyroid enlargement, likely goiter.  IMPRESSION: Marked cardiac enlargement, suspect cardiomyopathy.  No pulmonary embolic disease.  No active pulmonary infiltrates or failure.  Marked  diffuse thyroid enlargement.   Original Report Authenticated By: Davonna Belling, M.D.   US Abdomen Complete  01/12/2013   *RADIOLOGY REPORT*  Clinical Data:  History of nausea and epigastric pain.  History of hypertension.  ABDOMINAL ULTRASOUND COMPLETE  Comparison:  None  Findings:  Gallbladder: No shadowing gallstones or echogenic sludge. No gallbladder wall thickening or pericholecystic fluid. The gallbladder wall thickness measured 2.7 mm. No sonographic Murphy's sign according to the ultrasound technologist.  CBD: Normal in caliber measuring 1.9 mm. No choledocholithiasis is evident.  Liver:  Normal size and echotexture without focal parenchymal abnormality. Portal vein is patent with hepatopetal flow.  IVC:  Patent throughout its visualized course in the abdomen.  Pancreas:  Although the pancreas is difficult to visualize in its entirety, no focal pancreatic abnormality is identified.  Spleen:  Normal size and echotexture without focal abnormality. Splenic length is 9 cm.  Right kidney:  No hydronephrosis.  Well-preserved cortex.  Normal parenchymal echotexture. There is a hypoechoic area involving the lower pole of the right kidney.  There is posterior acoustic enhancement consistent with a cystic lesion.  There are some low intensity internal echoes which may reflect artifact or debris. This area measures 2 x 1.7 x 1.1 cm. There is no internal color Doppler flow.  No cyst wall thickening or papillary or solid nodular area is seen.  Right renal length is 10.6 cm.  Left kidney:  No hydronephrosis.  Well-preserved cortex.  Normal parenchymal echotexture without focal abnormalities.  Left renal length is 11.7 cm.  Aorta:  Maximum diameter is 2.0 cm.  No aneurysm is evident.  Ascites:  None.  Incidental note is made of a tiny amount of right pleural effusion.  IMPRESSION: Normal appearance of liver, gallbladder, and bile ducts.  Small cystic area involving the lower pole of the right kidney most likely reflects a  small cyst with internal echoes reflecting small amounts of debris or artifact.No solid papillary or nodular area was seen.  There is a tiny amount of right pleural effusion.  No other abnormalities are identified.   Original Report Authenticated By: Onalee Hua Call        Scheduled Meds: . feeding supplement  237 mL Oral Q24H  . folic acid  1 mg Oral Daily  . methimazole  20 mg Oral TID  . multivitamin with minerals  1 tablet Oral Daily  . propranolol  20 mg Oral QID  . sodium chloride  3 mL Intravenous Q12H  . sodium chloride  3 mL Intravenous Q12H  . thiamine  100 mg Oral Daily   Or  . thiamine  100 mg Intravenous Daily   Continuous Infusions:   Principal Problem:   Hyperthyroidism Active Problems:   Cocaine abuse   Protein-calorie malnutrition, severe   Acute systolic CHF (congestive heart failure)  HTN (hypertension)   Microcytic anemia   Tobacco use disorder    Time spent: 35 minutes.    [facility]  [facility] Pager [phone].   If 8PM-8AM, please contact night-coverage at [email or web address], password [facility] 01/13/2013, 2:47 PM  LOS: 1 day

## 2013-01-13 NOTE — Progress Notes (Addendum)
    Subjective:  Feels better this am. palps improved. No chest pain.  Objective:  Vital Signs in the last 24 hours: Temp:  [98.2 F (36.8 C)] 98.2 F (36.8 C) (09/24 0023) Pulse Rate:  [97-147] 97 (09/24 0023) Resp:  [20-22] 20 (09/24 0023) BP: (104-122)/(70-85) 104/74 mmHg (09/24 0023) SpO2:  [100 %] 100 % (09/24 0023) Weight:  [84 lb 14.4 oz (38.51 kg)] 84 lb 14.4 oz (38.51 kg) (09/24 0500)  Intake/Output from previous day: 09/23 0701 - 09/24 0700 In: 563 [P.O.:560; I.V.:3] Out: 200 [Urine:200]  Physical Exam: Pt is alert and oriented, frail, thin woman in NAD HEENT: normal Neck: JVP - normal, carotids 2+= without bruits Lungs: CTA bilaterally CV: RRR with 2/6 SEM at LSB Abd: soft, NT, Positive BS Ext: no C/C/E, distal pulses intact and equal Skin: warm/dry no rash   Lab Results:  Recent Labs  01/12/13 0246 01/13/13 0440  WBC 5.4 4.9  HGB 10.4* 10.0*  PLT 152 125*    Recent Labs  01/12/13 0655 01/13/13 0440  NA 135 138  K 4.0 4.0  CL 101 105  CO2 24 25  GLUCOSE 147* 88  BUN 13 12  CREATININE 0.39* 0.37*    Recent Labs  01/12/13 0303 01/12/13 0655  TROPONINI <0.30 <0.30    Cardiac Studies: 2D Echo: Study Conclusions  - Left ventricle: The cavity size was normal. There was mild concentric hypertrophy. Systolic function was severely reduced. The estimated ejection fraction was in the range of 25% to 30%. Severe global hypokinesis with inferior akinesis. LV diastolic function cannot be assessed due to E/A fusion. Ejection fraction: 32.83% (MOD, 1-plane). - Aortic valve: Trileaflet. Mild regurgitation. - Mitral valve: There is tethering of the P2 segment of the posterior leaflet with moderate, posteriorly directedregurgitation. Mild valvular leaflet thickening. - Right ventricle: RV systolic pressure: 42mm Hg (S, est). - Tricuspid valve: Moderate regurgitation. - Inferior vena cava: The IVC measures <2.1 cm but does not collapse,  suggesting an elevated RA Pressure of 10 mmHg. - Pericardium, extracardiac: There was no pericardial effusion.  Tele: Upgrade in process - unable to review  Assessment/Plan:  1. Hyperthyroiidism 2. Cardiomyopathy, presumably nonischemic secondary to #1, cocaine, tachycardia  Plans as per Dr Yevonne Pax consult note. Doing better with addition of propranolol. She understands critical importance of complete cocaine cessation. With low BP and frail body habitus, would avoid multidrug Rx as I don't think she will tolerate (no ACE).   Tonny Bollman, M.D. 01/13/2013, 6:50 AM

## 2013-01-13 NOTE — Progress Notes (Signed)
NUTRITION FOLLOW-UP  DOCUMENTATION CODES Per approved criteria  -Severe  malnutrition in the context of social or environmental circumstances -Underweight   INTERVENTION: Change Ensure Pudding to Ensure Complete daily to provide more kcal/protein. Monitor magnesium, potassium, and phosphorus daily for at least 3 days, MD to replete as needed, as pt is at risk for refeeding syndrome given severe malnutrition.  RD to continue to follow nutrition care plan.  NUTRITION DIAGNOSIS: Malnutrition related to drug abuse as evidenced by severe weight loss and severe muscle/fat mass depletion.  Ongoing.  Goal: Intake to meet >90% of estimated nutrition needs.  Monitor:  weight trends, lab trends, I/O's, PO intake, supplement tolerance  ASSESSMENT: PMHx significant for HTN and cocaine abuse. Admitted for cocaine detox. Work-up also reveals hyperthyroidism.  Pt with hyperthyroidism - consistent with weight loss. Continues on Regular diet at this time. Was NPO for lunch yesterday, but ate 75% of her dinner last night and 50% of her breakfast this morning.  Pt meets criteria for severe MALNUTRITION in the context of social/environmetnal circumstances as evidenced by severe muscle and fat mass loss, likely suspected intake of <75% x at least 1 month, and 24% wt loss x 1 year.  Pt is at risk for refeeding syndrome given malnutrition. Potassium, magnesium and phosphorus are WNL x 2 days.  Height: Ht Readings from Last 1 Encounters:  01/12/13 5\' 5"  (1.651 m)    Weight: Wt Readings from Last 1 Encounters:  01/13/13 84 lb 14.4 oz (38.51 kg)  Wt down 3 lb since admit.  BMI:  Body mass index is 14.13 kg/(m^2). Underweight.  Estimated Nutritional Needs: Kcal: 1200 - 1400 Protein: 48 - 55 Fluid: 1.2 - 1.4 liters  Skin: intact  Diet Order: General  EDUCATION NEEDS: -No education needs identified at this time   Intake/Output Summary (Last 24 hours) at 01/13/13 0845 Last data filed at  01/12/13 2200  Gross per 24 hour  Intake    323 ml  Output    200 ml  Net    123 ml    Last BM: 9/24  Labs:   Recent Labs Lab 01/12/13 0246 01/12/13 0655 01/13/13 0440  NA 133* 135 138  K 5.0 4.0 4.0  CL 100 101 105  CO2 24 24 25   BUN 15 13 12   CREATININE 0.34* 0.39* 0.37*  CALCIUM 9.3 8.9 9.4  MG  --  1.6 1.7  PHOS  --  4.2 4.2  GLUCOSE 90 147* 88    CBG (last 3)  No results found for this basename: GLUCAP,  in the last 72 hours  Scheduled Meds: . feeding supplement  1 Container Oral BID BM  . folic acid  1 mg Oral Daily  . methimazole  20 mg Oral TID  . multivitamin with minerals  1 tablet Oral Daily  . propranolol  20 mg Oral QID  . sodium chloride  3 mL Intravenous Q12H  . sodium chloride  3 mL Intravenous Q12H  . thiamine  100 mg Oral Daily   Or  . thiamine  100 mg Intravenous Daily    Continuous Infusions:  none  Jarold Motto MS, RD, LDN Pager: [phone] After-hours pager: [phone]

## 2013-01-14 LAB — PHOSPHORUS: Phosphorus: 4.1 mg/dL (ref 2.3–4.6)

## 2013-01-14 LAB — MAGNESIUM: Magnesium: 1.7 mg/dL (ref 1.5–2.5)

## 2013-01-14 MED ORDER — METHIMAZOLE 10 MG PO TABS: 20.0000 mg | ORAL_TABLET | Freq: Three times a day (TID) | ORAL | Status: DC

## 2013-01-14 MED ORDER — PROPRANOLOL HCL 20 MG PO TABS: 20.0000 mg | ORAL_TABLET | Freq: Four times a day (QID) | ORAL | Status: DC

## 2013-01-14 MED ORDER — LORAZEPAM 2 MG/ML IJ SOLN
1.0000 mg | Freq: Once | INTRAMUSCULAR | Status: AC
  Administered 2013-01-14: 1 mg via INTRAVENOUS
  Filled 2013-01-14: qty 1

## 2013-01-14 MED ORDER — THIAMINE HCL 100 MG PO TABS: 100.0000 mg | ORAL_TABLET | Freq: Every day | ORAL | Status: DC

## 2013-01-14 MED ORDER — FOLIC ACID 1 MG PO TABS: 1.0000 mg | ORAL_TABLET | Freq: Every day | ORAL | Status: DC

## 2013-01-14 MED ORDER — ADULT MULTIVITAMIN W/MINERALS CH: 1.0000 | ORAL_TABLET | Freq: Every day | ORAL | Status: DC

## 2013-01-14 NOTE — Progress Notes (Signed)
    Subjective:  No CP, dyspnea, or palps this am.  Objective:  Vital Signs in the last 24 hours: Temp:  [97.6 F (36.4 C)-98 F (36.7 C)] 97.6 F (36.4 C) (09/25 0655) Pulse Rate:  [99-104] 102 (09/25 0655) Resp:  [16-18] 16 (09/25 0655) BP: (95-139)/(66-110) 121/85 mmHg (09/25 0655) SpO2:  [100 %] 100 % (09/25 0655) Weight:  [84 lb 14.4 oz (38.51 kg)] 84 lb 14.4 oz (38.51 kg) (09/25 0655)  Intake/Output from previous day: 09/24 0701 - 09/25 0700 In: 360 [P.O.:360] Out: 200 [Urine:200]  Physical Exam: Pt is alert and oriented, frail woman in NAD HEENT: normal Neck: JVP - normal Lungs: CTA bilaterally CV: RRR with grade 2/6 SEM at the LSB and dynamic apical impulse Abd: soft, NT, Positive BS, no hepatomegaly Ext: no C/C/E, distal pulses intact and equal Skin: warm/dry no rash   Lab Results:  Recent Labs  01/12/13 0246 01/13/13 0440  WBC 5.4 4.9  HGB 10.4* 10.0*  PLT 152 125*    Recent Labs  01/12/13 0655 01/13/13 0440  NA 135 138  K 4.0 4.0  CL 101 105  CO2 24 25  GLUCOSE 147* 88  BUN 13 12  CREATININE 0.39* 0.37*    Recent Labs  01/12/13 0303 01/12/13 0655  TROPONINI <0.30 <0.30    Cardiac Studies: No new studies  Tele: Sinus rhythm, no arrhtyhmia  Assessment/Plan:  1. Hyperthyroidism 2. Nonischemic cardiomyopathy  Continue propranolol. Pt has been counseled extensively about cocaine cessation. Follow-up Dr Patty Sermons or PA 2 weeks (will arrange). Should have follow-up echo in 3 months.  Tonny Bollman, M.D. 01/14/2013, 7:29 AM

## 2013-01-14 NOTE — Discharge Summary (Signed)
Physician Discharge Summary  Mary Frey XBJ:[id number] DOB: 1975/06/30 DOA: 01/12/2013  PCP: Provider Not In System  Admit date: 01/12/2013 Discharge date: 01/14/2013  Time spent: 45 minutes  Recommendations for Outpatient Follow-up:  -Advised to follow up with cardiology and endocrinology as scheduled. -Appointments have been provided to the patient.   Discharge Diagnoses:  Principal Problem:   Hyperthyroidism Active Problems:   Cocaine abuse   Protein-calorie malnutrition, severe   Acute systolic CHF (congestive heart failure)   HTN (hypertension)   Microcytic anemia   Tobacco use disorder   Discharge Condition: Stable and improved  Filed Weights   01/12/13 0600 01/13/13 0500 01/14/13 0655  Weight: 39.6 kg (87 lb 4.8 oz) 38.51 kg (84 lb 14.4 oz) 38.51 kg (84 lb 14.4 oz)    History of present illness:  Patient is a 37 y.o. female with known history of hypertension presently on no medications, cocaine abuse presents to the ER as patient wants to be detoxed from cocaine. In the ER patient was found to be in sinus tachycardia with rates around 130. On further questioning patient states that she's been having her heart rates for last few months and also has experienced shortness of breath certainly is on exertion with pedal edema. Denies any orthopnea or paroxysmal nocturnal dyspnea. Denies any chest pain productive cough fever chills. In the ER chest x-ray shows cardiomegaly and EKG shows sinus tachycardia with LVH features an elevated BNP. Patient was given one dose of Lasix 40 mg IV. And has been admitted for further management. The last dose of cocaine was taken yesterday. In the ER patient started developing nausea but did not vomit and also is complaining of epigastric discomfort. We were asked to admit her for further evaluation and management.   Hospital Course:   Hyperthyroidism  - Patient has goiter, left partial ptosis, nasal twang, cachexia and possible proximal  muscle weakness.  - She has resting tachycardia in the 130s.  - TSH <0.008, free T4: 8.45 & free T3 > 20  - Agree with propranolol 20 mg by mouth every 6 hours, started by cardiology. Titrate up as needed. Patient gives history of cocaine use but UDS negative. Discussed with cardiology: since patient is symptomatic of her tachycardia and UDS was negative, no chest pain, continue trial of propranolol and counsel cessation.  - Check TSI  - Discussed with [facility], Dr. Ernest Haber who recommends starting methimazole 20 mg by mouth 3 times a day.  - Followup CBC for leukopenia if she has sore throat or fevers  - Follow LFTs (mildly abnormal LFTs could be secondary to hyperthyroidism)  - Patient advised not to become pregnant/contraception, while on methimazole and is to discuss with M.D. prior to contemplating pregnancy. She verbalizes understanding.  - Outpatient followup with Dr. Ernest Haber in 3 weeks (she may consider further imaging as outpatient.)   Acute systolic/high-output heart failure/cardiomyopathy  - Secondary to hyperthyroidism and tachycardia and cocaine use.  [facility] cardiology consultation appreciated.  - Followup 2-D echo: Preliminary report LVEF 30-35%.  - CTA: No PE, infiltrates or heart failure.  -Meds have not been uptitrated given her cachectic state and thought that she would not be able to tolerate more than 1 BP medication.  Hypertension  - Controlled   Microcytic Anemia  - Probably chronic  - Follow CBC & check anemia panel   Polysubstance abuse-tobacco & cocaine  - She clearly states that she abuses cocaine regularly - cessation counseled. Advised her that she  could have very dangerous consequences if she continues to abuse cocaine while she is on medications for her thyroid condition. She states that she has quit since this admission and will not do cocaine anymore.  - States she drinks a beer occasionally  - States she snorts cocaine  regularly and smokes 4-5 cigarettes couple times a week.  -Advised on cessation. -Has been provided with OP resources for her ETOH and cocaine abuse.  Severe protein calorie malnutrition  - Secondary to problem #1  - Management per dietitian.   Procedures:  None   Consultations:  Cardiology, [facility]  Discharge Instructions  Discharge Orders   Future Appointments Provider Department Dept Phone   02/01/2013 2:15 PM Carlus Pavlov, MD [facility] ENDOCR[facility]OLOG[facility] [phone]   Future Orders Complete By Expires   Diet - low sodium heart healthy  As directed    Discontinue IV  As directed    Increase activity slowly  As directed        Medication List         folic acid 1 MG tablet  Commonly known as:  FOLVITE  Take 1 tablet (1 mg total) by mouth daily.     methimazole 10 MG tablet  Commonly known as:  TAPAZOLE  Take 2 tablets (20 mg total) by mouth 3 (three) times daily.     multivitamin with minerals Tabs tablet  Take 1 tablet by mouth daily.     propranolol 20 MG tablet  Commonly known as:  INDERAL  Take 1 tablet (20 mg total) by mouth 4 (four) times daily.     thiamine 100 MG tablet  Take 1 tablet (100 mg total) by mouth daily.       Allergies  Allergen Reactions  . Hydrocodone Nausea And Vomiting  . Latex Itching and Rash       Follow-up Information   Follow up with Carlus Pavlov, MD On 02/01/2013. (Endocrinologist @ 2:15p)    Specialty:  Internal Medicine   Contact information:   [address] [place] [address] [phone]       Follow up with Cassell Clement, MD. (Office will call you with appointment)    Specialty:  Cardiology   Contact information:   [address] [place] [address] [phone]        The results of significant diagnostics from this hospitalization (including imaging, microbiology, ancillary and laboratory) are listed below for reference.    Significant  Diagnostic Studies: Dg Chest 2 View  01/12/2013   CLINICAL DATA:  Tachycardia. Chest palpitations.  EXAM: CHEST  2 VIEW  COMPARISON:  None.  FINDINGS: There is cardiomegaly without edema. Lungs are clear. No pneumothorax or pleural fluid.  IMPRESSION: Cardiomegaly without acute disease.   Electronically Signed   By: Drusilla Kanner M.D.   On: 01/12/2013 03:29   Ct Angio Chest Pe W/cm &/or Wo Cm  01/12/2013   *RADIOLOGY REPORT*  Clinical Data: Tachycardia.  Positive D-dimer. Cocaine abuse. Hypertension.  CT ANGIOGRAPHY CHEST  Technique:  Multidetector CT imaging of the chest using the standard protocol during bolus administration of intravenous contrast. Multiplanar reconstructed images including MIPs were obtained and reviewed to evaluate the vascular anatomy.  Contrast: 80mL OMNIPAQUE IOHEXOL 350 MG/ML SOLN  Comparison: Chest x-ray 01/12/2013.  Findings: Marked cardiac enlargement, suspect cardiomyopathy.  No pulmonary embolic disease.  No pleural or pericardial effusion. No hilar or mediastinal adenopathy.  No pulmonary edema. Normal appearing thoracic and abdominal aorta.  Plate-like atelectasis at the left base.  No pulmonary nodule.  No osseous lesions.  Slight dextroconvex mid thoracic scoliosis.  No rib abnormality. Extrathoracic soft tissues unremarkable. Unremarkable appearing upper abdominal organs.  Trachea midline. Marked diffuse thyroid enlargement, likely goiter.  IMPRESSION: Marked cardiac enlargement, suspect cardiomyopathy.  No pulmonary embolic disease.  No active pulmonary infiltrates or failure.  Marked diffuse thyroid enlargement.   Original Report Authenticated By: Davonna Belling, M.D.   US Abdomen Complete  01/12/2013   *RADIOLOGY REPORT*  Clinical Data:  History of nausea and epigastric pain.  History of hypertension.  ABDOMINAL ULTRASOUND COMPLETE  Comparison:  None  Findings:  Gallbladder: No shadowing gallstones or echogenic sludge. No gallbladder wall thickening or pericholecystic  fluid. The gallbladder wall thickness measured 2.7 mm. No sonographic Murphy's sign according to the ultrasound technologist.  CBD: Normal in caliber measuring 1.9 mm. No choledocholithiasis is evident.  Liver:  Normal size and echotexture without focal parenchymal abnormality. Portal vein is patent with hepatopetal flow.  IVC:  Patent throughout its visualized course in the abdomen.  Pancreas:  Although the pancreas is difficult to visualize in its entirety, no focal pancreatic abnormality is identified.  Spleen:  Normal size and echotexture without focal abnormality. Splenic length is 9 cm.  Right kidney:  No hydronephrosis.  Well-preserved cortex.  Normal parenchymal echotexture. There is a hypoechoic area involving the lower pole of the right kidney.  There is posterior acoustic enhancement consistent with a cystic lesion.  There are some low intensity internal echoes which may reflect artifact or debris. This area measures 2 x 1.7 x 1.1 cm. There is no internal color Doppler flow.  No cyst wall thickening or papillary or solid nodular area is seen.  Right renal length is 10.6 cm.  Left kidney:  No hydronephrosis.  Well-preserved cortex.  Normal parenchymal echotexture without focal abnormalities.  Left renal length is 11.7 cm.  Aorta:  Maximum diameter is 2.0 cm.  No aneurysm is evident.  Ascites:  None.  Incidental note is made of a tiny amount of right pleural effusion.  IMPRESSION: Normal appearance of liver, gallbladder, and bile ducts.  Small cystic area involving the lower pole of the right kidney most likely reflects a small cyst with internal echoes reflecting small amounts of debris or artifact.No solid papillary or nodular area was seen.  There is a tiny amount of right pleural effusion.  No other abnormalities are identified.   Original Report Authenticated By: Onalee Hua Call    Microbiology: No results found for this or any previous visit (from the past 240 hour(s)).   Labs: Basic Metabolic  Panel:  Recent Labs Lab 01/12/13 0246 01/12/13 0655 01/13/13 0440 01/14/13 0440  NA 133* 135 138  --   K 5.0 4.0 4.0  --   CL 100 101 105  --   CO2 24 24 25   --   GLUCOSE 90 147* 88  --   BUN 15 13 12   --   CREATININE 0.34* 0.39* 0.37*  --   CALCIUM 9.3 8.9 9.4  --   MG  --  1.6 1.7 1.7  PHOS  --  4.2 4.2 4.1   Liver Function Tests:  Recent Labs Lab 01/12/13 0655 01/13/13 0440  AST 90* 72*  ALT 40* 33  ALKPHOS 179* 165*  BILITOT 4.7* 3.7*  PROT 7.8 7.3  ALBUMIN 2.8* 2.6*    Recent Labs Lab 01/12/13 0655  LIPASE 50   No results found for this basename: AMMONIA,  in the last 168 hours CBC:  Recent Labs Lab 01/12/13 0246 01/13/13 0440  WBC 5.4 4.9  HGB 10.4* 10.0*  HCT 29.8* 27.8*  MCV 68.7* 67.6*  PLT 152 125*   Cardiac Enzymes:  Recent Labs Lab 01/12/13 0303 01/12/13 0655  TROPONINI <0.30 <0.30   BNP: BNP (last 3 results)  Recent Labs  01/12/13 0304  PROBNP 4083.0*   CBG: No results found for this basename: GLUCAP,  in the last 168 hours     Signed:  Chaya Jan  [facility] Pager: [phone] 01/14/2013, 10:34 AM

## 2013-02-01 ENCOUNTER — Ambulatory Visit (INDEPENDENT_AMBULATORY_CARE_PROVIDER_SITE_OTHER): Payer: Medicaid Other | Admitting: Internal Medicine

## 2013-02-01 ENCOUNTER — Encounter: Payer: Self-pay | Admitting: Internal Medicine

## 2013-02-01 VITALS — BP 110/70 | HR 102 | Temp 98.6°F | Resp 10 | Ht 65.0 in | Wt 102.0 lb

## 2013-02-01 DIAGNOSIS — E0590 Thyrotoxicosis, unspecified without thyrotoxic crisis or storm: Secondary | ICD-10-CM

## 2013-02-01 NOTE — Progress Notes (Signed)
Patient ID: Mary Frey, female   DOB: 12/30/1975, 37 y.o.   MRN: [id number]   HPI  Mary Frey is a 37 y.o.-year-old female, referred by the [facility], for evaluation and treatment for thyrotoxycosis. PCP Dr. Willette Cluster - [facility].  Patient was admitted 01/12/2013 for cocaine detox and was found to be markedly thyrotoxic, with sinus tachycardia, HR in th 130s. She had acute CHF (LVEF 30-35%). She was started on beta blockers and on methimazole 20 mg tid and was advised to follow with me upon discharge. She has a history of cocaine abuse, so the decision to start beta blockers was a difficult one, but he was orally explained to the patient it is mandatory that she does not use cocaine while she is on the medication. She was also advised not to get pregnant while on methimazole.  I reviewed pt's thyroid tests: Lab Results  Component Value Date   TSH <0.008* 01/12/2013   FREET4 8.45* 01/12/2013  TSI elevated, at 221 (<140%).  Pt denies feeling nodules in neck, hoarseness, dysphagia/odynophagia, SOB with lying down; she c/o: - + excessive sweating/heat intolerance - + tremors - + anxiety - + not sleeping well - 5-6 hrs/night - + palpitations - + fatigue - + hyperdefecation - + weight loss  Pt does not have a FH of thyroid ds. No FH of thyroid cancer. No h/o radiation tx to head or neck.  No seaweed or kelp, + recent contrast studies - a CT scan done 09/23 >> R/o PE. No steroid use. No herbal supplements.   ROS: Constitutional: see HPI Eyes: no blurry vision, no xerophthalmia - eyes bulging >> now much improved ENT: no sore throat, no nodules palpated in throat, no dysphagia/odynophagia, no hoarseness Cardiovascular: no CP/+SOB/+palpitations/leg swelling Respiratory: no cough/+SOB Gastrointestinal: no N/V/+D/no C Musculoskeletal: no muscle/joint aches Skin: no rashes Neurological: + tremors/no numbness/tingling/dizziness Psychiatric: no  depression/+anxiety  Past Medical History  Diagnosis Date  . Chronic back pain   . Hypertension   . Pregnancy induced hypertension   . Anemia    Past Surgical History  Procedure Laterality Date  . No past surgeries     History   Social History  . Marital Status: Single    Spouse Name: N/A    Number of Children: N/A  . Years of Education: N/A   Occupational History  . Not on file.   Social History Main Topics  . Smoking status: Former Smoker -- 14 years    Types: Cigarettes  . Smokeless tobacco: Not on file  . Alcohol Use: Yes     Comment: on occasion  . Drug Use: Yes    Special: Cocaine  . Sexual Activity: No   Other Topics Concern  . Not on file   Social History Narrative   Regular exercise: walk   Caffeine use: 2 times a week   Current Outpatient Prescriptions on File Prior to Visit  Medication Sig Dispense Refill  . folic acid (FOLVITE) 1 MG tablet Take 1 tablet (1 mg total) by mouth daily.      . methimazole (TAPAZOLE) 10 MG tablet Take 2 tablets (20 mg total) by mouth 3 (three) times daily.  90 tablet  3  . Multiple Vitamin (MULTIVITAMIN WITH MINERALS) TABS tablet Take 1 tablet by mouth daily.      . propranolol (INDERAL) 20 MG tablet Take 1 tablet (20 mg total) by mouth 4 (four) times daily.  120 tablet  3  . thiamine 100  MG tablet Take 1 tablet (100 mg total) by mouth daily.       No current facility-administered medications on file prior to visit.   Allergies  Allergen Reactions  . Hydrocodone Nausea And Vomiting  . Latex Itching and Rash   Family History  Problem Relation Age of Onset  . Diabetes Maternal Aunt   . Diabetes Maternal Uncle   . Diabetes Maternal Grandmother   . Diabetes Maternal Grandfather   . Stroke Paternal Grandfather    PE: BP 110/70  Pulse 102  Temp(Src) 98.6 F (37 C) (Oral)  Resp 10  Ht 5\' 5"  (1.651 m)  Wt 102 lb (46.267 kg)  BMI 16.97 kg/m2  SpO2 96%  LMP 11/20/2012 Wt Readings from Last 3 Encounters:   02/01/13 102 lb (46.267 kg)  01/14/13 84 lb 14.4 oz (38.51 kg)  02/11/12 115 lb (52.164 kg)   Constitutional: underweight, in NAD Eyes: PERRLA, EOMI, + exophthalmos - bilat., no lid lag, no stare ENT: moist mucous membranes, large thyromegaly - pyramidal lobe visible on exam!, + thyroid bruits R>L, no cervical lymphadenopathy Cardiovascular: tachycardia, RR, No MRG Respiratory: CTA B Gastrointestinal: abdomen soft, NT, ND, BS+ Musculoskeletal: no deformities, strength intact in all 4 Skin: moist, warm, no rashes Neurological: ++ tremor with outstretched hands, hyperreflexic in all 4  ASSESSMENT: 1. 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 Patient with recently found severe thyrotoxicosis and most likely Graves ds based on severity of thyrotoxicosis and the high TSI level. She has all classical sxs: weight loss, heat intolerance, hyperdefecation, palpitations, anxiety, insomnia, exophthalmos. She has started to gain weight on methimazole. Her pulse is still elevated today, while on Inderal. - I suggested that we check the TSH, fT3 and fT4 today   - depending on the tests, we need to decide for further plan. We discussed about possible modalities of treatment for the above conditions, to include methimazole use, radioactive iodine ablation or surgery. My bias for her would be for RAI tx, due to the CHF and the severity of her sxs, which continue despite high dose MMI. One drawback is that her thyroid is enlarged and in these cases, surgery may be the best option.  - I did not increase her Propranolol at this visit, might need to do this depending on our plan further - she did not take the MMI/propranolol today - for now continue same MMi dose - given written alert sxs for MMI (see pt instruction) - I advised her to join my chart to communicate easier - RTC in 2 months, but likely sooner for repeat labs  Preferred phone #: [phone]  Orders Placed This  Encounter  Procedures  . TSH  . T4, free  . T3, free   Office Visit on 02/01/2013  Component Date Value Range Status  . TSH 02/01/2013 0.07* 0.35 - 5.50 uIU/mL Final  . Free T4 02/01/2013 1.63* 0.60 - 1.60 ng/dL Final  . T3, Free 84/13/2440 6.4* 2.3 - 4.2 pg/mL Final   TFTs still abnormal, but much better >> continue MMI 20 mg tid for now. I will d/w pt to get an U/S to see the actual gland volume and any nodules.  I believe this is a surgical case due to her large gland.  Called pt ans she agreed with the thyroid ultrasound.  02/16/2013 THYROID ULTRASOUND  TECHNIQUE:  Ultrasound examination of the thyroid gland and adjacent soft  tissues was performed.  COMPARISON: None.  FINDINGS:  Right thyroid lobe  Measurements: 7.5 x 3.6 x 2.9 cm. Heterogeneous echotexture without  discrete nodule.  Left thyroid lobe  Measurements: 6.5 x 3.2 x 3.1 cm. Heterogeneous echotexture without  discrete nodules.  Isthmus  Thickness: 1.0 cm. Heterogeneous echotexture.  Lymphadenopathy  Nonvisualized; small anterior chain lymph nodes are imaged.   IMPRESSION:  1. Enlarged and heterogeneous gland.  2. No discrete nodules identified.  3. No evidence for lymphadenopathy.   Electronically Signed  By: Rosalie Gums M.D.  On: 02/15/2013 14:17  Tried to call and get in touch with pt multiple times >> unsuccessful. Sent letter. Will refer to surgery since gland is very large.

## 2013-02-01 NOTE — Patient Instructions (Signed)
You have Graves Disease. We will check labs today and then decide for a further treatment plan. Please join MyChart so we can communicate easier. Stay on the same dose of Methimazole for now, but we might need to change the dose after tests are back.  Please stop the Methimazole (Tapazole) and call us or your primary care doctor if you develop: - sore throat - fever - yellow skin - dark urine - light colored stools As we will then need to check your blood counts and liver tests.  Hyperthyroidism The thyroid is a large gland located in the lower front part of your neck. The thyroid helps control metabolism. Metabolism is how your body uses food. It controls metabolism with the hormone thyroxine. When the thyroid is overactive, it produces too much hormone. When this happens, these following problems may occur:   Nervousness  Heat intolerance  Weight loss (in spite of increase food intake)  Diarrhea  Change in hair or skin texture  Palpitations (heart skipping or having extra beats)  Tachycardia (rapid heart rate)  Loss of menstruation (amenorrhea)  Shaking of the hands CAUSES  Grave's Disease (the immune system attacks the thyroid gland). This is the most common cause.  Inflammation of the thyroid gland.  Tumor (usually benign) in the thyroid gland or elsewhere.  Excessive use of thyroid medications (both prescription and 'natural').  Excessive ingestion of Iodine. DIAGNOSIS  To prove hyperthyroidism, your caregiver may do blood tests and ultrasound tests. Sometimes the signs are hidden. It may be necessary for your caregiver to watch this illness with blood tests, either before or after diagnosis and treatment. TREATMENT Short-term treatment There are several treatments to control symptoms. Drugs called beta blockers may give some relief. Drugs that decrease hormone production will provide temporary relief in many people. These measures will usually not give permanent  relief. Definitive therapy There are treatments available which can be discussed between you and your caregiver which will permanently treat the problem. These treatments range from surgery (removal of the thyroid), to the use of radioactive iodine (destroys the thyroid by radiation), to the use of antithyroid drugs (interfere with hormone synthesis). The first two treatments are permanent and usually successful. They most often require hormone replacement therapy for life. This is because it is impossible to remove or destroy the exact amount of thyroid required to make a person euthyroid (normal). HOME CARE INSTRUCTIONS  See your caregiver if the problems you are being treated for get worse. Examples of this would be the problems listed above. SEEK MEDICAL CARE IF: Your general condition worsens. MAKE SURE YOU:   Understand these instructions.  Will watch your condition.  Will get help right away if you are not doing well or get worse. Document Released: 04/08/2005 Document Revised: 07/01/2011 Document Reviewed: 08/20/2006 [facility] Patient Information 2014 [place], [place].  Radioiodine (I-131) Therapy for Hyperthyroidism Radioiodine (I-131) therapy for hyperthyroidism is a procedure used to treat an overactive thyroid (hyperthyroidism). The patient swallows I-131, which is a radioactive form of iodine. The I-131 destroys thyroid cells. The thyroid is a gland in the neck. It makes thyroid hormones, which control how cells throughout the body use energy. Hyperthyroidism is usually caused by Grave's disease or by growths within the thyroid (nodules). Symptoms may include:  Nervousness.  Irritability.  Problems with sleep.  Tiredness.  Fast heart rate.  Shaky hands.  Sweating.  Heat sensitivity.  Unintended weight loss.  Brittle hair.  Enlarged thyroid gland.  Menstrual changes.  Frequent  stools. LET YOUR CAREGIVER KNOW ABOUT:   All allergies.  All medications that  you are taking, including over-the-counter and prescription drugs, dietary supplements, vitamins, or herbal preparations.  Any previous complications from this or other procedures.  Smoking history.  Possibility of pregnancy.  History of bleeding problems.  Any other health problems. RISKS AND COMPLICATIONS  Risks of the procedure include:  Slight pain in the area of the thyroid gland. BEFORE THE PROCEDURE  If you are a woman, you may be asked to have pregnancy testing before the procedure.  If you have been taking thyroid medicines, you will usually be asked to stop them three days before your procedure.  You will usually be asked to stop eating and drinking at midnight the day of your procedure.  You will need to plan for someone to drive you home after the procedure. PROCEDURE  You will be asked to swallow the radioactive iodine in either pill or liquid form. It can take 1-3 months for the treatment to work. The treatment is most effective at about 3-6 months after the dose of iodine has been given. In most people, a single dose of radioactive iodine resolves their hyperthyroidism, but a few people will require a second dose.  AFTER THE PROCEDURE  Because you will be giving off tiny amounts of radiation for several days, there are some special precautions you will be asked to follow for about 2-4 days after the procedure:  Avoid being around babies or pregnant women.  Do not use public bathrooms.  Flush twice after using the toilet.  Take a bath or shower every day.  Practice good hand washing.  Drink fluids normally.  Use disposable utensils, or clean your utensils separately from those of others.  Sleep alone.  Do not engage in intimate contact.  Wash your sheets, towels, and clothes each day, by themselves.  Do not make food for other people. Other precautions include:  Stopping breastfeeding.  Do not attempt to become pregnant for at least a year after you  have had the procedure.  When traveling, bring a letter of explanation from your caregiver for three months. Radioactivity may trip detectors in airports or other places. Because the point of the procedure is to destroy your thyroid gland, you will need to take thyroid hormone by mouth for the rest of your life. HOME CARE INSTRUCTIONS   Ask your caregiver when you should resume or start thyroid medications.  Take all medications exactly as directed.  Follow any prescribed diet.  Follow instructions regarding both rest and physical activity. SEEK IMMEDIATE MEDICAL CARE IF:  You have a dry mouth.  You have a sore throat.  You have neck pain.  You have a tight sensation in the throat.  You have nausea and vomiting.  You are fatigued.  You have flushing.  You have bowel changes (either diarrhea or constipation). Document Released: 08/25/2008 Document Revised: 07/01/2011 Document Reviewed: 08/25/2008 [facility] Patient Information 2014 [place], [place].

## 2013-02-02 LAB — TSH: TSH: 0.07 u[IU]/mL — ABNORMAL LOW (ref 0.35–5.50)

## 2013-02-02 LAB — T4, FREE: Free T4: 1.63 ng/dL — ABNORMAL HIGH (ref 0.60–1.60)

## 2013-02-02 LAB — T3, FREE: T3, Free: 6.4 pg/mL — ABNORMAL HIGH (ref 2.3–4.2)

## 2013-02-04 ENCOUNTER — Encounter: Payer: Self-pay | Admitting: Physician Assistant

## 2013-02-04 ENCOUNTER — Ambulatory Visit (INDEPENDENT_AMBULATORY_CARE_PROVIDER_SITE_OTHER): Payer: Medicaid Other | Admitting: Physician Assistant

## 2013-02-04 VITALS — BP 120/60 | HR 96 | Ht 65.0 in | Wt 103.8 lb

## 2013-02-04 DIAGNOSIS — I428 Other cardiomyopathies: Secondary | ICD-10-CM

## 2013-02-04 DIAGNOSIS — E0590 Thyrotoxicosis, unspecified without thyrotoxic crisis or storm: Secondary | ICD-10-CM

## 2013-02-04 NOTE — Progress Notes (Signed)
[address] [place], [place]  [address] Phone: [phone] Fax:  [phone]  Date:  02/04/2013   ID:  Mary Frey, DOB 10-18-1975, MRN [id number]  PCP:  Provider Not In System  Cardiologist:  Dr. Cassell Clement    History of Present Illness: Mary Frey is a 37 y.o. female who returns for follow up after a recent admission to the hospital.   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 She was noted to be tachycardic and malnourished with weight loss. Lab findings were suggestive of hyperthyroidism.  Echo (01/12/13): Mild LVH, EF 25-30%, inferior AK, mild AI moderate posteriorly directed MR, RVSP 42, moderate TR.  She was seen by cardiology. She was placed on betablocker therapy.  She has seen endocrinology in followup. Her free T4 and free T3 levels are reduced. She is feeling better. She was noting significant chest discomfort with increased heart rates prior to admission to the hospital. This is improved. She denies dyspnea, orthopnea, PND or syncope.  Labs (9/14):   K 4, creatinine 0.37, ALT 33, pro BNP 4083, Hgb 10, TSH 0.07, T4 8.45, T3 > 20 Labs (10/14): TSH 0.07, T4 1.63, T3 6.4  Wt Readings from Last 3 Encounters:  02/04/13 103 lb 12.8 oz (47.083 kg)  02/01/13 102 lb (46.267 kg)  01/14/13 84 lb 14.4 oz (38.51 kg)     Past Medical History  Diagnosis Date  . Chronic back pain   . Hypertension   . Pregnancy induced hypertension   . Anemia     Current Outpatient Prescriptions  Medication Sig Dispense Refill  . folic acid (FOLVITE) 1 MG tablet Take 1 tablet (1 mg total) by mouth daily.      . methimazole (TAPAZOLE) 10 MG tablet Take 2 tablets (20 mg total) by mouth 3 (three) times daily.  90 tablet  3  . Multiple Vitamin (MULTIVITAMIN WITH MINERALS) TABS tablet Take 1 tablet by mouth daily.      . propranolol (INDERAL) 20 MG tablet Take 1 tablet (20 mg total) by mouth 4 (four) times daily.  120 tablet  3  . thiamine 100 MG  tablet Take 1 tablet (100 mg total) by mouth daily.       No current facility-administered medications for this visit.    Allergies:    Allergies  Allergen Reactions  . Hydrocodone Nausea And Vomiting  . Latex Itching and Rash    Social History:  The patient  reports that she has quit smoking. Her smoking use included Cigarettes. She smoked 0.00 packs per day for 14 years. She does not have any smokeless tobacco history on file. She reports that she drinks alcohol. She reports that she uses illicit drugs (Cocaine).   Family History:  The patient's family history includes Diabetes in her maternal aunt, maternal grandfather, maternal grandmother, and maternal uncle; Stroke in her paternal grandfather.   ROS:  Please see the history of present illness.      All other systems reviewed and negative.   PHYSICAL EXAM: VS:  BP 120/60  Pulse 96  Ht 5\' 5"  (1.651 m)  Wt 103 lb 12.8 oz (47.083 kg)  BMI 17.27 kg/m2  LMP 11/20/2012 Well nourished, well developed, in no acute distress HEENT: normal Neck: no JVD Endocrine: Positive thyromegaly Cardiac:  normal S1, S2; RRR; no murmur Lungs:  clear to auscultation bilaterally, no wheezing, rhonchi or rales Abd: soft, nontender, no hepatomegaly Ext: no edema Skin: warm and dry Neuro:  CNs 2-12 intact, no focal abnormalities noted  EKG:  NSR, HR 99, LAD, no acute changes     ASSESSMENT AND PLAN:  1. 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 Continue beta blocker therapy and treatment of her hyperthyroidism.  She will need a followup echocardiogram in 3 months. 2. Hyperthyroidism: Continue followup with endocrinology. 3. Disposition: Followup with Dr. Patty Sermons in 3 months after her followup echocardiogram.  Signed, Tereso Newcomer, PA-C  02/04/2013 11:25 AM

## 2013-02-04 NOTE — Patient Instructions (Signed)
PLEASE SCHEDULE TO HAVE ECHO TO BE DONE THE LAST WEEK OF DEC 2014  YOU WILL NEED TO FOLLOW UP WITH DR. Patty Sermons 1 WEEK AFTER YOUR ECHO  Your physician recommends that you continue on your current medications as directed. Please refer to the Current Medication list given to you today.

## 2013-02-09 ENCOUNTER — Telehealth: Payer: Self-pay | Admitting: Internal Medicine

## 2013-02-15 ENCOUNTER — Telehealth: Payer: Self-pay | Admitting: *Deleted

## 2013-02-15 ENCOUNTER — Ambulatory Visit
Admission: RE | Admit: 2013-02-15 | Discharge: 2013-02-15 | Disposition: A | Discharge status: Home / Self Care | Payer: Medicaid Other | Source: Ambulatory Visit | Attending: Internal Medicine | Admitting: Internal Medicine

## 2013-02-15 NOTE — Telephone Encounter (Signed)
Pt called and requested results from her U/S that she had today. Please advise.

## 2013-02-16 NOTE — Telephone Encounter (Signed)
Pt called for U/S results.

## 2013-02-16 NOTE — Telephone Encounter (Signed)
Tried again to get in touch with the patient, was only able to leave a message. Will retry tomorrow.

## 2013-02-16 NOTE — Telephone Encounter (Signed)
I called the patient was only able to leave a message. I explained that the thyroid gland is enlarged, without any nodules. I will try to call her again later.

## 2013-02-17 ENCOUNTER — Telehealth: Payer: Self-pay | Admitting: *Deleted

## 2013-02-17 ENCOUNTER — Encounter: Payer: Self-pay | Admitting: Internal Medicine

## 2013-02-17 NOTE — Telephone Encounter (Signed)
Called pt back and discussed plan >> would like to defer sx for now as she tells me she was not very compliant with her MMI tx (taking maybe 4 tabs a day instead of the prescribed 6 tabs). She would like to try to take it as advised and see if this helps correct her hyperthyroidism. I agree to try this >> pt has another appt with me in 1.5 mo >> recheck TFTs then and see from there. I do not see the need for an Uptake and Scan for now.

## 2013-02-17 NOTE — Telephone Encounter (Signed)
Tried again to get in touch with the patient, was only able to talk to her sister who put me on hold for a long time so I had to hang up. I will await her call and will also send her a letter with the results and further plan.

## 2013-02-17 NOTE — Telephone Encounter (Signed)
Pt returned call and I read her the letter from Dr Elvera Lennox. Pt states she does not want to have surgery and would like to discuss this with Dr Elvera Lennox. Please be advised. [phone]).

## 2013-02-23 ENCOUNTER — Other Ambulatory Visit: Payer: Self-pay | Admitting: *Deleted

## 2013-02-23 MED ORDER — FOLIC ACID 1 MG PO TABS: 1.0000 mg | ORAL_TABLET | Freq: Every day | ORAL | Status: DC

## 2013-02-26 ENCOUNTER — Ambulatory Visit (INDEPENDENT_AMBULATORY_CARE_PROVIDER_SITE_OTHER): Payer: Medicaid Other | Admitting: Surgery

## 2013-03-16 ENCOUNTER — Telehealth: Payer: Self-pay | Admitting: Internal Medicine

## 2013-03-17 NOTE — Telephone Encounter (Signed)
I think she is referring to the appt with Dr Gerrit Friends - please advise her to call them and reschedule.

## 2013-03-17 NOTE — Telephone Encounter (Signed)
Pt called and asked to be called back concerning rescheduling a referred appt. Please advise me what the pt is talking about. Thank you.

## 2013-04-05 ENCOUNTER — Encounter: Payer: Self-pay | Admitting: Internal Medicine

## 2013-04-05 ENCOUNTER — Ambulatory Visit (INDEPENDENT_AMBULATORY_CARE_PROVIDER_SITE_OTHER): Payer: Medicaid Other | Admitting: Internal Medicine

## 2013-04-05 ENCOUNTER — Ambulatory Visit: Payer: Medicaid Other | Admitting: Internal Medicine

## 2013-04-05 VITALS — BP 142/90 | HR 80 | Temp 98.3°F | Resp 12 | Ht 65.0 in | Wt 125.8 lb

## 2013-04-05 DIAGNOSIS — E0590 Thyrotoxicosis, unspecified without thyrotoxic crisis or storm: Secondary | ICD-10-CM

## 2013-04-05 LAB — T4, FREE: Free T4: 0.35 ng/dL — ABNORMAL LOW (ref 0.60–1.60)

## 2013-04-05 LAB — TSH: TSH: 0.04 u[IU]/mL — ABNORMAL LOW (ref 0.35–5.50)

## 2013-04-05 MED ORDER — METHIMAZOLE 10 MG PO TABS: 10.0000 mg | ORAL_TABLET | Freq: Two times a day (BID) | ORAL | Status: DC

## 2013-04-05 NOTE — Patient Instructions (Signed)
Decrease the Methimazole to 10 mg 2x a day. Decrease the Inderal to 20 mg 2x a day. Please return in 5 weeks for labs. Please stop at the lab.

## 2013-04-05 NOTE — Progress Notes (Signed)
Patient ID: Mary Frey, female   DOB: 01-Jun-1975, 37 y.o.   MRN: [id number]   HPI  Mary Frey is a 37 y.o.-year-old female, returning for f/u for thyrotoxycosis. PCP Dr. Willette Cluster - [facility].  Reviewed history: Patient was admitted 01/12/2013 for cocaine detox and was found to be markedly thyrotoxic, with sinus tachycardia, HR in th 130s. She had acute CHF (LVEF 30-35%). She was started on beta blockers and on methimazole 20 mg tid and was advised to follow with me upon discharge. She has a history of cocaine abuse, so the decision to start beta blockers was a difficult one, but it was explained to the patient it was mandatory that she should not use cocaine while she is on the medication. She was also advised not to get pregnant while on methimazole.  I reviewed pt's thyroid tests: Lab Results  Component Value Date   TSH 0.07* 02/01/2013   TSH <0.008* 01/12/2013   FREET4 1.63* 02/01/2013   FREET4 8.45* 01/12/2013  TSI elevated, at 221 (<140%).  Pt denies feeling nodules in neck, hoarseness, dysphagia/odynophagia, SOB with lying down, but she feels her thyroid enlarged when she palpates her neck.  Her appearance and condition improved greatly since I last saw her: - resolved excessive sweating/heat intolerance - no tremors - no anxiety - still not sleeping well - 5-6 hrs/night - no palpitations - no fatigue - no hyperdefecation - + weight gain: 22 lbs  A Thyroid U/S was checked on 02/15/2013: no nodules, very large gland.  ROS: Constitutional: see HPI Eyes: no blurry vision, no xerophthalmia - eyes bulging >> now much improved ENT: no sore throat, no nodules palpated in throat, no dysphagia/odynophagia, no hoarseness Cardiovascular: no CP/SOB/palpitations/leg swelling Respiratory: no cough/SOB Gastrointestinal: no N/V/D/no C Musculoskeletal: no muscle/joint aches Skin: no rashes, + easy bruising Neurological: no tremors/no  numbness/tingling/dizziness  I reviewed pt's medications, allergies, PMH, social hx, family hx and no changes required, except as mentioned above.  PE: BP 142/90  Pulse 80  Temp(Src) 98.3 F (36.8 C)  Resp 12  Ht 5\' 5"  (1.651 m)  Wt 125 lb 12.8 oz (57.063 kg)  BMI 20.93 kg/m2  SpO2 98%  LMP 03/11/2013 Wt Readings from Last 3 Encounters:  04/05/13 125 lb 12.8 oz (57.063 kg)  02/04/13 103 lb 12.8 oz (47.083 kg)  02/01/13 102 lb (46.267 kg)   Constitutional: normal weight, in NAD Eyes: PERRLA, EOMI, mild exophthalmos - bilat., no lid lag, no stare ENT: moist mucous membranes, large thyromegaly, no cervical lymphadenopathy Cardiovascular: RRR, No MRG Respiratory: CTA B Gastrointestinal: abdomen soft, NT, ND, BS+ Musculoskeletal: no deformities, strength intact in all 4 Skin: moist, warm, no rashes Neurological: very fine tremor with outstretched hands, DTR +2/5 in all 4  ASSESSMENT: 1. 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 Heterogeneous echotexture without discrete nodule.   Left thyroid lobe: 6.5 x 3.2 x 3.1 cm. Heterogeneous echotexture without discrete nodules.   Isthmus: 1.0 cm. Heterogeneous echotexture.   Lymphadenopathy - Nonvisualized; small anterior chain lymph nodes are imaged.    PLAN:  1. Patient with severe Graves ds based on severity of thyrotoxicosis and the high TSI level. She had all classical sxs: weight loss, heat intolerance, hyperdefecation, palpitations, anxiety, insomnia, exophthalmos. Due to the size of her thyroid gland, I suggested that she has surgery as a therapy for Graves' disease, but she refused. In that case, we  decided to continue methimazole 20 mg 3 times a day. On this dose, she finally started to gain weight and she feels very well. - we will check the TSH, fT3 and fT4 today   - depending on the tests, we need to change the dose of MMI. Since her thyroid  tests R. most likely improved, I advised her to decrease her methimazole dose to 10 mg twice a day. - I also advised her to decrease Propranolol from 4 tablets to 2 tablets daily - I advised her to join my chart to communicate easier - RTC in 3 months, but in 5-6 weeks for repeat labs  Office Visit on 04/05/2013  Component Date Value Range Status  . TSH 04/05/2013 0.04* 0.35 - 5.50 uIU/mL Final  . Free T4 04/05/2013 0.35* 0.60 - 1.60 ng/dL Final  . T3, Free 95/62/1308 2.6  2.3 - 4.2 pg/mL Final   TSH still low, as expected, as it lags behind the free T4 and free T3. Since free T4 low, will decrease the methimazole as advised, 10 mg twice a day and check labs in a month.  Preferred phone #: [phone] - ok to leave message

## 2013-04-12 ENCOUNTER — Telehealth: Payer: Self-pay | Admitting: *Deleted

## 2013-04-12 NOTE — Telephone Encounter (Signed)
Pt called and lvm requesting lab results. Called pt back at the number she left on vm [phone] and left another vm advising her that her TSH was low and Dr Elvera Lennox advised her to decrease her methimazole. Dr Elvera Lennox has sent an rx to your pharmacy. Return in 4-5 weeks for labs.

## 2013-04-19 ENCOUNTER — Other Ambulatory Visit (HOSPITAL_COMMUNITY): Payer: Medicaid Other

## 2013-04-20 ENCOUNTER — Ambulatory Visit: Payer: Self-pay | Admitting: Advanced Practice Midwife

## 2013-06-22 ENCOUNTER — Ambulatory Visit: Payer: Medicaid Other | Admitting: Advanced Practice Midwife

## 2013-06-23 ENCOUNTER — Telehealth: Payer: Self-pay | Admitting: Internal Medicine

## 2013-06-23 ENCOUNTER — Encounter: Payer: Self-pay | Admitting: *Deleted

## 2013-06-23 NOTE — Telephone Encounter (Signed)
Returned pt's call. Pt needed dates of her appts, date of U/S, diagnosis and medication she is on for her disorder faxed to her advisor at [facility], [place] at [phone]. Letter faxed to [phone].

## 2013-06-23 NOTE — Telephone Encounter (Signed)
Pt needs office visits for school She wanted to speak directly with nurse stat  Call back:[phone]  Thank You :)

## 2013-06-25 ENCOUNTER — Telehealth: Payer: Self-pay | Admitting: Internal Medicine

## 2013-06-25 NOTE — Telephone Encounter (Signed)
Called pt and lvm advising Mary Frey that she would need to get in touch with someone in medical records or billing that would prove the dates she was in the hospital. We do not have records of this and we cannot send a letter stating that she was in there. That is something she would have to contact the hospital to get.

## 2013-06-25 NOTE — Telephone Encounter (Signed)
Pt would like for you to fax a letter to the same fax number at [facility] stating that she was in the hospital for 7 days in September, November 4 days, December for 2 wks.

## 2013-07-15 ENCOUNTER — Ambulatory Visit: Payer: Medicaid Other | Admitting: Internal Medicine

## 2013-08-03 ENCOUNTER — Ambulatory Visit: Payer: Medicaid Other | Admitting: Internal Medicine

## 2013-08-10 ENCOUNTER — Ambulatory Visit: Payer: Medicaid Other | Admitting: Internal Medicine

## 2013-08-12 ENCOUNTER — Encounter: Payer: Self-pay | Admitting: Internal Medicine

## 2013-08-12 ENCOUNTER — Ambulatory Visit (INDEPENDENT_AMBULATORY_CARE_PROVIDER_SITE_OTHER): Payer: Medicaid Other | Admitting: Internal Medicine

## 2013-08-12 VITALS — BP 122/80 | HR 106 | Temp 97.6°F | Resp 12 | Wt 122.6 lb

## 2013-08-12 DIAGNOSIS — E0590 Thyrotoxicosis, unspecified without thyrotoxic crisis or storm: Secondary | ICD-10-CM

## 2013-08-12 DIAGNOSIS — E0500 Thyrotoxicosis with diffuse goiter without thyrotoxic crisis or storm: Secondary | ICD-10-CM

## 2013-08-12 LAB — T3, FREE: T3, Free: 3.3 pg/mL (ref 2.3–4.2)

## 2013-08-12 LAB — T4, FREE: Free T4: 0.2 ng/dL — ABNORMAL LOW (ref 0.60–1.60)

## 2013-08-12 LAB — TSH: TSH: 4.22 u[IU]/mL (ref 0.35–5.50)

## 2013-08-12 MED ORDER — PROPRANOLOL HCL 20 MG PO TABS: 20.0000 mg | ORAL_TABLET | Freq: Two times a day (BID) | ORAL | Status: DC

## 2013-08-12 NOTE — Patient Instructions (Signed)
Continue Methimazole 10 mg 2x a day. Continue Inderal 20 mg 2x a day. Please stop at the lab. We will call you with the results. Please return for labs in 5 weeks and for a visit with me in 3 months.

## 2013-08-12 NOTE — Progress Notes (Signed)
Patient ID: Mary Frey, female   DOB: 09-Dec-1975, 38 y.o.   MRN: [id number]   HPI  Mary Frey is a 38 y.o.-year-old female, returning for f/u for thyrotoxycosis. PCP Dr. Laretta Alstrom - [facility]. Last visit 4 mo ago. She did not return for repeat labs in 1 mo, as advised. She missed several appts and today she is late 25 min for her 15 min appt.  Reviewed history: Patient was admitted 01/12/2013 for cocaine detox and was found to be markedly thyrotoxic, with sinus tachycardia, HR in th 130s. She had acute CHF (LVEF 30-35%). She was started on beta blockers and on methimazole 20 mg tid and was advised to follow with me upon discharge. She has a history of cocaine abuse, so the decision to start beta blockers was a difficult one, but it was explained to the patient it was mandatory that she should not use cocaine while she is on the medication. She was also advised not to get pregnant while on methimazole.  I reviewed pt's thyroid tests: Lab Results  Component Value Date   TSH 0.04* 04/05/2013   TSH 0.07* 02/01/2013   TSH <0.008* 01/12/2013   FREET4 0.35* 04/05/2013   FREET4 1.63* 02/01/2013   FREET4 8.45* 01/12/2013  TSI elevated, at 221 (<140%).  We decreased the MMI to 10 mg bid at last visit >> at b'fast and lunch. She did not take the [place] for the last 2 days.   Pt denies feeling nodules in neck, hoarseness, dysphagia/odynophagia, SOB with lying down, but she feels her thyroid enlarged when she palpates her neck.  She feels well. - resolved excessive sweating/heat intolerance - feels still a little "jittery", but not new - no tremors - + anxiety - still not sleeping well - 5-6 hrs/night - no palpitations - no fatigue - no hyperdefecation - + stable weight, before weight gain: 22 lbs  A Thyroid U/S was checked on 02/15/2013: no nodules, very large gland.  ROS: Constitutional: see HPI Eyes: no blurry vision, no xerophthalmia - eyes bulging >> now much  improved ENT: no sore throat, no nodules palpated in throat, no dysphagia/odynophagia, no hoarseness Cardiovascular: no CP/SOB/palpitations/leg swelling Respiratory: no cough/SOB Gastrointestinal: no N/V/D/no C Musculoskeletal: no muscle/joint aches Skin: no rashes Neurological: no tremors/no numbness/tingling/dizziness  I reviewed pt's medications, allergies, PMH, social hx, family hx and no changes required, except as mentioned above.  PE: BP 122/80  Pulse 106  Temp(Src) 97.6 F (36.4 C) (Oral)  Resp 12  Wt 122 lb 9.6 oz (55.611 kg)  SpO2 97% Wt Readings from Last 3 Encounters:  08/12/13 122 lb 9.6 oz (55.611 kg)  04/05/13 125 lb 12.8 oz (57.063 kg)  02/04/13 103 lb 12.8 oz (47.083 kg)   Constitutional: normal weight, in NAD Eyes: PERRLA, EOMI, mild exophthalmos - bilat., no lid lag, no stare ENT: moist mucous membranes, large thyromegaly, no cervical lymphadenopathy Cardiovascular: RRR, No MRG Respiratory: CTA B Gastrointestinal: abdomen soft, NT, ND, BS+ Musculoskeletal: no deformities, strength intact in all 4 Skin: moist, warm, no rashes Neurological: no more tremor with outstretched hands, DTR +2/5 in all 4  ASSESSMENT: 1. 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 Heterogeneous echotexture without discrete nodule.   Left thyroid lobe: 6.5 x 3.2 x 3.1 cm. Heterogeneous echotexture without discrete nodules.   Isthmus: 1.0 cm. Heterogeneous echotexture.   Lymphadenopathy - Nonvisualized; small anterior chain lymph nodes are  imaged.    PLAN:  1. Patient with severe Graves ds based on severity of thyrotoxicosis and the high TSI level. She had all classical sxs: weight loss, heat intolerance, hyperdefecation, palpitations, anxiety, insomnia, exophthalmos. Due to the size of her thyroid gland, I suggested that she has surgery as a therapy for Graves' disease, but she refused. In that  case, we decided to continue methimazole, now on 10 mg bid, decreased 4 mo ago from 20 mg 3 times a day. She did not return for labs.  - we will check the TSH, fT3 and fT4 today  - depending on the tests, we need to change the dose of MMI.  - continue Propranolol 2 tablets daily - I again advised her to join my chart to communicate easier - RTC in 3 months, but in 5 weeks for repeat labs  Preferred phone #: [phone] - ok to leave message  Office Visit on 08/12/2013  Component Date Value Ref Range Status  . TSH 08/12/2013 4.22  0.35 - 5.50 uIU/mL Final  . Free T4 08/12/2013 0.20* 0.60 - 1.60 ng/dL Final  . T3, Free 08/12/2013 3.3  2.3 - 4.2 pg/mL Final   Tests much improved, decrease MMI to 5 mg bid >> repeat TFT labs in 4-5 weeks.

## 2013-08-13 MED ORDER — METHIMAZOLE 10 MG PO TABS: 5.0000 mg | ORAL_TABLET | Freq: Two times a day (BID) | ORAL | Status: DC

## 2013-10-26 ENCOUNTER — Other Ambulatory Visit: Payer: Self-pay | Admitting: Internal Medicine

## 2013-10-27 ENCOUNTER — Other Ambulatory Visit: Payer: Self-pay | Admitting: *Deleted

## 2013-10-27 MED ORDER — METHIMAZOLE 10 MG PO TABS: 5.0000 mg | ORAL_TABLET | Freq: Two times a day (BID) | ORAL | Status: DC

## 2013-11-11 ENCOUNTER — Ambulatory Visit: Payer: Medicaid Other | Admitting: Internal Medicine

## 2013-11-16 ENCOUNTER — Other Ambulatory Visit: Payer: Self-pay | Admitting: *Deleted

## 2013-11-16 ENCOUNTER — Telehealth: Payer: Self-pay | Admitting: Internal Medicine

## 2013-11-16 MED ORDER — METHIMAZOLE 10 MG PO TABS: 5.0000 mg | ORAL_TABLET | Freq: Two times a day (BID) | ORAL | Status: DC

## 2013-11-16 NOTE — Telephone Encounter (Signed)
Patient need a refill on meds Methimazole 10 mg she stated meds was more sustainable when she was taking it 6 x a day. Please call her.

## 2013-11-23 ENCOUNTER — Ambulatory Visit: Payer: Medicaid Other | Admitting: Internal Medicine

## 2013-11-25 ENCOUNTER — Ambulatory Visit: Payer: Medicaid Other | Admitting: Internal Medicine

## 2013-12-07 ENCOUNTER — Telehealth: Payer: Self-pay | Admitting: Internal Medicine

## 2013-12-07 MED ORDER — METHIMAZOLE 10 MG PO TABS: 5.0000 mg | ORAL_TABLET | Freq: Two times a day (BID) | ORAL | Status: DC

## 2013-12-07 NOTE — Telephone Encounter (Signed)
methemazole rx needs to be refilled or at least bridged until her next appt

## 2013-12-07 NOTE — Telephone Encounter (Signed)
Rx sent per pt's request.  

## 2013-12-16 ENCOUNTER — Ambulatory Visit (INDEPENDENT_AMBULATORY_CARE_PROVIDER_SITE_OTHER): Payer: Medicaid Other | Admitting: Internal Medicine

## 2013-12-16 ENCOUNTER — Encounter: Payer: Self-pay | Admitting: *Deleted

## 2013-12-16 ENCOUNTER — Encounter: Payer: Self-pay | Admitting: Internal Medicine

## 2013-12-16 VITALS — BP 104/78 | HR 92 | Temp 98.1°F | Resp 12 | Wt 125.8 lb

## 2013-12-16 DIAGNOSIS — E0500 Thyrotoxicosis with diffuse goiter without thyrotoxic crisis or storm: Secondary | ICD-10-CM

## 2013-12-16 LAB — T3, FREE: T3, Free: 3.1 pg/mL (ref 2.3–4.2)

## 2013-12-16 LAB — TSH: TSH: 0.72 u[IU]/mL (ref 0.35–4.50)

## 2013-12-16 LAB — T4, FREE: Free T4: 0.61 ng/dL (ref 0.60–1.60)

## 2013-12-16 NOTE — Progress Notes (Signed)
Patient ID: Mary Frey, female   DOB: Aug 21, 1975, 38 y.o.   MRN: [id number]   HPI  Mary Frey is a 38 y.o.-year-old female, returning for f/u for Graves ds.Marland Kitchen PCP Dr. Laretta Alstrom - [facility]. Last visit 4 mo ago. She did not return for repeat labs in 1 mo, as advised.   Reviewed history: Patient was admitted 01/12/2013 for cocaine detox and was found to be markedly thyrotoxic, with sinus tachycardia, HR in th 130s. She had acute CHF (LVEF 30-35%). She was started on beta blockers and on methimazole 20 mg tid and was advised to follow with me upon discharge. She has a history of cocaine abuse, so the decision to start beta blockers was a difficult one, but it was explained to the patient it was mandatory that she should not use cocaine while she is on the medication. She was also advised not to get pregnant while on methimazole.  I reviewed pt's thyroid tests: Lab Results  Component Value Date   TSH 4.22 08/12/2013   TSH 0.04* 04/05/2013   TSH 0.07* 02/01/2013   TSH <0.008* 01/12/2013   FREET4 0.20* 08/12/2013   FREET4 0.35* 04/05/2013   FREET4 1.63* 02/01/2013   FREET4 8.45* 01/12/2013  TSI elevated, at 221 (<140%).  We decreased the MMI to 10 mg bid and then, at last visit to 5 mg bid >> she is actually taking 3 tabs daily in am. She did not return for labs after we changed the dose  Pt denies feeling nodules in neck, hoarseness, dysphagia/odynophagia, SOB with lying down, but she feels her thyroid enlarged when she palpates her neck.  She  - no excessive sweating/heat intolerance - + insomnia - reappeared - + still a little "jittery", but not new - no tremors - + little anxiety - no palpitations - no fatigue - no hyperdefecation - + stable weight - + eyes swollen  A Thyroid U/S was checked on 02/15/2013: no nodules, very large gland.  ROS: Constitutional: see HPI Eyes: no blurry vision, no xerophthalmia - eyes bulging >> now much improved, but  swollen ENT: no sore throat, no nodules palpated in throat, no dysphagia/odynophagia, no hoarseness Cardiovascular: no CP/SOB/palpitations/leg swelling Respiratory: no cough/SOB Gastrointestinal: no N/V/D/no C Musculoskeletal: no muscle/joint aches Skin: no rashes Neurological: no tremors/no numbness/tingling/dizziness  I reviewed pt's medications, allergies, PMH, social hx, family hx and no changes required, except as mentioned above.  PE: BP 104/78  Pulse 92  Temp(Src) 98.1 F (36.7 C) (Oral)  Resp 12  Wt 125 lb 12.8 oz (57.063 kg)  SpO2 97% Wt Readings from Last 3 Encounters:  12/16/13 125 lb 12.8 oz (57.063 kg)  08/12/13 122 lb 9.6 oz (55.611 kg)  04/05/13 125 lb 12.8 oz (57.063 kg)   Constitutional: normal weight, in NAD Eyes: PERRLA, EOMI, mild exophthalmos - bilat., no lid lag, no stare ENT: moist mucous membranes, large thyromegaly, no cervical lymphadenopathy Cardiovascular: RRR, No MRG Respiratory: CTA B Gastrointestinal: abdomen soft, NT, ND, BS+ Musculoskeletal: no deformities, strength intact in all 4 Skin: moist, warm, no rashes Neurological: no more tremor with outstretched hands, DTR +2/5 in all 4  ASSESSMENT: 1. 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 Heterogeneous echotexture without discrete nodule.   Left thyroid lobe: 6.5 x 3.2 x 3.1 cm. Heterogeneous echotexture without discrete nodules.   Isthmus: 1.0 cm. Heterogeneous echotexture.   Lymphadenopathy - Nonvisualized; small anterior  chain lymph nodes are imaged.    PLAN:  1. Patient with h/o severe Graves ds based on severity of thyrotoxicosis and the high TSI level. She had all classical sxs: weight loss, heat intolerance, hyperdefecation, palpitations, anxiety, insomnia, exophthalmos. Due to the size of her thyroid gland, I again suggested that she has surgery as a therapy for Graves' disease, but she would like  to continue to try MMI. She is not completely compliant with med advice and misses lab and clinic appts. I underlined the need to follow directions for her to start feeling better. Patient Instructions  Please stop at the lab. Continue Methimazole 15 mg daily but split in 10 mg in am and 5 mg in pm until we let you know about the labs. Please come back for a follow-up appointment in 3 months. - given letter for school - we will check the TSH, fT3 and fT4 today  - depending on the tests, we may need to change the dose of MMI.  - continue Propranolol 2 tablets of 20 mgdaily - I again advised her to join my chart to communicate easier - RTC in 3 months  Preferred phone #: [phone] - ok to leave message  Office Visit on 12/16/2013  Component Date Value Ref Range Status  . T3, Free 12/16/2013 3.1  2.3 - 4.2 pg/mL Final  . Free T4 12/16/2013 0.61  0.60 - 1.60 ng/dL Final  . TSH 12/16/2013 0.72  0.35 - 4.50 uIU/mL Final    TFTs normal! Continue MMI dose as advised: 10 mg in am and 5 mg with dinner. Will recheck her tests in 3 mo.

## 2013-12-16 NOTE — Patient Instructions (Addendum)
Please stop at the lab. Continue Methimazole 15 mg daily but split in 10 mg in am and 5 mg in pm until we let you know about the labs. Please come back for a follow-up appointment in 3 months.

## 2013-12-23 ENCOUNTER — Telehealth: Payer: Self-pay | Admitting: Internal Medicine

## 2013-12-23 ENCOUNTER — Telehealth: Payer: Self-pay | Admitting: *Deleted

## 2013-12-23 NOTE — Telephone Encounter (Signed)
There was a change in the methaprozole meds in June for the summer because with dosage was too high.   Pt needs that info added to what is going to her school

## 2013-12-23 NOTE — Telephone Encounter (Signed)
Pt needs methimazole called into [facility] please she is out

## 2013-12-23 NOTE — Telephone Encounter (Signed)
Opened encounter in error  

## 2013-12-23 NOTE — Telephone Encounter (Signed)
Pt called twice today. Pt called the first time stating that the Director at her school, [employer] would not accept the letter that Dr Cruzita Lederer did for the pt. She needed it to be more detailed, directed to her, Edwyna Shell at [employer], she needs the letter to confirm that the pt is and has been suffering with Graves disease for 4 years, she has lost weight due to the disease and the addition that her methimazole had to be changed in June for the summer because the dosage was too high. She gave the director's phone and fax number. Pt called again stating that she wanted to come to the office to discuss with Larene Beach or Dr Cruzita Lederer about what the letter needed to say in detail. Advised the pt that I, Larene Beach, would call Ms Degaine and ask her exactly what she needed. Pt still wants to come to the office. Tried to explain to the pt that is tough during clinical hours. That we are seeing pts. Advised the pt I would share this with Dr Cruzita Lederer and advise her. I called Ms Degaine and left a voice message for her to return my call. Be advised.

## 2013-12-23 NOTE — Telephone Encounter (Signed)
As I already discussed with her, I cannot firmly attest that her thyroid problem started 4 years ago (although this is very possible) since the first TFTs that we have are from 12/2012. We decreased the MMI dose in 07/2013 as her TFTs were improving.  Mary Frey, Please see what the requirements are and let's put together a new letter, although with the amendments above.

## 2013-12-24 ENCOUNTER — Other Ambulatory Visit: Payer: Self-pay | Admitting: *Deleted

## 2013-12-24 ENCOUNTER — Encounter: Payer: Self-pay | Admitting: *Deleted

## 2013-12-24 MED ORDER — METHIMAZOLE 10 MG PO TABS: ORAL_TABLET | ORAL | Status: DC

## 2013-12-24 NOTE — Telephone Encounter (Signed)
Called pt and lvm advising her that Dr Cruzita Lederer and I put a new letter together and faxed it to Ms Degain at [facility] [employer]; fax# [phone]. Called Ms Degain and lvm as well. Advised the pt that she can come by our office to pick up the letter to take to Ms Degain as well. Advised pt to call back and let us know what she wants to do.

## 2013-12-29 ENCOUNTER — Telehealth: Payer: Self-pay | Admitting: Internal Medicine

## 2013-12-29 NOTE — Telephone Encounter (Signed)
Mary Frey from [employer] stated, the letter you sent was not what she needed. She asked you to call her so she can let you know exactly what is needed . Phone # [phone]

## 2013-12-29 NOTE — Telephone Encounter (Signed)
Agree - I decreased her medication as her thyroid tests normalized.

## 2013-12-29 NOTE — Telephone Encounter (Signed)
Returned call to Ms Mary Frey at [employer] She said that pt had dropped class last month and has said that it was due to her medication change. She wasn't able to attend classes due to medication change. Advised Ms Degain that the letter that Dr Cruzita Lederer had me to fax was the correct information. That Dr Cruzita Lederer cannot medically say the pt could not attend classes due to the medication changes. Ms Degain understood. Be advised.

## 2013-12-30 ENCOUNTER — Encounter: Payer: Self-pay | Admitting: Internal Medicine

## 2014-01-12 ENCOUNTER — Ambulatory Visit: Payer: Medicaid Other | Admitting: Obstetrics

## 2014-01-20 ENCOUNTER — Ambulatory Visit: Payer: Medicaid Other | Admitting: Obstetrics

## 2014-01-26 ENCOUNTER — Encounter: Payer: Self-pay | Admitting: Internal Medicine

## 2014-01-26 ENCOUNTER — Ambulatory Visit: Payer: Medicaid Other | Admitting: Obstetrics

## 2014-01-28 ENCOUNTER — Telehealth: Payer: Self-pay | Admitting: Internal Medicine

## 2014-01-28 NOTE — Telephone Encounter (Signed)
Dismissal Letter sent by [employer] [id number]  Received the Return Receipt showing someone picked up the Dismissal 02/03/2014

## 2014-02-03 ENCOUNTER — Telehealth: Payer: Self-pay | Admitting: Internal Medicine

## 2014-02-03 NOTE — Telephone Encounter (Signed)
The pt called to discuss the discharge letter. She proceeded to relay info to me that she was filing a lawsuit towards [employer]. I told her that the reason for the dismissal is on the letter she has and that the dismissal came from the falsified letter that she submitted to her college. That was all that was said to her. I did let her know that the falsified letter that we received from her [facility] did not have medical information on it.

## 2014-02-21 ENCOUNTER — Encounter: Payer: Self-pay | Admitting: Internal Medicine

## 2014-02-21 ENCOUNTER — Telehealth: Payer: Self-pay | Admitting: Internal Medicine

## 2014-02-21 NOTE — Telephone Encounter (Signed)
I a not sure what Differin is. Will only refill her thyroid meds.

## 2014-02-21 NOTE — Telephone Encounter (Signed)
Please read note below and advise.  

## 2014-02-21 NOTE — Telephone Encounter (Signed)
Called pt and lvm advising her per Dr Arman Filter note. Pt called back. She wants a refill of all her meds that Dr Cruzita Lederer prescribes for her. Please advise. Thank you.

## 2014-02-21 NOTE — Telephone Encounter (Signed)
Mary Frey attempted to call the pt to let her know above Dr. Arman Filter decision to defer the refill for differin to a dermatologist however I had to leave a voice message for her to return our call

## 2014-02-21 NOTE — Telephone Encounter (Signed)
OK to call in MMI 10 mg in am and 5 mg in pm  - enough for 2 months. We need to recheck labs in 1 mo (TSH, fT4 and fT3 - can you please order these?) in case she does not find a new endo until then. I will not discuss with her further or reinstate her as a pt.

## 2014-02-21 NOTE — Telephone Encounter (Signed)
Colletta Maryland called pt to let her know that the methimazole and the propanolol will be refilled but nothing more. Colletta Maryland also let her know that Dr. Cruzita Lederer will not further discuss this matter with the patient. She has decided that she will not reinstate the pt. The patient has been informed 3 times now that Dr. Cruzita Lederer will not see her if she come in the office without an appt. The patient is still insisting that she must talk to the MD she does not want to talk to the staff anymore we are not trustworthy. Patient is asking Dr. Cruzita Lederer to please refill her differin due to a recent breakout on her face. Please advise.

## 2014-02-21 NOTE — Telephone Encounter (Signed)
Pt would like to speak with Dr. Cruzita Lederer and see if she can explain herself and get reinstated. Pt needs refill on her meds prior to her 30 day limit so she can have the time to find a new MD.

## 2014-02-21 NOTE — Telephone Encounter (Signed)
Please read note below. Be advised.

## 2014-03-14 ENCOUNTER — Ambulatory Visit: Payer: Medicaid Other | Admitting: Internal Medicine

## 2014-03-21 ENCOUNTER — Ambulatory Visit: Payer: Medicaid Other | Admitting: Obstetrics

## 2014-03-30 ENCOUNTER — Encounter: Payer: Self-pay | Admitting: Obstetrics & Gynecology

## 2014-03-30 ENCOUNTER — Ambulatory Visit (INDEPENDENT_AMBULATORY_CARE_PROVIDER_SITE_OTHER): Payer: Medicaid Other | Admitting: Obstetrics & Gynecology

## 2014-03-30 VITALS — BP 150/99 | HR 79 | Temp 98.4°F | Wt 130.0 lb

## 2014-03-30 DIAGNOSIS — N939 Abnormal uterine and vaginal bleeding, unspecified: Secondary | ICD-10-CM

## 2014-03-30 MED ORDER — NORETHINDRONE 0.35 MG PO TABS: 1.0000 | ORAL_TABLET | Freq: Every day | ORAL | Status: DC

## 2014-03-30 NOTE — Patient Instructions (Signed)
You Can Quit Smoking If you are ready to quit smoking or are thinking about it, congratulations! You have chosen to help yourself be healthier and live longer! There are lots of different ways to quit smoking. Nicotine gum, nicotine patches, a nicotine inhaler, or nicotine nasal spray can help with physical craving. Hypnosis, support groups, and medicines help break the habit of smoking. TIPS TO GET OFF AND STAY OFF CIGARETTES  Learn to predict your moods. Do not let a bad situation be your excuse to have a cigarette. Some situations in your life might tempt you to have a cigarette.  Ask friends and co-workers not to smoke around you.  Make your home smoke-free.  Never have "just one" cigarette. It leads to wanting another and another. Remind yourself of your decision to quit.  On a card, make a list of your reasons for not smoking. Read it at least the same number of times a day as you have a cigarette. Tell yourself everyday, "I do not want to smoke. I choose not to smoke."  Ask someone at home or work to help you with your plan to quit smoking.  Have something planned after you eat or have a cup of coffee. Take a walk or get other exercise to perk you up. This will help to keep you from overeating.  Try a relaxation exercise to calm you down and decrease your stress. Remember, you may be tense and nervous the first two weeks after you quit. This will pass.  Find new activities to keep your hands busy. Play with a pen, coin, or rubber band. Doodle or draw things on paper.  Brush your teeth right after eating. This will help cut down the craving for the taste of tobacco after meals. You can try mouthwash too.  Try gum, breath mints, or diet candy to keep something in your mouth. IF YOU SMOKE AND WANT TO QUIT:  Do not stock up on cigarettes. Never buy a carton. Wait until one pack is finished before you buy another.  Never carry cigarettes with you at work or at home.  Keep cigarettes  as far away from you as possible. Leave them with someone else.  Never carry matches or a lighter with you.  Ask yourself, "Do I need this cigarette or is this just a reflex?"  Bet with someone that you can quit. Put cigarette money in a piggy bank every morning. If you smoke, you give up the money. If you do not smoke, by the end of the week, you keep the money.  Keep trying. It takes 21 days to change a habit!  Talk to your doctor about using medicines to help you quit. These include nicotine replacement gum, lozenges, or skin patches. Document Released: 02/02/2009 Document Revised: 07/01/2011 Document Reviewed: 02/02/2009 [facility] Patient Information 2015 [place], [place]. This information is not intended to replace advice given to you by your health care provider. Make sure you discuss any questions you have with your health care provider. You Can Quit Smoking If you are ready to quit smoking or are thinking about it, congratulations! You have chosen to help yourself be healthier and live longer! There are lots of different ways to quit smoking. Nicotine gum, nicotine patches, a nicotine inhaler, or nicotine nasal spray can help with physical craving. Hypnosis, support groups, and medicines help break the habit of smoking. TIPS TO GET OFF AND STAY OFF CIGARETTES  Learn to predict your moods. Do not let a bad situation  be your excuse to have a cigarette. Some situations in your life might tempt you to have a cigarette.  Ask friends and co-workers not to smoke around you.  Make your home smoke-free.  Never have "just one" cigarette. It leads to wanting another and another. Remind yourself of your decision to quit.  On a card, make a list of your reasons for not smoking. Read it at least the same number of times a day as you have a cigarette. Tell yourself everyday, "I do not want to smoke. I choose not to smoke."  Ask someone at home or work to help you with your plan to quit  smoking.  Have something planned after you eat or have a cup of coffee. Take a walk or get other exercise to perk you up. This will help to keep you from overeating.  Try a relaxation exercise to calm you down and decrease your stress. Remember, you may be tense and nervous the first two weeks after you quit. This will pass.  Find new activities to keep your hands busy. Play with a pen, coin, or rubber band. Doodle or draw things on paper.  Brush your teeth right after eating. This will help cut down the craving for the taste of tobacco after meals. You can try mouthwash too.  Try gum, breath mints, or diet candy to keep something in your mouth. IF YOU SMOKE AND WANT TO QUIT:  Do not stock up on cigarettes. Never buy a carton. Wait until one pack is finished before you buy another.  Never carry cigarettes with you at work or at home.  Keep cigarettes as far away from you as possible. Leave them with someone else.  Never carry matches or a lighter with you.  Ask yourself, "Do I need this cigarette or is this just a reflex?"  Bet with someone that you can quit. Put cigarette money in a piggy bank every morning. If you smoke, you give up the money. If you do not smoke, by the end of the week, you keep the money.  Keep trying. It takes 21 days to change a habit!  Talk to your doctor about using medicines to help you quit. These include nicotine replacement gum, lozenges, or skin patches. Document Released: 02/02/2009 Document Revised: 07/01/2011 Document Reviewed: 02/02/2009 [facility] Patient Information 2015 [place], [place]. This information is not intended to replace advice given to you by your health care provider. Make sure you discuss any questions you have with your health care provider. You Can Quit Smoking If you are ready to quit smoking or are thinking about it, congratulations! You have chosen to help yourself be healthier and live longer! There are lots of different ways to quit  smoking. Nicotine gum, nicotine patches, a nicotine inhaler, or nicotine nasal spray can help with physical craving. Hypnosis, support groups, and medicines help break the habit of smoking. TIPS TO GET OFF AND STAY OFF CIGARETTES  Learn to predict your moods. Do not let a bad situation be your excuse to have a cigarette. Some situations in your life might tempt you to have a cigarette.  Ask friends and co-workers not to smoke around you.  Make your home smoke-free.  Never have "just one" cigarette. It leads to wanting another and another. Remind yourself of your decision to quit.  On a card, make a list of your reasons for not smoking. Read it at least the same number of times a day as you have a cigarette.  Tell yourself everyday, "I do not want to smoke. I choose not to smoke."  Ask someone at home or work to help you with your plan to quit smoking.  Have something planned after you eat or have a cup of coffee. Take a walk or get other exercise to perk you up. This will help to keep you from overeating.  Try a relaxation exercise to calm you down and decrease your stress. Remember, you may be tense and nervous the first two weeks after you quit. This will pass.  Find new activities to keep your hands busy. Play with a pen, coin, or rubber band. Doodle or draw things on paper.  Brush your teeth right after eating. This will help cut down the craving for the taste of tobacco after meals. You can try mouthwash too.  Try gum, breath mints, or diet candy to keep something in your mouth. IF YOU SMOKE AND WANT TO QUIT:  Do not stock up on cigarettes. Never buy a carton. Wait until one pack is finished before you buy another.  Never carry cigarettes with you at work or at home.  Keep cigarettes as far away from you as possible. Leave them with someone else.  Never carry matches or a lighter with you.  Ask yourself, "Do I need this cigarette or is this just a reflex?"  Bet with someone  that you can quit. Put cigarette money in a piggy bank every morning. If you smoke, you give up the money. If you do not smoke, by the end of the week, you keep the money.  Keep trying. It takes 21 days to change a habit!  Talk to your doctor about using medicines to help you quit. These include nicotine replacement gum, lozenges, or skin patches. Document Released: 02/02/2009 Document Revised: 07/01/2011 Document Reviewed: 02/02/2009 [facility] Patient Information 2015 [place], [place]. This information is not intended to replace advice given to you by your health care provider. Make sure you discuss any questions you have with your health care provider. You Can Quit Smoking If you are ready to quit smoking or are thinking about it, congratulations! You have chosen to help yourself be healthier and live longer! There are lots of different ways to quit smoking. Nicotine gum, nicotine patches, a nicotine inhaler, or nicotine nasal spray can help with physical craving. Hypnosis, support groups, and medicines help break the habit of smoking. TIPS TO GET OFF AND STAY OFF CIGARETTES  Learn to predict your moods. Do not let a bad situation be your excuse to have a cigarette. Some situations in your life might tempt you to have a cigarette.  Ask friends and co-workers not to smoke around you.  Make your home smoke-free.  Never have "just one" cigarette. It leads to wanting another and another. Remind yourself of your decision to quit.  On a card, make a list of your reasons for not smoking. Read it at least the same number of times a day as you have a cigarette. Tell yourself everyday, "I do not want to smoke. I choose not to smoke."  Ask someone at home or work to help you with your plan to quit smoking.  Have something planned after you eat or have a cup of coffee. Take a walk or get other exercise to perk you up. This will help to keep you from overeating.  Try a relaxation exercise to calm you  down and decrease your stress. Remember, you may be tense and nervous the  first two weeks after you quit. This will pass.  Find new activities to keep your hands busy. Play with a pen, coin, or rubber band. Doodle or draw things on paper.  Brush your teeth right after eating. This will help cut down the craving for the taste of tobacco after meals. You can try mouthwash too.  Try gum, breath mints, or diet candy to keep something in your mouth. IF YOU SMOKE AND WANT TO QUIT:  Do not stock up on cigarettes. Never buy a carton. Wait until one pack is finished before you buy another.  Never carry cigarettes with you at work or at home.  Keep cigarettes as far away from you as possible. Leave them with someone else.  Never carry matches or a lighter with you.  Ask yourself, "Do I need this cigarette or is this just a reflex?"  Bet with someone that you can quit. Put cigarette money in a piggy bank every morning. If you smoke, you give up the money. If you do not smoke, by the end of the week, you keep the money.  Keep trying. It takes 21 days to change a habit!  Talk to your doctor about using medicines to help you quit. These include nicotine replacement gum, lozenges, or skin patches. Document Released: 02/02/2009 Document Revised: 07/01/2011 Document Reviewed: 02/02/2009 [facility] Patient Information 2015 [place], [place]. This information is not intended to replace advice given to you by your health care provider. Make sure you discuss any questions you have with your health care provider.

## 2014-03-30 NOTE — Progress Notes (Signed)
Subjective:     Mary Frey is a 38 y.o. female here for a routine exam.   Personal health questionnaire:  Is patient Mary Frey, have a family history of breast and/or ovarian cancer: yes Is there a family history of uterine cancer diagnosed at age < 19, gastrointestinal cancer, urinary tract cancer, family member who is a Field seismologist syndrome-associated carrier: no Is the patient overweight and hypertensive, family history of diabetes, personal history of gestational diabetes or PCOS: yes Is patient over 26, have PCOS,  family history of premature CHD under age 106, diabetes, smoke, have hypertension or peripheral artery disease:  yes At any time, has a partner hit, kicked or otherwise hurt or frightened you?: no Over the past 2 weeks, have you felt down, depressed or hopeless?: no Over the past 2 weeks, have you felt little interest or pleasure in doing things?:no   Gynecologic History Patient's last menstrual period was 03/14/2014.   Obstetric History OB History  Gravida Para Term Preterm AB SAB TAB Ectopic Multiple Living  5 3 3  2  1 1  3     # Outcome Date GA Lbr Len/2nd Weight Sex Delivery Anes PTL Lv  5 Term           4 Term           3 Term           2 TAB           1 Ectopic               Past Medical History  Diagnosis Date  . Chronic back pain   . Hypertension   . Pregnancy induced hypertension   . Anemia     Past Surgical History  Procedure Laterality Date  . No past surgeries      Current outpatient prescriptions: methimazole (TAPAZOLE) 10 MG tablet, Take 0.5 tablets (5 mg total) by mouth 2 (two) times daily., Disp: 30 tablet, Rfl: 0;  propranolol (INDERAL) 20 MG tablet, Take 1 tablet (20 mg total) by mouth 2 (two) times daily., Disp: 120 tablet, Rfl: 3;  thiamine 100 MG tablet, Take 1 tablet (100 mg total) by mouth daily., Disp: , Rfl: ;  adapalene (DIFFERIN) 0.1 % gel, Apply to face every other night, Disp: , Rfl:  folic acid (FOLVITE) 1 MG tablet, Take  1 tablet (1 mg total) by mouth daily. (Patient not taking: Reported on 03/30/2014), Disp: 30 tablet, Rfl: 2;  methimazole (TAPAZOLE) 10 MG tablet, Take 10 mg in the morning and 5 mg in the evening., Disp: 45 tablet, Rfl: 2;  Multiple Vitamin (MULTIVITAMIN WITH MINERALS) TABS tablet, Take 1 tablet by mouth daily. (Patient not taking: Reported on 03/30/2014), Disp: , Rfl:  norethindrone (MICRONOR,CAMILA,ERRIN) 0.35 MG tablet, Take 1 tablet (0.35 mg total) by mouth daily., Disp: 1 Package, Rfl: 11 Allergies  Allergen Reactions  . Hydrocodone Nausea And Vomiting  . Latex Itching and Rash    History  Substance Use Topics  . Smoking status: Current Some Day Smoker -- 14 years    Types: Cigarettes  . Smokeless tobacco: Not on file  . Alcohol Use: 0.0 oz/week    0 Not specified per week     Comment: on occasion    Family History  Problem Relation Age of Onset  . Diabetes Maternal Aunt   . Diabetes Maternal Uncle   . Diabetes Maternal Grandmother   . Diabetes Maternal Grandfather   . Stroke Paternal Grandfather  Review of Systems  Constitutional: negative for fatigue and weight loss Respiratory: negative for cough and wheezing Cardiovascular: negative for chest pain, fatigue and palpitations Gastrointestinal: negative for abdominal pain and change in bowel habits Musculoskeletal:negative for myalgias Neurological: negative for gait problems and tremors Behavioral/Psych: negative for abusive relationship, depression Endocrine: negative for temperature intolerance   Genitourinary:negative for abnormal menstrual periods, genital lesions, hot flashes, sexual problems and vaginal discharge Integument/breast: negative for breast lump, breast tenderness, nipple discharge and skin lesion(s)    Objective:       BP 150/99 mmHg  Pulse 79  Temp(Src) 98.4 F (36.9 C)  Wt 58.968 kg (130 lb)  LMP 03/14/2014 The patient declined an exam. Lab Review Urine pregnancy test Labs reviewed  no Radiologic studies reviewed no    Assessment:    S/P period of secondary amenorrhea in the setting of Graves disease, weight loss; menses have resumed Plan:   Exam next visit  Meds ordered this encounter  Medications  . norethindrone (MICRONOR,CAMILA,ERRIN) 0.35 MG tablet    Sig: Take 1 tablet (0.35 mg total) by mouth daily.    Dispense:  1 Package    Refill:  11   Orders Placed This Encounter  Procedures  . Prolactin  . RPR  . Follicle stimulating hormone  . Estradiol  . Hepatitis B surface antigen  . Hepatitis C antibody    Follow up as needed or in 1 mth

## 2014-03-31 ENCOUNTER — Telehealth: Payer: Self-pay

## 2014-03-31 LAB — RPR

## 2014-03-31 LAB — HEPATITIS C ANTIBODY: HCV Ab: NEGATIVE

## 2014-03-31 LAB — PROLACTIN: Prolactin: 5 ng/mL

## 2014-03-31 LAB — ESTRADIOL: Estradiol: 82.5 pg/mL

## 2014-03-31 LAB — HEPATITIS B SURFACE ANTIGEN: HEP B S AG: NEGATIVE

## 2014-03-31 LAB — FOLLICLE STIMULATING HORMONE: FSH: 6.4 m[IU]/mL

## 2014-03-31 NOTE — Telephone Encounter (Signed)
there was prior appt scheduled for 11/23 from referral to [facility] - that was cancelled - gave patient phone number to call them to reschedule with them

## 2014-04-01 ENCOUNTER — Encounter: Payer: Self-pay | Admitting: Obstetrics & Gynecology

## 2014-04-04 ENCOUNTER — Other Ambulatory Visit: Payer: Self-pay | Admitting: *Deleted

## 2014-04-04 DIAGNOSIS — E0500 Thyrotoxicosis with diffuse goiter without thyrotoxic crisis or storm: Secondary | ICD-10-CM

## 2014-04-05 ENCOUNTER — Telehealth: Payer: Self-pay

## 2014-04-05 NOTE — Telephone Encounter (Signed)
Dr. Chalmers Cater is no longer with [facility] - she is at [facility] and they do not take Medicaid [place]. [facility] does, called them and sent referral to Dr. Delrae Rend - patient is OK with this - spoke to her

## 2014-04-06 ENCOUNTER — Telehealth: Payer: Self-pay

## 2014-04-06 NOTE — Telephone Encounter (Signed)
soonest appt [facility] had was 07/20/13 at 2pm with Dr, Delrae Rend - let patient know

## 2014-04-07 ENCOUNTER — Telehealth: Payer: Self-pay

## 2014-04-07 NOTE — Telephone Encounter (Signed)
Patient called - very rude about schedule for endocrinology - [facility] could not see her until end of March - but that was soonest they had - she said she had "someone else" that she could see sooner and did her own research - told her that was fine, and I would send them info from here - she said no, then I asked if she wanted the 3/31 appt. cancelled and she said, no! She would call them. Stated I was very combative, Told her to let me know what she wanted to do, then told her I had to go.

## 2014-04-12 ENCOUNTER — Telehealth: Payer: Self-pay | Admitting: *Deleted

## 2014-04-12 NOTE — Telephone Encounter (Signed)
Patient has questions about follow up appointment. 3:32: Call patient - she is concerned about her birth control and estrogen level. Reviewed labs and patient questions are answered. Patient to review any other question at her next appointment.

## 2014-04-19 ENCOUNTER — Encounter: Payer: Self-pay | Admitting: Obstetrics & Gynecology

## 2014-05-02 ENCOUNTER — Ambulatory Visit: Payer: Medicaid Other | Admitting: Obstetrics

## 2014-05-19 ENCOUNTER — Ambulatory Visit: Payer: Medicaid Other | Admitting: Obstetrics

## 2014-07-20 ENCOUNTER — Telehealth: Payer: Self-pay

## 2014-07-20 NOTE — Telephone Encounter (Signed)
patient called about endocrinology referral - gave her their phone number and address - told her to call and confirm her appt for 07/21/14 at 2pm

## 2014-08-30 ENCOUNTER — Ambulatory Visit: Payer: Medicaid Other | Admitting: Certified Nurse Midwife

## 2014-09-20 ENCOUNTER — Ambulatory Visit: Payer: Medicaid Other | Admitting: Certified Nurse Midwife

## 2014-11-18 ENCOUNTER — Ambulatory Visit: Payer: Medicaid Other | Admitting: Certified Nurse Midwife

## 2015-03-15 ENCOUNTER — Emergency Department (HOSPITAL_COMMUNITY): Payer: Medicaid Other

## 2015-03-15 ENCOUNTER — Encounter (HOSPITAL_COMMUNITY): Payer: Self-pay | Admitting: Emergency Medicine

## 2015-03-15 ENCOUNTER — Emergency Department (HOSPITAL_COMMUNITY)
Admission: EM | Admit: 2015-03-15 | Discharge: 2015-03-16 | Disposition: A | Discharge status: Home / Self Care | Payer: Medicaid Other | Attending: Emergency Medicine | Admitting: Emergency Medicine

## 2015-03-15 DIAGNOSIS — Z3202 Encounter for pregnancy test, result negative: Secondary | ICD-10-CM | POA: Insufficient documentation

## 2015-03-15 DIAGNOSIS — G8929 Other chronic pain: Secondary | ICD-10-CM | POA: Insufficient documentation

## 2015-03-15 DIAGNOSIS — F17210 Nicotine dependence, cigarettes, uncomplicated: Secondary | ICD-10-CM | POA: Diagnosis not present

## 2015-03-15 DIAGNOSIS — B349 Viral infection, unspecified: Secondary | ICD-10-CM

## 2015-03-15 DIAGNOSIS — D72829 Elevated white blood cell count, unspecified: Secondary | ICD-10-CM

## 2015-03-15 DIAGNOSIS — I10 Essential (primary) hypertension: Secondary | ICD-10-CM | POA: Diagnosis not present

## 2015-03-15 DIAGNOSIS — R509 Fever, unspecified: Secondary | ICD-10-CM | POA: Diagnosis present

## 2015-03-15 DIAGNOSIS — R112 Nausea with vomiting, unspecified: Secondary | ICD-10-CM

## 2015-03-15 DIAGNOSIS — N730 Acute parametritis and pelvic cellulitis: Secondary | ICD-10-CM

## 2015-03-15 DIAGNOSIS — R197 Diarrhea, unspecified: Secondary | ICD-10-CM

## 2015-03-15 DIAGNOSIS — A599 Trichomoniasis, unspecified: Secondary | ICD-10-CM

## 2015-03-15 DIAGNOSIS — Z79899 Other long term (current) drug therapy: Secondary | ICD-10-CM | POA: Diagnosis not present

## 2015-03-15 DIAGNOSIS — D649 Anemia, unspecified: Secondary | ICD-10-CM | POA: Insufficient documentation

## 2015-03-15 DIAGNOSIS — R102 Pelvic and perineal pain unspecified side: Secondary | ICD-10-CM

## 2015-03-15 DIAGNOSIS — Z91040 Latex allergy status: Secondary | ICD-10-CM | POA: Insufficient documentation

## 2015-03-15 LAB — CBC WITH DIFFERENTIAL/PLATELET
BASOS ABS: 0 10*3/uL (ref 0.0–0.1)
BASOS PCT: 0 %
EOS ABS: 0 10*3/uL (ref 0.0–0.7)
Eosinophils Relative: 0 %
HEMATOCRIT: 26.1 % — AB (ref 36.0–46.0)
Hemoglobin: 7.6 g/dL — ABNORMAL LOW (ref 12.0–15.0)
LYMPHS ABS: 0.7 10*3/uL (ref 0.7–4.0)
Lymphocytes Relative: 3 %
MCH: 16.6 pg — AB (ref 26.0–34.0)
MCHC: 29.1 g/dL — ABNORMAL LOW (ref 30.0–36.0)
MCV: 57.1 fL — AB (ref 78.0–100.0)
MONOS PCT: 3 %
Monocytes Absolute: 0.7 10*3/uL (ref 0.1–1.0)
NEUTROS ABS: 20.3 10*3/uL — AB (ref 1.7–7.7)
Neutrophils Relative %: 94 %
PLATELETS: 481 10*3/uL — AB (ref 150–400)
RBC: 4.57 MIL/uL (ref 3.87–5.11)
RDW: 19.1 % — ABNORMAL HIGH (ref 11.5–15.5)
WBC: 21.7 10*3/uL — ABNORMAL HIGH (ref 4.0–10.5)

## 2015-03-15 LAB — URINE MICROSCOPIC-ADD ON

## 2015-03-15 LAB — PREGNANCY, URINE: Preg Test, Ur: NEGATIVE

## 2015-03-15 LAB — URINALYSIS, ROUTINE W REFLEX MICROSCOPIC
Bilirubin Urine: NEGATIVE
GLUCOSE, UA: NEGATIVE mg/dL
KETONES UR: NEGATIVE mg/dL
NITRITE: NEGATIVE
PH: 6 (ref 5.0–8.0)
Protein, ur: 30 mg/dL — AB
SPECIFIC GRAVITY, URINE: 1.019 (ref 1.005–1.030)

## 2015-03-15 LAB — COMPREHENSIVE METABOLIC PANEL
ALBUMIN: 4 g/dL (ref 3.5–5.0)
ALT: 23 U/L (ref 14–54)
AST: 33 U/L (ref 15–41)
Alkaline Phosphatase: 135 U/L — ABNORMAL HIGH (ref 38–126)
Anion gap: 12 (ref 5–15)
BILIRUBIN TOTAL: 1 mg/dL (ref 0.3–1.2)
BUN: 11 mg/dL (ref 6–20)
CO2: 21 mmol/L — ABNORMAL LOW (ref 22–32)
CREATININE: 0.75 mg/dL (ref 0.44–1.00)
Calcium: 9.1 mg/dL (ref 8.9–10.3)
Chloride: 100 mmol/L — ABNORMAL LOW (ref 101–111)
GFR calc Af Amer: >60 mL/min (ref >60)
GLUCOSE: 172 mg/dL — AB (ref 65–99)
Potassium: 3.7 mmol/L (ref 3.5–5.1)
Sodium: 133 mmol/L — ABNORMAL LOW (ref 135–145)
TOTAL PROTEIN: 9.3 g/dL — AB (ref 6.5–8.1)

## 2015-03-15 LAB — LIPASE, BLOOD: LIPASE: 27 U/L (ref 11–51)

## 2015-03-15 LAB — RAPID STREP SCREEN (MED CTR MEBANE ONLY): Streptococcus, Group A Screen (Direct): NEGATIVE

## 2015-03-15 MED ORDER — DEXTROSE 5 % IV SOLN
250.0000 mg | Freq: Once | INTRAVENOUS | Status: DC
  Filled 2015-03-15: qty 250

## 2015-03-15 MED ORDER — PROMETHAZINE HCL 25 MG/ML IJ SOLN
12.5000 mg | Freq: Once | INTRAMUSCULAR | Status: AC
  Administered 2015-03-15: 12.5 mg via INTRAVENOUS
  Filled 2015-03-15: qty 1

## 2015-03-15 MED ORDER — SODIUM CHLORIDE 0.9 % IV BOLUS (SEPSIS)
1000.0000 mL | Freq: Once | INTRAVENOUS | Status: AC
  Administered 2015-03-15: 1000 mL via INTRAVENOUS

## 2015-03-15 MED ORDER — ONDANSETRON HCL 4 MG/2ML IJ SOLN
4.0000 mg | Freq: Once | INTRAMUSCULAR | Status: AC
  Administered 2015-03-15: 4 mg via INTRAVENOUS
  Filled 2015-03-15: qty 2

## 2015-03-15 MED ORDER — METOCLOPRAMIDE HCL 5 MG/ML IJ SOLN
10.0000 mg | Freq: Once | INTRAMUSCULAR | Status: AC
  Administered 2015-03-16: 10 mg via INTRAVENOUS
  Filled 2015-03-15: qty 2

## 2015-03-15 MED ORDER — METRONIDAZOLE 500 MG PO TABS
500.0000 mg | ORAL_TABLET | Freq: Once | ORAL | Status: AC
  Administered 2015-03-16: 500 mg via ORAL
  Filled 2015-03-15: qty 1

## 2015-03-15 MED ORDER — SODIUM CHLORIDE 0.9 % IV BOLUS (SEPSIS): 1000.0000 mL | Freq: Once | INTRAVENOUS | Status: DC

## 2015-03-15 MED ORDER — DOXYCYCLINE HYCLATE 100 MG PO TABS
100.0000 mg | ORAL_TABLET | Freq: Once | ORAL | Status: AC
  Administered 2015-03-16: 100 mg via ORAL
  Filled 2015-03-15: qty 1

## 2015-03-15 MED ORDER — KETOROLAC TROMETHAMINE 30 MG/ML IJ SOLN
30.0000 mg | Freq: Once | INTRAMUSCULAR | Status: AC
  Administered 2015-03-15: 30 mg via INTRAVENOUS
  Filled 2015-03-15: qty 1

## 2015-03-15 MED ORDER — ACETAMINOPHEN 500 MG PO TABS
1000.0000 mg | ORAL_TABLET | Freq: Once | ORAL | Status: AC
  Administered 2015-03-15: 1000 mg via ORAL
  Filled 2015-03-15: qty 2

## 2015-03-15 NOTE — ED Notes (Signed)
C/o cough, fever, chills, generalized body aches, fatigue, vomiting since Friday. Tachycardic at 135 in triage. Complaining of back and stomach pain in triage.

## 2015-03-15 NOTE — ED Provider Notes (Signed)
CSN: [id number]     Arrival date & time 03/15/15  1721 History   First MD Initiated Contact with Patient 03/15/15 1741     Chief Complaint  Patient presents with  . Generalized Body Aches  . Fever     (Consider location/radiation/quality/duration/timing/severity/associated sxs/prior Treatment) HPI Comments: 39 year old female with history of anemia, hypertension, Graves' disease, chronic back pain who presents with cough, sore throat, body aches, fever, vomiting. The patient states that 3-4 days ago she began having a sore throat associated with cough, runny nose, and nausea. Her symptoms have worsened and she has had vomiting and diarrhea for the past few days as well as fatigue, generalized body aches, and headache. She endorses generalized abd pain and back pain. She denies any urinary symptoms or blood in stool. No sick contacts but she was around kids recently. No rashl.  Patient is a 39 y.o. female presenting with fever. The history is provided by the patient.  Fever   Past Medical History  Diagnosis Date  . Chronic back pain   . Hypertension   . Pregnancy induced hypertension   . Anemia    Past Surgical History  Procedure Laterality Date  . No past surgeries     Family History  Problem Relation Age of Onset  . Diabetes Maternal Aunt   . Diabetes Maternal Uncle   . Diabetes Maternal Grandmother   . Diabetes Maternal Grandfather   . Stroke Paternal Grandfather    Social History  Substance Use Topics  . Smoking status: Current Some Day Smoker -- 14 years    Types: Cigarettes  . Smokeless tobacco: None  . Alcohol Use: 0.0 oz/week    0 Standard drinks or equivalent per week     Comment: on occasion   OB History    Gravida Para Term Preterm AB TAB SAB Ectopic Multiple Living   5 3 3  2 1  1  3      Review of Systems  Constitutional: Positive for fever.   10 Systems reviewed and are negative for acute change except as noted in the HPI.    Allergies   Hydrocodone and Latex  Home Medications   Prior to Admission medications   Medication Sig Start Date End Date Taking? Authorizing Provider  Cyanocobalamin (B-12 PO) Take 1 tablet by mouth daily.   Yes Historical Provider, MD  ENSURE (ENSURE) Take 237 mLs by mouth daily.   Yes Historical Provider, MD  ibuprofen (ADVIL,MOTRIN) 200 MG tablet Take 600 mg by mouth every 6 (six) hours as needed for headache or moderate pain.   Yes Historical Provider, MD  methimazole (TAPAZOLE) 10 MG tablet Take 0.5 tablets (5 mg total) by mouth 2 (two) times daily. Patient taking differently: Take 10 mg by mouth 2 (two) times daily.  11/16/13  Yes Philemon Kingdom, MD  PE-DM-APAP & Doxylamin-DM-APAP (VICKS DAYQUIL/NYQUIL CLD & FLU) (LIQUID) MISC Take 1 Dose by mouth daily as needed (cold symptoms).   Yes Historical Provider, MD  doxycycline (VIBRAMYCIN) 100 MG capsule Take 1 capsule (100 mg total) by mouth 2 (two) times daily. 03/16/15   Sharlett Iles, MD  folic acid (FOLVITE) 1 MG tablet Take 1 tablet (1 mg total) by mouth daily. Patient not taking: Reported on 03/30/2014 02/23/13   Philemon Kingdom, MD  methimazole (TAPAZOLE) 10 MG tablet Take 10 mg in the morning and 5 mg in the evening. 12/24/13   Philemon Kingdom, MD  metroNIDAZOLE (FLAGYL) 500 MG tablet Take 1 tablet (500 mg  total) by mouth 2 (two) times daily. 03/16/15   Sharlett Iles, MD  Multiple Vitamin (MULTIVITAMIN WITH MINERALS) TABS tablet Take 1 tablet by mouth daily. Patient not taking: Reported on 03/30/2014 01/14/13   Erline Hau, MD  norethindrone (MICRONOR,CAMILA,ERRIN) 0.35 MG tablet Take 1 tablet (0.35 mg total) by mouth daily. 03/30/14   Lahoma Crocker, MD  promethazine (PHENERGAN) 25 MG tablet Take 1 tablet (25 mg total) by mouth every 6 (six) hours as needed for nausea or vomiting. 03/16/15   Sharlett Iles, MD  propranolol (INDERAL) 20 MG tablet Take 1 tablet (20 mg total) by mouth 2 (two) times daily. 08/12/13    Philemon Kingdom, MD  thiamine 100 MG tablet Take 1 tablet (100 mg total) by mouth daily. 01/14/13   Erline Hau, MD   BP 128/76 mmHg  Pulse 106  Temp(Src) 99.9 F (37.7 C) (Oral)  Resp 25  SpO2 100%  LMP 03/08/2015 Physical Exam  Constitutional: She is oriented to person, place, and time. She appears well-developed and well-nourished.  Uncomfortable, ill appearing but non-toxic and NAD  HENT:  Head: Normocephalic and atraumatic.  dry mucous membranes, tonsillar exudates b/l; no tonsillar asymmetry  Eyes: Conjunctivae are normal. Pupils are equal, round, and reactive to light.  Neck: Normal range of motion. Neck supple.  Cardiovascular: Normal rate, regular rhythm and normal heart sounds.   No murmur heard. Pulmonary/Chest: Effort normal and breath sounds normal. No respiratory distress.  Abdominal: Soft. Bowel sounds are normal. She exhibits no distension.  Generalized TTP, no rebound or guarding  Genitourinary:  Small amount malodorous blood in vaginal vault, + cervical motion tenderness, no adnexal tenderness  Musculoskeletal: She exhibits no edema.  Lymphadenopathy:    She has cervical adenopathy.  Neurological: She is alert and oriented to person, place, and time. She exhibits normal muscle tone.  Fluent speech  Skin: Skin is warm and dry. No rash noted.  Psychiatric:  Bizarre affect  Nursing note and vitals reviewed. Chaperone was present during exam.   ED Course  Procedures (including critical care time) Labs Review Labs Reviewed  COMPREHENSIVE METABOLIC PANEL - Abnormal; Notable for the following:    Sodium 133 (*)    Chloride 100 (*)    CO2 21 (*)    Glucose, Bld 172 (*)    Total Protein 9.3 (*)    Alkaline Phosphatase 135 (*)    All other components within normal limits  CBC WITH DIFFERENTIAL/PLATELET - Abnormal; Notable for the following:    WBC 21.7 (*)    Hemoglobin 7.6 (*)    HCT 26.1 (*)    MCV 57.1 (*)    MCH 16.6 (*)    MCHC 29.1  (*)    RDW 19.1 (*)    Platelets 481 (*)    Neutro Abs 20.3 (*)    All other components within normal limits  URINALYSIS, ROUTINE W REFLEX MICROSCOPIC (NOT AT [facility]) - Abnormal; Notable for the following:    APPearance CLOUDY (*)    Hgb urine dipstick MODERATE (*)    Protein, ur 30 (*)    Leukocytes, UA SMALL (*)    All other components within normal limits  URINE MICROSCOPIC-ADD ON - Abnormal; Notable for the following:    Squamous Epithelial / LPF 6-30 (*)    Bacteria, UA FEW (*)    All other components within normal limits  RAPID STREP SCREEN (NOT AT [facility])  CULTURE, GROUP A STREP  CULTURE, BLOOD (ROUTINE X  2)  CULTURE, BLOOD (ROUTINE X 2)  URINE CULTURE  LIPASE, BLOOD  PREGNANCY, URINE  HIV ANTIBODY (ROUTINE TESTING)  GC/CHLAMYDIA PROBE AMP ([place]) NOT AT [facility]    Imaging Review Dg Chest 2 View  03/15/2015  CLINICAL DATA:  Cough and fever EXAM: CHEST - 2 VIEW COMPARISON:  None. FINDINGS: The heart size and mediastinal contours are within normal limits. Both lungs are clear. The visualized skeletal structures are unremarkable. IMPRESSION: No active disease. Electronically Signed   By: Inez Catalina M.D.   On: 03/15/2015 19:05   US Pelvis Complete  03/15/2015  CLINICAL DATA:  Pelvic pain. Track a benign cyst. Leukocytosis. Patient was unable to tolerate transvaginal examination due to vomiting and unable to lay flat. EXAM: TRANSABDOMINAL ULTRASOUND OF PELVIS TECHNIQUE: Transabdominal ultrasound examination of the pelvis was performed including evaluation of the uterus, ovaries, adnexal regions, and pelvic cul-de-sac. COMPARISON:  None. FINDINGS: Uterus Measurements: 10.9 x 5.7 x 7.5 cm. Uterus is anteverted. Nodular enlargement of the myometrium at the right posterior fundus consistent with fibroid. Maximal diameter is 4 cm. Endometrium Thickness: 8 mm. Endometrium is displaced by the fibroid. No focal abnormality visualized. Right ovary Measurements: 3.3 x 2.4 x 2.1 cm. Normal  appearance/no adnexal mass. Left ovary Measurements: 3.6 x 2.2 x 2.5 cm. Normal appearance/no adnexal mass. Other findings:  Small amount of free fluid in the pelvis. IMPRESSION: Uterine fibroid. Normal endometrial stripe thickness. Normal appearance of the ovaries. No abnormal adnexal masses. Small amount of free fluid in the pelvis is likely physiologic. Electronically Signed   By: Lucienne Capers M.D.   On: 03/15/2015 22:11   I have personally reviewed and evaluated these lab results as part of my medical decision-making.   EKG Interpretation None     Medications  sodium chloride 0.9 % bolus 1,000 mL (not administered)  cefTRIAXone (ROCEPHIN) 250 mg in dextrose 5 % 50 mL IVPB (not administered)  ondansetron (ZOFRAN) injection 4 mg (4 mg Intravenous Given 03/15/15 1807)  sodium chloride 0.9 % bolus 1,000 mL (0 mLs Intravenous Stopped 03/15/15 1919)  acetaminophen (TYLENOL) tablet 1,000 mg (1,000 mg Oral Given 03/15/15 1918)  sodium chloride 0.9 % bolus 1,000 mL (0 mLs Intravenous Stopped 03/15/15 2154)  ketorolac (TORADOL) 30 MG/ML injection 30 mg (30 mg Intravenous Given 03/15/15 2028)  promethazine (PHENERGAN) injection 12.5 mg (12.5 mg Intravenous Given 03/15/15 2050)  promethazine (PHENERGAN) injection 12.5 mg (12.5 mg Intravenous Given 03/15/15 2154)  metroNIDAZOLE (FLAGYL) tablet 500 mg (500 mg Oral Given 03/16/15 0021)  metoCLOPramide (REGLAN) injection 10 mg (10 mg Intravenous Given 03/16/15 0022)  doxycycline (VIBRA-TABS) tablet 100 mg (100 mg Oral Given 03/16/15 0021)  cefTRIAXone (ROCEPHIN) injection 250 mg (250 mg Intramuscular Given 03/16/15 0021)    MDM   Final diagnoses:  PID (acute pelvic inflammatory disease)  Non-intractable vomiting with nausea, vomiting of unspecified type  Diarrhea, unspecified type  Viral syndrome   Pt p/w several days of sore throat, cough, generalized body aches, fever, vomiting, and diarrhea. At presentation, the patient was awake, alert,  ill-appearing but nontoxic. She was tachycardic at triage but heart rate was normal during my examination. She did have a fever of 101. Normal work of breathing. She had generalized abdominal tenderness without rebound or guarding. Bilateral tonsillar exudates on exam. No meningismus. Obtained above labs. Chest x-ray was negative for acute process. Gave the patient Zofran, IV fluid bolus, and Tylenol.  Rapid strep was negative. Labs notable for WBC 21.7, hemoglobin 7.6, and platelets 481K.  UA positive for trichomonas. Pt states she had recent sexual encounter w/ ex-boyfriend. No discharge but continues to have lower abd pain on exam. Obtained pelvic US to r/o TOA given fever and leukocytosis. [place] negative. Pelvic exam showed cervical motion tenderness and malodor. Will treat empirically for PID w/ CTX, flagyl, doxycycline and discussed outpatient course of flagyl and doxy.   Pt reports h/o chronic anemia but is not currently on iron. No blood in stool. Pt reports heavy periods. I suspect anemia is due to chronic anemia rather than acute blood loss.  On re-examination after receiving IV fluids, Toradol, and several doses of antiemetics, the patient is ambulatory in the room. She has a bizarre affect but does admit to using marijuana today. Her chart shows a history of polysubstance abuse. She is able to answer questions and voiced understanding of treatment plan for PID as well as return precautions. She is tolerating crackers and ginger ale in the ED. Vital signs have improved. Provided with prescriptions for doxycycline, Flagyl, and Phenergan. Patient discharged in satisfactory condition.  Sharlett Iles, MD 03/16/15 [phone]

## 2015-03-15 NOTE — ED Notes (Signed)
Delay on 2nd set of blood cultures pt in bathroom

## 2015-03-16 ENCOUNTER — Telehealth (HOSPITAL_COMMUNITY): Payer: Self-pay | Admitting: *Deleted

## 2015-03-16 ENCOUNTER — Telehealth (HOSPITAL_COMMUNITY): Payer: Self-pay

## 2015-03-16 LAB — HIV ANTIBODY (ROUTINE TESTING W REFLEX): HIV Screen 4th Generation wRfx: NONREACTIVE

## 2015-03-16 MED ORDER — DOXYCYCLINE HYCLATE 100 MG PO CAPS: 100.0000 mg | ORAL_CAPSULE | Freq: Two times a day (BID) | ORAL | Status: DC

## 2015-03-16 MED ORDER — PROMETHAZINE HCL 25 MG PO TABS: 25.0000 mg | ORAL_TABLET | Freq: Four times a day (QID) | ORAL | Status: DC | PRN

## 2015-03-16 MED ORDER — METRONIDAZOLE 500 MG PO TABS: 500.0000 mg | ORAL_TABLET | Freq: Two times a day (BID) | ORAL | Status: DC

## 2015-03-16 MED ORDER — CEFTRIAXONE SODIUM 250 MG IJ SOLR
250.0000 mg | Freq: Once | INTRAMUSCULAR | Status: AC
  Administered 2015-03-16: 250 mg via INTRAMUSCULAR
  Filled 2015-03-16: qty 250

## 2015-03-16 NOTE — ED Notes (Signed)
Patient is alert and oriented x3.  She was given DC instructions and follow up visit instructions.  Patient gave verbal understanding and states that she had no questions. Patient asked if they were going to admit her on DC and she was informed that her issues were not an admitting cause . She was DC ambulatory under her own power to home.  V/S stable.  He was not showing any signs of distress on DC

## 2015-03-16 NOTE — Telephone Encounter (Signed)
Left VM requesting call back

## 2015-03-16 NOTE — Telephone Encounter (Signed)
Positive blood cultures- gram + cocci in chains.  Dr Jeanell Sparrow reviewed chart and advised to have pt return for further workup.

## 2015-03-17 LAB — URINE CULTURE: Special Requests: NORMAL

## 2015-03-17 LAB — GC/CHLAMYDIA PROBE AMP (~~LOC~~) NOT AT ARMC
Chlamydia: NEGATIVE
Neisseria Gonorrhea: NEGATIVE

## 2015-03-17 LAB — CULTURE, GROUP A STREP: Strep A Culture: NEGATIVE

## 2015-03-18 LAB — CULTURE, BLOOD (ROUTINE X 2)

## 2015-06-27 ENCOUNTER — Emergency Department (HOSPITAL_COMMUNITY)
Admission: EM | Admit: 2015-06-27 | Discharge: 2015-06-28 | Disposition: A | Discharge status: Home / Self Care | Payer: Medicaid Other | Attending: Emergency Medicine | Admitting: Emergency Medicine

## 2015-06-27 ENCOUNTER — Encounter (HOSPITAL_COMMUNITY): Payer: Self-pay | Admitting: Emergency Medicine

## 2015-06-27 DIAGNOSIS — F17210 Nicotine dependence, cigarettes, uncomplicated: Secondary | ICD-10-CM | POA: Insufficient documentation

## 2015-06-27 DIAGNOSIS — I10 Essential (primary) hypertension: Secondary | ICD-10-CM | POA: Insufficient documentation

## 2015-06-27 DIAGNOSIS — Z862 Personal history of diseases of the blood and blood-forming organs and certain disorders involving the immune mechanism: Secondary | ICD-10-CM | POA: Diagnosis not present

## 2015-06-27 DIAGNOSIS — E0500 Thyrotoxicosis with diffuse goiter without thyrotoxic crisis or storm: Secondary | ICD-10-CM | POA: Diagnosis not present

## 2015-06-27 DIAGNOSIS — Z3202 Encounter for pregnancy test, result negative: Secondary | ICD-10-CM | POA: Diagnosis not present

## 2015-06-27 DIAGNOSIS — Z91040 Latex allergy status: Secondary | ICD-10-CM | POA: Insufficient documentation

## 2015-06-27 DIAGNOSIS — A5903 Trichomonal cystitis and urethritis: Secondary | ICD-10-CM | POA: Diagnosis not present

## 2015-06-27 DIAGNOSIS — G8929 Other chronic pain: Secondary | ICD-10-CM | POA: Insufficient documentation

## 2015-06-27 DIAGNOSIS — R1030 Lower abdominal pain, unspecified: Secondary | ICD-10-CM | POA: Diagnosis present

## 2015-06-27 DIAGNOSIS — Z79899 Other long term (current) drug therapy: Secondary | ICD-10-CM | POA: Insufficient documentation

## 2015-06-27 HISTORY — DX: Thyrotoxicosis with diffuse goiter without thyrotoxic crisis or storm: E05.00

## 2015-06-27 LAB — COMPREHENSIVE METABOLIC PANEL
ALBUMIN: 3.8 g/dL (ref 3.5–5.0)
ALK PHOS: 97 U/L (ref 38–126)
ALT: 12 U/L — AB (ref 14–54)
AST: 21 U/L (ref 15–41)
Anion gap: 10 (ref 5–15)
BUN: 6 mg/dL (ref 6–20)
CALCIUM: 9.1 mg/dL (ref 8.9–10.3)
CO2: 24 mmol/L (ref 22–32)
CREATININE: 0.66 mg/dL (ref 0.44–1.00)
Chloride: 104 mmol/L (ref 101–111)
GFR calc non Af Amer: >60 mL/min (ref >60)
GLUCOSE: 115 mg/dL — AB (ref 65–99)
Potassium: 3.8 mmol/L (ref 3.5–5.1)
SODIUM: 138 mmol/L (ref 135–145)
Total Bilirubin: 0.1 mg/dL — ABNORMAL LOW (ref 0.3–1.2)
Total Protein: 8.8 g/dL — ABNORMAL HIGH (ref 6.5–8.1)

## 2015-06-27 LAB — CBC
HCT: 25.6 % — ABNORMAL LOW (ref 36.0–46.0)
Hemoglobin: 7.2 g/dL — ABNORMAL LOW (ref 12.0–15.0)
MCH: 16.4 pg — AB (ref 26.0–34.0)
MCHC: 28.1 g/dL — AB (ref 30.0–36.0)
MCV: 58.3 fL — ABNORMAL LOW (ref 78.0–100.0)
PLATELETS: 329 10*3/uL (ref 150–400)
RBC: 4.39 MIL/uL (ref 3.87–5.11)
RDW: 19.4 % — ABNORMAL HIGH (ref 11.5–15.5)
WBC: 5.4 10*3/uL (ref 4.0–10.5)

## 2015-06-27 LAB — LIPASE, BLOOD: Lipase: 69 U/L — ABNORMAL HIGH (ref 11–51)

## 2015-06-27 NOTE — ED Notes (Signed)
Pt states she was diagnosed with an STD, she and her partner were both treated, but she states the "irritable" feeling has returned. Pt denies any discharge and has no urinary symptoms. Pt has c/o lower abdominal pain.   Pt also has a dx of grave's disease. Pt states she is on a waiting list for a new PCP and she ran out of her medication about a month ago. She feels as if her eyes are bulging and she feels tremulous.

## 2015-06-27 NOTE — ED Notes (Signed)
I-stat beta was not able to run due to not enough blood

## 2015-06-28 LAB — URINALYSIS, ROUTINE W REFLEX MICROSCOPIC
Bilirubin Urine: NEGATIVE
GLUCOSE, UA: NEGATIVE mg/dL
Hgb urine dipstick: NEGATIVE
Ketones, ur: NEGATIVE mg/dL
Nitrite: NEGATIVE
Protein, ur: NEGATIVE mg/dL
Specific Gravity, Urine: 1.021 (ref 1.005–1.030)
pH: 6 (ref 5.0–8.0)

## 2015-06-28 LAB — TSH: TSH: 0.533 u[IU]/mL (ref 0.350–4.500)

## 2015-06-28 LAB — T4, FREE: Free T4: 0.74 ng/dL (ref 0.61–1.12)

## 2015-06-28 LAB — URINE MICROSCOPIC-ADD ON

## 2015-06-28 LAB — PREGNANCY, URINE: PREG TEST UR: NEGATIVE

## 2015-06-28 MED ORDER — METHIMAZOLE 10 MG PO TABS: ORAL_TABLET | ORAL | Status: DC

## 2015-06-28 NOTE — ED Notes (Signed)
Pt asked to change into a gown but left clothes on underneath the gown. Pt asked again to get changed with nothing left on except for her panties. Pt changed but still has long sleeved shirt under gown along with pants. Pt refused pelvic exam.

## 2015-06-28 NOTE — ED Provider Notes (Signed)
CSN: [id number]     Arrival date & time 06/27/15  1725 History   First MD Initiated Contact with Patient 06/28/15 0214     Chief Complaint  Patient presents with  . Abdominal Pain  . Graves' Disease    pt ran out of her medication      (Consider location/radiation/quality/duration/timing/severity/associated sxs/prior Treatment) HPI Comments: 40 year old female with a history of hypertension and Graves' disease presents to the emergency department for evaluation of multiple complaints. Patient states that she has no skin about her thyroid as she has Graves' disease and has been off of her medication for 1 month. She states that she ran out of her medication and is switching primary care doctors. She is on a waiting list for a new PCP, but may not be able to be seen until April. Patient has noticed a feeling of dry mouth recently. She also states that she feels as though her eyes are bulging more than normal. She reports and unexpected weight loss of 10 pounds which occurred "really fast", however, patient is unable to state the approximate length of time over which this weight loss occurred.  The patient is also stating that she has been having some discomfort in her lower abdomen. She is unable to characterize this discomfort. She denies any vaginal bleeding or discharge. No urinary symptoms or fever. She states that she was treated for pelvic inflammatory disease in November. She completed a course of doxycycline, but never took the Flagyl prescribed to her. She had tested positive for trichomonas during this visit. She denies any new sexual partners and states that her sexual partner was tested and treated for gonorrhea and chlamydia following this visit and was negative. She denies concern for new STDs.  The history is provided by the patient. No language interpreter was used.    Past Medical History  Diagnosis Date  . Chronic back pain   . Hypertension   . Pregnancy induced hypertension    . Anemia   . Graves disease    Past Surgical History  Procedure Laterality Date  . No past surgeries     Family History  Problem Relation Age of Onset  . Diabetes Maternal Aunt   . Diabetes Maternal Uncle   . Diabetes Maternal Grandmother   . Diabetes Maternal Grandfather   . Stroke Paternal Grandfather    Social History  Substance Use Topics  . Smoking status: Light Tobacco Smoker -- 14 years    Types: Cigarettes  . Smokeless tobacco: None  . Alcohol Use: 0.0 oz/week    0 Standard drinks or equivalent per week     Comment: on occasion   OB History    Gravida Para Term Preterm AB TAB SAB Ectopic Multiple Living   5 3 3  2 1  1  3       Review of Systems  Constitutional: Positive for unexpected weight change (reports "I lost 10 pounds fast"). Negative for fever.  HENT: Negative for trouble swallowing.        +"dry mouth"  Eyes:       +"eyes bulging"  Cardiovascular: Negative for palpitations.  Gastrointestinal: Negative for vomiting.  Genitourinary: Negative for dysuria and vaginal discharge.  Neurological: Negative for syncope.  All other systems reviewed and are negative.   Allergies  Hydrocodone and Latex  Home Medications   Prior to Admission medications   Medication Sig Start Date End Date Taking? Authorizing Provider  Cyanocobalamin (B-12 PO) Take 1 tablet by mouth daily.  Yes Historical Provider, MD  methimazole (TAPAZOLE) 10 MG tablet Take 0.5 tablets (5 mg total) by mouth 2 (two) times daily. Patient taking differently: Take 10 mg by mouth daily.  11/16/13  Yes Philemon Kingdom, MD  PE-DM-APAP & Doxylamin-DM-APAP (VICKS DAYQUIL/NYQUIL CLD & FLU) (LIQUID) MISC Take 1 Dose by mouth daily as needed (cold symptoms).   Yes Historical Provider, MD  doxycycline (VIBRAMYCIN) 100 MG capsule Take 1 capsule (100 mg total) by mouth 2 (two) times daily. Patient not taking: Reported on 06/27/2015 03/16/15   Sharlett Iles, MD  folic acid (FOLVITE) 1 MG tablet  Take 1 tablet (1 mg total) by mouth daily. Patient not taking: Reported on 03/30/2014 02/23/13   Philemon Kingdom, MD  methimazole (TAPAZOLE) 10 MG tablet Take 10 mg in the morning and 5 mg in the evening. Patient not taking: Reported on 06/27/2015 12/24/13   Philemon Kingdom, MD  metroNIDAZOLE (FLAGYL) 500 MG tablet Take 1 tablet (500 mg total) by mouth 2 (two) times daily. Patient not taking: Reported on 06/27/2015 03/16/15   Sharlett Iles, MD  Multiple Vitamin (MULTIVITAMIN WITH MINERALS) TABS tablet Take 1 tablet by mouth daily. Patient not taking: Reported on 03/30/2014 01/14/13   Erline Hau, MD  norethindrone (MICRONOR,CAMILA,ERRIN) 0.35 MG tablet Take 1 tablet (0.35 mg total) by mouth daily. Patient not taking: Reported on 06/27/2015 03/30/14   Lahoma Crocker, MD  promethazine (PHENERGAN) 25 MG tablet Take 1 tablet (25 mg total) by mouth every 6 (six) hours as needed for nausea or vomiting. Patient not taking: Reported on 06/27/2015 03/16/15   Sharlett Iles, MD  propranolol (INDERAL) 20 MG tablet Take 1 tablet (20 mg total) by mouth 2 (two) times daily. Patient not taking: Reported on 06/27/2015 08/12/13   Philemon Kingdom, MD  thiamine 100 MG tablet Take 1 tablet (100 mg total) by mouth daily. Patient not taking: Reported on 06/27/2015 01/14/13   Erline Hau, MD   BP 152/61 mmHg  Pulse 79  Temp(Src) 98.1 F (36.7 C) (Oral)  Resp 16  Ht 5\' 5"  (1.651 m)  Wt 53.524 kg  BMI 19.64 kg/m2  SpO2 100%   Physical Exam  Constitutional: She is oriented to person, place, and time. She appears well-developed and well-nourished. No distress.  Nontoxic/nonseptic appearing. Patient in no acute distress.  HENT:  Head: Normocephalic and atraumatic.  Slightly dry mucous membranes  Eyes: Conjunctivae and EOM are normal. No scleral icterus.  Neck: Normal range of motion.  Cardiovascular: Normal rate, regular rhythm and intact distal pulses.   Pulmonary/Chest: Effort  normal. No respiratory distress. She has no wheezes.  Respirations even and unlabored.  Abdominal: Soft. She exhibits no distension. There is no tenderness. There is no rebound.  Soft, nontender abdomen. No masses or rigidity. No peritoneal signs.  Musculoskeletal: Normal range of motion.  Neurological: She is alert and oriented to person, place, and time. She exhibits normal muscle tone. Coordination normal.  Patient moves extremities without ataxia  Skin: Skin is warm and dry. No rash noted. She is not diaphoretic. No erythema. No pallor.  Psychiatric: She has a normal mood and affect. Her behavior is normal.  Nursing note and vitals reviewed.   ED Course  Procedures (including critical care time) Labs Review Labs Reviewed  LIPASE, BLOOD - Abnormal; Notable for the following:    Lipase 69 (*)    All other components within normal limits  COMPREHENSIVE METABOLIC PANEL - Abnormal; Notable for the following:  Glucose, Bld 115 (*)    Total Protein 8.8 (*)    ALT 12 (*)    Total Bilirubin 0.1 (*)    All other components within normal limits  CBC - Abnormal; Notable for the following:    Hemoglobin 7.2 (*)    HCT 25.6 (*)    MCV 58.3 (*)    MCH 16.4 (*)    MCHC 28.1 (*)    RDW 19.4 (*)    All other components within normal limits  URINALYSIS, ROUTINE W REFLEX MICROSCOPIC (NOT AT [facility])  I-STAT BETA HCG BLOOD, ED ([facility], [facility], [facility] ONLY)    Imaging Review No results found.   I have personally reviewed and evaluated these images and lab results as part of my medical decision-making.   EKG Interpretation None       0240 - Patient declines pelvic exam. She states that she has a primary care doctor who can do this; reports appt scheduled for next week. Patient also states that she has not been sexually active with any new partners since being seen in November. The partner with whom she was sexually active tested negative for gonorrhea and chlamydia.    MDM   Final diagnoses:   Trichomonal urethritis  Graves disease    40 year old female presents to the emergency department concerned about her Graves' disease. She has been off of her methimazole for 1 month as she is between primary care doctors. Patient was previously taking 10 mg in the morning and 5 mg in the evening of this medication. Her TSH today is low, but not out of the range of normal. Will restart the patient's methimazole. No findings to suggest thyroid storm today.  Patient also concerned about lower abdominal discomfort. This is very vague and patient has no reproducible pain or tenderness on exam. Abdomen is soft. Patient is afebrile. She was previously seen and treated for pelvic inflammatory disease, but neglected to take the Flagyl prescribed to her. She had tested positive for Trichomonas at this prior visit and tests positive for Trichomonas, again, today. I have advised the patient to take the Flagyl previously prescribed to her. She has this in her purse at bedside. Patient understands that her symptoms may be secondary to incomplete treatment of her PID. She declines a pelvic exam in the ED today for repeat testing of GC/Chlamydia.  No indication for further emergent workup. Patient advised to follow up with a primary care doctor for a recheck of symptoms. Return precautions discussed and provided. Patient discharged in satisfactory condition with no unaddressed concerns.   Filed Vitals:   06/27/15 1731 06/28/15 0230 06/28/15 0524  BP: 143/86 152/61 129/81  Pulse: 86 79 64  Temp: 98.3 F (36.8 C) 98.1 F (36.7 C) 98.1 F (36.7 C)  TempSrc: Oral Oral Oral  Resp: 14 16 14   Height: 5\' 5"  (1.651 m)    Weight: 53.524 kg    SpO2: 100% 100% 100%      Antonietta Breach, PA-C 06/28/15 [place], MD 06/28/15 [phone]

## 2015-06-28 NOTE — ED Notes (Signed)
Pt provided water, continues to be unable to provide urine sample

## 2015-06-28 NOTE — ED Notes (Signed)
PA at bedside.

## 2015-06-28 NOTE — Discharge Instructions (Signed)
Take Flagyl as previously prescribed to for treatment of Trichomonas. Take your thyroid medication as prescribed. Have your thyroid levels checked by your primary care doctor in 1 week. Also follow-up with your OB/GYN for evaluation of your abdominal discomfort, especially if symptoms persist. Return to the emergency department as needed if symptoms worsen.  Trichomoniasis Trichomoniasis is an infection caused by an organism called Trichomonas. The infection can affect both women and men. In women, the outer female genitalia and the vagina are affected. In men, the penis is mainly affected, but the prostate and other reproductive organs can also be involved. Trichomoniasis is a sexually transmitted infection (STI) and is most often passed to another person through sexual contact.  RISK FACTORS  Having unprotected sexual intercourse.  Having sexual intercourse with an infected partner. SIGNS AND SYMPTOMS  Symptoms of trichomoniasis in women include:  Abnormal gray-green frothy vaginal discharge.  Itching and irritation of the vagina.  Itching and irritation of the area outside the vagina. Symptoms of trichomoniasis in men include:   Penile discharge with or without pain.  Pain during urination. This results from inflammation of the urethra. DIAGNOSIS  Trichomoniasis may be found during a Pap test or physical exam. Your health care provider may use one of the following methods to help diagnose this infection:  Testing the pH of the vagina with a test tape.  Using a vaginal swab test that checks for the Trichomonas organism. A test is available that provides results within a few minutes.  Examining a urine sample.  Testing vaginal secretions. Your health care provider may test you for other STIs, including HIV. TREATMENT   You may be given medicine to fight the infection. Women should inform their health care provider if they could be or are pregnant. Some medicines used to treat the  infection should not be taken during pregnancy.  Your health care provider may recommend over-the-counter medicines or creams to decrease itching or irritation.  Your sexual partner will need to be treated if infected.  Your health care provider may test you for infection again 3 months after treatment. HOME CARE INSTRUCTIONS   Take medicines only as directed by your health care provider.  Take over-the-counter medicine for itching or irritation as directed by your health care provider.  Do not have sexual intercourse while you have the infection.  Women should not douche or wear tampons while they have the infection.  Discuss your infection with your partner. Your partner may have gotten the infection from you, or you may have gotten it from your partner.  Have your sex partner get examined and treated if necessary.  Practice safe, informed, and protected sex.  See your health care provider for other STI testing. SEEK MEDICAL CARE IF:   You still have symptoms after you finish your medicine.  You develop abdominal pain.  You have pain when you urinate.  You have bleeding after sexual intercourse.  You develop a rash.  Your medicine makes you sick or makes you throw up (vomit). MAKE SURE YOU:  Understand these instructions.  Will watch your condition.  Will get help right away if you are not doing well or get worse.   This information is not intended to replace advice given to you by your health care provider. Make sure you discuss any questions you have with your health care provider.   Document Released: 10/02/2000 Document Revised: 04/29/2014 Document Reviewed: 01/18/2013 Elsevier Interactive Patient Education [employer].

## 2015-06-29 LAB — URINE CULTURE

## 2016-01-17 ENCOUNTER — Emergency Department (HOSPITAL_COMMUNITY)
Admission: EM | Admit: 2016-01-17 | Discharge: 2016-01-17 | Disposition: A | Discharge status: Home / Self Care | Payer: Medicaid Other | Attending: Emergency Medicine | Admitting: Emergency Medicine

## 2016-01-17 ENCOUNTER — Encounter (HOSPITAL_COMMUNITY): Payer: Self-pay | Admitting: Emergency Medicine

## 2016-01-17 DIAGNOSIS — R509 Fever, unspecified: Secondary | ICD-10-CM | POA: Diagnosis present

## 2016-01-17 DIAGNOSIS — N739 Female pelvic inflammatory disease, unspecified: Secondary | ICD-10-CM | POA: Diagnosis not present

## 2016-01-17 DIAGNOSIS — F17210 Nicotine dependence, cigarettes, uncomplicated: Secondary | ICD-10-CM | POA: Insufficient documentation

## 2016-01-17 DIAGNOSIS — A599 Trichomoniasis, unspecified: Secondary | ICD-10-CM | POA: Insufficient documentation

## 2016-01-17 DIAGNOSIS — Z79899 Other long term (current) drug therapy: Secondary | ICD-10-CM | POA: Insufficient documentation

## 2016-01-17 DIAGNOSIS — N730 Acute parametritis and pelvic cellulitis: Secondary | ICD-10-CM

## 2016-01-17 DIAGNOSIS — I10 Essential (primary) hypertension: Secondary | ICD-10-CM | POA: Insufficient documentation

## 2016-01-17 LAB — URINALYSIS, ROUTINE W REFLEX MICROSCOPIC
BILIRUBIN URINE: NEGATIVE
GLUCOSE, UA: NEGATIVE mg/dL
Ketones, ur: NEGATIVE mg/dL
Nitrite: NEGATIVE
PROTEIN: 30 mg/dL — AB
SPECIFIC GRAVITY, URINE: 1.014 (ref 1.005–1.030)
pH: 6.5 (ref 5.0–8.0)

## 2016-01-17 LAB — WET PREP, GENITAL
Sperm: NONE SEEN
Yeast Wet Prep HPF POC: NONE SEEN

## 2016-01-17 LAB — URINE MICROSCOPIC-ADD ON

## 2016-01-17 LAB — POC URINE PREG, ED: PREG TEST UR: NEGATIVE

## 2016-01-17 MED ORDER — IBUPROFEN 800 MG PO TABS
800.0000 mg | ORAL_TABLET | Freq: Once | ORAL | Status: AC
  Administered 2016-01-17: 800 mg via ORAL
  Filled 2016-01-17: qty 1

## 2016-01-17 MED ORDER — DOXYCYCLINE HYCLATE 100 MG PO CAPS: 100.0000 mg | ORAL_CAPSULE | Freq: Two times a day (BID) | ORAL | 0 refills | Status: AC

## 2016-01-17 MED ORDER — DOXYCYCLINE HYCLATE 100 MG PO TABS
100.0000 mg | ORAL_TABLET | Freq: Once | ORAL | Status: AC
  Administered 2016-01-17: 100 mg via ORAL
  Filled 2016-01-17: qty 1

## 2016-01-17 MED ORDER — METRONIDAZOLE 500 MG PO TABS
2000.0000 mg | ORAL_TABLET | Freq: Once | ORAL | Status: AC
  Administered 2016-01-17: 2000 mg via ORAL
  Filled 2016-01-17: qty 4

## 2016-01-17 MED ORDER — CEFTRIAXONE SODIUM 250 MG IJ SOLR
250.0000 mg | Freq: Once | INTRAMUSCULAR | Status: AC
  Administered 2016-01-17: 250 mg via INTRAMUSCULAR
  Filled 2016-01-17: qty 250

## 2016-01-17 MED ORDER — FOSFOMYCIN TROMETHAMINE 3 G PO PACK: 3.0000 g | PACK | Freq: Once | ORAL | 0 refills | Status: AC

## 2016-01-17 MED ORDER — STERILE WATER FOR INJECTION IJ SOLN
INTRAMUSCULAR | Status: AC
  Administered 2016-01-17: 10 mL
  Filled 2016-01-17: qty 10

## 2016-01-17 MED ORDER — AZITHROMYCIN 250 MG PO TABS
1000.0000 mg | ORAL_TABLET | Freq: Once | ORAL | Status: AC
  Administered 2016-01-17: 1000 mg via ORAL
  Filled 2016-01-17: qty 4

## 2016-01-17 NOTE — ED Triage Notes (Signed)
Per EMS-states flu like symptoms for 2 days-no fever, nausea, body aches, weakness-no vomiting-poor PO intake-patient started 2 new meds and doesn't know if they are causing symptoms

## 2016-01-17 NOTE — ED Notes (Signed)
MD at bedside. 

## 2016-01-17 NOTE — ED Provider Notes (Signed)
[place] DEPT Provider Note   CSN: [id number] Arrival date & time: 01/17/16  [id number]     History   Chief Complaint Chief Complaint  Patient presents with  . flu like symptoms    HPI Mary Frey is a 40 y.o. female.  The history is provided by the patient.  Fever   This is a new problem. The current episode started 2 days ago. The problem occurs constantly. The problem has been gradually worsening. Her temperature was unmeasured prior to arrival. Associated symptoms include muscle aches. Pertinent negatives include no diarrhea and no vomiting. Associated symptoms comments: Vaginal discharge, pelvic pain. She has tried acetaminophen for the symptoms.    Past Medical History:  Diagnosis Date  . Anemia   . Chronic back pain   . Graves disease   . Hypertension   . Pregnancy induced hypertension     Patient Active Problem List   Diagnosis Date Noted  . Cocaine abuse 01/12/2013  . Protein-calorie malnutrition, severe ([place]) 01/12/2013  . Graves disease 01/12/2013  . Acute systolic CHF (congestive heart failure) ([place]) 01/12/2013  . HTN (hypertension) 01/12/2013  . Microcytic anemia 01/12/2013  . Tobacco use disorder 01/12/2013    Past Surgical History:  Procedure Laterality Date  . NO PAST SURGERIES      OB History    Gravida Para Term Preterm AB Living   5 3 3   2 3    SAB TAB Ectopic Multiple Live Births     1 1           Home Medications    Prior to Admission medications   Medication Sig Start Date End Date Taking? Authorizing Provider  acetaminophen (TYLENOL) 500 MG tablet Take 1,500 mg by mouth every 6 (six) hours as needed for moderate pain.   Yes Historical Provider, MD  Cyanocobalamin (B-12 PO) Take 1 tablet by mouth daily.   Yes Historical Provider, MD  methimazole (TAPAZOLE) 10 MG tablet Take 10 mg in the morning and 5 mg in the evening. 06/28/15  Yes Antonietta Breach, PA-C  doxycycline (VIBRAMYCIN) 100 MG capsule Take 1 capsule (100 mg total) by mouth  2 (two) times daily. 01/17/16 01/31/16  Leo Grosser, MD  folic acid (FOLVITE) 1 MG tablet Take 1 tablet (1 mg total) by mouth daily. Patient not taking: Reported on 03/30/2014 02/23/13   Philemon Kingdom, MD  fosfomycin (MONUROL) 3 g PACK Take 3 g by mouth once. 01/17/16 01/17/16  Leo Grosser, MD  Multiple Vitamin (MULTIVITAMIN WITH MINERALS) TABS tablet Take 1 tablet by mouth daily. Patient not taking: Reported on 03/30/2014 01/14/13   Erline Hau, MD  norethindrone (MICRONOR,CAMILA,ERRIN) 0.35 MG tablet Take 1 tablet (0.35 mg total) by mouth daily. Patient not taking: Reported on 06/27/2015 03/30/14   Lahoma Crocker, MD  propranolol (INDERAL) 20 MG tablet Take 1 tablet (20 mg total) by mouth 2 (two) times daily. Patient not taking: Reported on 06/27/2015 08/12/13   Philemon Kingdom, MD    Family History Family History  Problem Relation Age of Onset  . Diabetes Maternal Aunt   . Diabetes Maternal Uncle   . Diabetes Maternal Grandmother   . Diabetes Maternal Grandfather   . Stroke Paternal Grandfather     Social History Social History  Substance Use Topics  . Smoking status: Light Tobacco Smoker    Years: 14.00    Types: Cigarettes  . Smokeless tobacco: Never Used  . Alcohol use 0.0 oz/week     Comment: on occasion  Allergies   Hydrocodone and Latex   Review of Systems Review of Systems  Constitutional: Positive for fever.  Gastrointestinal: Negative for diarrhea and vomiting.  All other systems reviewed and are negative.    Physical Exam Updated Vital Signs BP 134/91 (BP Location: Left Arm)   Pulse 87   Temp 98.4 F (36.9 C) (Oral)   Resp 16   Ht 5\' 5"  (1.651 m)   Wt 118 lb (53.5 kg)   LMP 12/23/2015   SpO2 100%   BMI 19.64 kg/m   Physical Exam  Constitutional: She is oriented to person, place, and time. She appears well-developed and well-nourished. No distress.  HENT:  Head: Normocephalic.  Eyes: Conjunctivae are normal.  Neck: Neck  supple. No tracheal deviation present.  Cardiovascular: Normal rate, regular rhythm and normal heart sounds.   Pulmonary/Chest: Effort normal and breath sounds normal. No respiratory distress.  Abdominal: Soft. She exhibits no distension. There is tenderness in the suprapubic area. There is no rigidity, no rebound and no guarding.  Genitourinary: Cervix exhibits motion tenderness. Right adnexum displays no tenderness. Left adnexum displays no tenderness. No bleeding in the vagina. Vaginal discharge (foul yellow thick) found.  Neurological: She is alert and oriented to person, place, and time.  Skin: Skin is warm and dry.  Psychiatric: She has a normal mood and affect.  Vitals reviewed.    ED Treatments / Results  Labs (all labs ordered are listed, but only abnormal results are displayed) Labs Reviewed  WET PREP, GENITAL - Abnormal; Notable for the following:       Result Value   Trich, Wet Prep PRESENT (*)    Clue Cells Wet Prep HPF POC PRESENT (*)    WBC, Wet Prep HPF POC FEW (*)    All other components within normal limits  URINALYSIS, ROUTINE W REFLEX MICROSCOPIC (NOT AT [facility]) - Abnormal; Notable for the following:    APPearance CLOUDY (*)    Hgb urine dipstick SMALL (*)    Protein, ur 30 (*)    Leukocytes, UA LARGE (*)    All other components within normal limits  URINE MICROSCOPIC-ADD ON - Abnormal; Notable for the following:    Squamous Epithelial / LPF 0-5 (*)    Bacteria, UA MANY (*)    All other components within normal limits  URINE CULTURE  POC URINE PREG, ED  GC/CHLAMYDIA PROBE AMP ([place]) NOT AT [facility]    EKG  EKG Interpretation None       Radiology No results found.  Procedures Procedures (including critical care time)  Medications Ordered in ED Medications  ibuprofen (ADVIL,MOTRIN) tablet 800 mg (800 mg Oral Given 01/17/16 1008)  cefTRIAXone (ROCEPHIN) injection 250 mg (250 mg Intramuscular Given 01/17/16 1331)  azithromycin (ZITHROMAX) tablet  1,000 mg (1,000 mg Oral Given 01/17/16 1328)  doxycycline (VIBRA-TABS) tablet 100 mg (100 mg Oral Given 01/17/16 1328)  sterile water (preservative free) injection (10 mLs  Given 01/17/16 1331)  metroNIDAZOLE (FLAGYL) tablet 2,000 mg (2,000 mg Oral Given 01/17/16 1419)     Initial Impression / Assessment and Plan / ED Course  I have reviewed the triage vital signs and the nursing notes.  Pertinent labs & imaging results that were available during my care of the patient were reviewed by me and considered in my medical decision making (see chart for details).  Clinical Course    40 y.o. female presents with diffuse body aches and low grade fever. She is concerned she was prescribed "antibiotics" for  UTI yesterday and she is not improving. She produces a bottle of estrogen supplementation. Secondary complaint of vaginal dc and requesting STI check. Pt with clinical symptoms of PID and worsening infection. Covered for GC/Chlamydia empirically and trich as noted on wet prep. Full course of doxy for PID and fosfomycin to cover for UTI. Plan to follow up with PCP as needed and return precautions discussed for worsening or new concerning symptoms.   Final Clinical Impressions(s) / ED Diagnoses   Final diagnoses:  PID (acute pelvic inflammatory disease)  Trichomoniasis    New Prescriptions Discharge Medication List as of 01/17/2016  2:19 PM    START taking these medications   Details  doxycycline (VIBRAMYCIN) 100 MG capsule Take 1 capsule (100 mg total) by mouth 2 (two) times daily., Starting Wed 01/17/2016, Until Wed 01/31/2016, Print    fosfomycin (MONUROL) 3 g PACK Take 3 g by mouth once., Starting Wed 01/17/2016, Print         Leo Grosser, MD 01/17/16 [id number]

## 2016-01-17 NOTE — ED Notes (Signed)
Pt reports taking 3 tylenol 500 mg at 3 Am. Presents with fever 100.1

## 2016-01-18 LAB — GC/CHLAMYDIA PROBE AMP (~~LOC~~) NOT AT ARMC
Chlamydia: NEGATIVE
NEISSERIA GONORRHEA: NEGATIVE

## 2016-01-18 LAB — URINE CULTURE

## 2016-04-15 ENCOUNTER — Encounter (HOSPITAL_COMMUNITY): Payer: Self-pay | Admitting: Emergency Medicine

## 2016-04-15 ENCOUNTER — Emergency Department (HOSPITAL_COMMUNITY)
Admission: EM | Admit: 2016-04-15 | Discharge: 2016-04-15 | Discharge status: Against Medical Advice | Payer: Medicaid Other | Attending: Emergency Medicine | Admitting: Emergency Medicine

## 2016-04-15 DIAGNOSIS — F419 Anxiety disorder, unspecified: Secondary | ICD-10-CM | POA: Insufficient documentation

## 2016-04-15 DIAGNOSIS — I110 Hypertensive heart disease with heart failure: Secondary | ICD-10-CM | POA: Diagnosis not present

## 2016-04-15 DIAGNOSIS — I5021 Acute systolic (congestive) heart failure: Secondary | ICD-10-CM | POA: Diagnosis not present

## 2016-04-15 DIAGNOSIS — Z91040 Latex allergy status: Secondary | ICD-10-CM | POA: Insufficient documentation

## 2016-04-15 DIAGNOSIS — F17210 Nicotine dependence, cigarettes, uncomplicated: Secondary | ICD-10-CM | POA: Insufficient documentation

## 2016-04-15 DIAGNOSIS — F410 Panic disorder [episodic paroxysmal anxiety] without agoraphobia: Secondary | ICD-10-CM | POA: Diagnosis present

## 2016-04-15 MED ORDER — METHIMAZOLE 10 MG PO TABS
10.0000 mg | ORAL_TABLET | Freq: Once | ORAL | Status: AC
  Administered 2016-04-15: 10 mg via ORAL
  Filled 2016-04-15: qty 1

## 2016-04-15 MED ORDER — PROPRANOLOL HCL 10 MG PO TABS
10.0000 mg | ORAL_TABLET | Freq: Once | ORAL | Status: DC
  Filled 2016-04-15: qty 1
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
## 2016-04-15 NOTE — ED Notes (Signed)
Patient refused urine test also.  Talking on phone but acting very paranoid and suspicious.
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
## 2016-04-15 NOTE — Discharge Instructions (Signed)
Your symptoms today may be due to your hyperthyroidism. You have refused the necessary lab work and continued evaluation. You are leaving the emergency department [place]. Should you change your mind about receiving evaluation, you are always welcome and encouraged to return to the emergency department.  It is also very important that you take your medication and follow-up with your endocrinologist.
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
## 2016-04-15 NOTE — ED Notes (Signed)
Patient given a meal tray but not eating or drinking anything.  Earlier she was insistent about having something to drink.

## 2016-04-15 NOTE — ED Notes (Signed)
Patient refused lab work and to take Inderal throughout the day. She is leaving AMA and is aware of the risk and benefits. Encouraged her to follow up with her endocrinologist asap. She verbalized understanding. Used phone to contact family member for transportation and will wait in lobby until they arrive.

## 2016-04-15 NOTE — ED Notes (Signed)
Patient refused to have her blood drawn.  Also refused to take the Inderal.

## 2016-07-19 ENCOUNTER — Encounter (HOSPITAL_COMMUNITY): Payer: Self-pay

## 2016-07-19 DIAGNOSIS — Z79899 Other long term (current) drug therapy: Secondary | ICD-10-CM | POA: Diagnosis not present

## 2016-07-19 DIAGNOSIS — F17210 Nicotine dependence, cigarettes, uncomplicated: Secondary | ICD-10-CM | POA: Insufficient documentation

## 2016-07-19 DIAGNOSIS — I5021 Acute systolic (congestive) heart failure: Secondary | ICD-10-CM | POA: Insufficient documentation

## 2016-07-19 DIAGNOSIS — R509 Fever, unspecified: Secondary | ICD-10-CM | POA: Diagnosis not present

## 2016-07-19 DIAGNOSIS — I110 Hypertensive heart disease with heart failure: Secondary | ICD-10-CM | POA: Diagnosis not present

## 2016-07-19 DIAGNOSIS — R002 Palpitations: Secondary | ICD-10-CM | POA: Diagnosis not present

## 2016-07-19 DIAGNOSIS — Z91040 Latex allergy status: Secondary | ICD-10-CM | POA: Diagnosis not present
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
## 2016-07-20 ENCOUNTER — Emergency Department (HOSPITAL_COMMUNITY)
Admission: EM | Admit: 2016-07-20 | Discharge: 2016-07-20 | Discharge status: Against Medical Advice | Payer: Medicaid Other | Attending: Emergency Medicine | Admitting: Emergency Medicine

## 2016-07-20 DIAGNOSIS — R6883 Chills (without fever): Secondary | ICD-10-CM

## 2016-07-20 DIAGNOSIS — R002 Palpitations: Secondary | ICD-10-CM

## 2016-07-20 NOTE — ED Notes (Signed)
Pt refuses to get into gown or have EKG done, pt reports palpations due to anxiety. Pt states that she ned to leave so that she can call a ride, informed pt that if she leaves she will have to check back in, pt is very anxious, pt states that she whishes to leave and will check back in if she needs to.
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
## 2019-03-30 ENCOUNTER — Ambulatory Visit (HOSPITAL_COMMUNITY)
Admission: EM | Admit: 2019-03-30 | Discharge: 2019-03-30 | Disposition: A | Discharge status: Home / Self Care | Payer: Self-pay | Attending: Family Medicine | Admitting: Family Medicine

## 2019-03-30 ENCOUNTER — Encounter (HOSPITAL_COMMUNITY): Payer: Self-pay | Admitting: Family Medicine

## 2019-03-30 ENCOUNTER — Other Ambulatory Visit: Payer: Self-pay

## 2019-03-30 DIAGNOSIS — N946 Dysmenorrhea, unspecified: Secondary | ICD-10-CM

## 2019-03-30 DIAGNOSIS — Z3202 Encounter for pregnancy test, result negative: Secondary | ICD-10-CM

## 2019-03-30 HISTORY — DX: Benign neoplasm of connective and other soft tissue, unspecified: D21.9

## 2019-03-30 LAB — POCT URINALYSIS DIP (DEVICE)
Bilirubin Urine: NEGATIVE
Glucose, UA: NEGATIVE mg/dL
Ketones, ur: NEGATIVE mg/dL
Leukocytes,Ua: NEGATIVE
Nitrite: NEGATIVE
Protein, ur: NEGATIVE mg/dL
Specific Gravity, Urine: 1.02 (ref 1.005–1.030)
Urobilinogen, UA: 0.2 mg/dL (ref 0.0–1.0)
pH: 6 (ref 5.0–8.0)

## 2019-03-30 LAB — POCT PREGNANCY, URINE: Preg Test, Ur: NEGATIVE

## 2019-03-30 LAB — POC URINE PREG, ED: Preg Test, Ur: NEGATIVE

## 2019-03-30 MED ORDER — NAPROXEN 500 MG PO TABS: 500.0000 mg | ORAL_TABLET | Freq: Two times a day (BID) | ORAL | 0 refills | Status: DC

## 2019-03-30 NOTE — ED Triage Notes (Signed)
Pt presents with generalized abdominal pain since waking up this morning. Pt states she also has a discoloration of her urine.

## 2019-03-30 NOTE — ED Provider Notes (Signed)
[name]    CSN: [id number] Arrival date & time: 03/30/19  1103      History   Chief Complaint Chief Complaint  Patient presents with  . Abdominal Pain    HPI Mary Frey is a 43 y.o. female.   Is the initial visit for this 43 year old woman with abdominal pain.  She has a history of PID.  She also has a history of malnutrition, CHF, Graves' disease, cocaine abuse.  Patient states that her menstrual period came on today.  She is having a little bit more pain than usual.  Her doctors prescribed Naprosyn, knowing that she has a small fibroid.  Patient has not taken the Naprosyn stating that it has not helped her in the past.  Patient works for [employer].  Patient's had no cough, loss of sense of smell, diarrhea, vomiting, nausea, myalgia.     Past Medical History:  Diagnosis Date  . Anemia   . Chronic back pain   . Fibroids   . Graves disease   . Hypertension   . Pregnancy induced hypertension     Patient Active Problem List   Diagnosis Date Noted  . Cocaine abuse ([place]) 01/12/2013  . Protein-calorie malnutrition, severe ([place]) 01/12/2013  . Graves disease 01/12/2013  . Acute systolic CHF (congestive heart failure) ([place]) 01/12/2013  . HTN (hypertension) 01/12/2013  . Microcytic anemia 01/12/2013  . Tobacco use disorder 01/12/2013    Past Surgical History:  Procedure Laterality Date  . NO PAST SURGERIES      OB History    Gravida  5   Para  3   Term  3   Preterm      AB  2   Living  3     SAB      TAB  1   Ectopic  1   Multiple      Live Births               Home Medications    Prior to Admission medications   Medication Sig Start Date End Date Taking? Authorizing Provider  hydrochlorothiazide (HYDRODIURIL) 12.5 MG tablet Take 12.5 mg by mouth daily.   Yes [provider]  acetaminophen (TYLENOL) 500 MG tablet Take 1,500 mg by mouth every 6 (six) hours as needed for moderate pain.    [provider]  Cyanocobalamin (B-12 PO) Take 1 tablet by mouth daily.    [provider]  methimazole (TAPAZOLE) 10 MG tablet Take 10 mg in the morning and 5 mg in the evening. 06/28/15   Antonietta Breach, PA-C  naproxen (NAPROSYN) 500 MG tablet Take 1 tablet (500 mg total) by mouth 2 (two) times daily. 03/30/19   Robyn Haber, MD  norethindrone (MICRONOR,CAMILA,ERRIN) 0.35 MG tablet Take 1 tablet (0.35 mg total) by mouth daily. Patient not taking: Reported on 06/27/2015 03/30/14 03/30/19  Lahoma Crocker, MD  propranolol (INDERAL) 20 MG tablet Take 1 tablet (20 mg total) by mouth 2 (two) times daily. Patient not taking: Reported on 06/27/2015 08/12/13 03/30/19  Philemon Kingdom, MD    Family History Family History  Problem Relation Age of Onset  . Diabetes Maternal Aunt   . Diabetes Maternal Uncle   . Diabetes Maternal Grandmother   . Diabetes Maternal Grandfather   . Stroke Paternal Grandfather     Social History Social History   Tobacco Use  . Smoking status: Light Tobacco Smoker    Years: 14.00    Types: Cigarettes  .  Smokeless tobacco: Never Used  Substance Use Topics  . Alcohol use: Yes    Alcohol/week: 0.0 standard drinks    Comment: on occasion  . Drug use: No    Types: Cocaine     Allergies   Hydrocodone and Latex   Review of Systems Review of Systems  Constitutional: Negative.   Gastrointestinal: Positive for abdominal pain.  Genitourinary: Positive for menstrual problem.  All other systems reviewed and are negative.    Physical Exam Triage Vital Signs ED Triage Vitals  Enc Vitals Group     BP      Pulse      Resp      Temp      Temp src      SpO2      Weight      Height      Head Circumference      Peak Flow      Pain Score      Pain Loc      Pain Edu?      Excl. in [place]?    No data found.  Updated Vital Signs BP (!) 156/105 (BP Location: Right Arm)   Pulse 89   Temp 98.7 F (37.1 C) (Oral)   Resp 18   LMP 02/23/2019   SpO2  100%    Physical Exam Vitals signs and nursing note reviewed.  Constitutional:      General: She is not in acute distress.    Appearance: She is well-developed and normal weight. She is not ill-appearing.  HENT:     Head: Normocephalic.  Pulmonary:     Effort: Pulmonary effort is normal.  Abdominal:     General: Abdomen is flat. Bowel sounds are normal. There are no signs of injury.     Palpations: Abdomen is soft.     Tenderness: There is abdominal tenderness in the suprapubic area.  Skin:    General: Skin is warm and dry.  Neurological:     General: No focal deficit present.     Mental Status: She is alert.  Psychiatric:        Mood and Affect: Mood normal.        Behavior: Behavior normal.      UC Treatments / Results  Labs (all labs ordered are listed, but only abnormal results are displayed) Labs Reviewed  POCT URINALYSIS DIP (DEVICE) - Abnormal; Notable for the following components:      Result Value   Hgb urine dipstick MODERATE (*)    All other components within normal limits  POC URINE PREG, ED  POCT PREGNANCY, URINE    EKG   Radiology No results found.  Procedures Procedures (including critical care time)  Medications Ordered in UC Medications - No data to display  Initial Impression / Assessment and Plan / UC Course  I have reviewed the triage vital signs and the nursing notes.  Pertinent labs & imaging results that were available during my care of the patient were reviewed by me and considered in my medical decision making (see chart for details).    Final Clinical Impressions(s) / UC Diagnoses   Final diagnoses:  Dysmenorrhea   Discharge Instructions   None    ED Prescriptions    Medication Sig Dispense Auth. Provider   naproxen (NAPROSYN) 500 MG tablet Take 1 tablet (500 mg total) by mouth 2 (two) times daily. 30 tablet Robyn Haber, MD     I have reviewed the PDMP during this encounter.  Robyn Haber, MD 03/30/19 1225

## 2019-06-21 ENCOUNTER — Encounter (HOSPITAL_COMMUNITY): Payer: Self-pay | Admitting: Emergency Medicine

## 2019-06-21 ENCOUNTER — Emergency Department (HOSPITAL_COMMUNITY)
Admission: EM | Admit: 2019-06-21 | Discharge: 2019-06-22 | Disposition: A | Discharge status: Home / Self Care | Payer: Medicaid - Out of State | Attending: Emergency Medicine | Admitting: Emergency Medicine

## 2019-06-21 ENCOUNTER — Other Ambulatory Visit: Payer: Self-pay

## 2019-06-21 DIAGNOSIS — D696 Thrombocytopenia, unspecified: Secondary | ICD-10-CM | POA: Insufficient documentation

## 2019-06-21 DIAGNOSIS — Z79899 Other long term (current) drug therapy: Secondary | ICD-10-CM | POA: Insufficient documentation

## 2019-06-21 DIAGNOSIS — R079 Chest pain, unspecified: Secondary | ICD-10-CM | POA: Insufficient documentation

## 2019-06-21 DIAGNOSIS — R002 Palpitations: Secondary | ICD-10-CM | POA: Insufficient documentation

## 2019-06-21 DIAGNOSIS — D649 Anemia, unspecified: Secondary | ICD-10-CM | POA: Diagnosis not present

## 2019-06-21 DIAGNOSIS — F17210 Nicotine dependence, cigarettes, uncomplicated: Secondary | ICD-10-CM | POA: Insufficient documentation

## 2019-06-21 DIAGNOSIS — D72819 Decreased white blood cell count, unspecified: Secondary | ICD-10-CM | POA: Diagnosis not present

## 2019-06-21 DIAGNOSIS — I10 Essential (primary) hypertension: Secondary | ICD-10-CM | POA: Insufficient documentation

## 2019-06-21 NOTE — ED Provider Notes (Signed)
[facility] DEPT Provider Note   CSN: [id number] Arrival date & time: 06/21/19  2303     History Chief Complaint  Patient presents with  . Palpitations  . Chest Pain    Mary Frey is a 44 y.o. female.  The history is provided by the patient.  Palpitations Palpitations quality:  Fast Onset quality:  Sudden Timing:  Constant Progression:  Unchanged Chronicity:  New Relieved by:  Nothing Worsened by:  Nothing Associated symptoms: no vomiting   Risk factors: hyperthyroidism   Patient presents for palpitations.  Patient reports she felt that her heart was racing.  She denies chest pain but reports her heart hurts.  She also reports she was recently diagnosed with a murmur No fevers or vomiting.  No shortness of breath.  No syncope. She reports he has a history of hyperthyroidism that is untreated as she did not tolerate the medications. She reports she was recently admitted to the hospital in [place] for 2 weeks for a panic attack.  She reports she was told she has a heart murmur     Past Medical History:  Diagnosis Date  . Anemia   . Chronic back pain   . Fibroids   . Graves disease   . Hypertension   . Pregnancy induced hypertension     Patient Active Problem List   Diagnosis Date Noted  . Cocaine abuse ([place]) 01/12/2013  . Protein-calorie malnutrition, severe ([place]) 01/12/2013  . Graves disease 01/12/2013  . Acute systolic CHF (congestive heart failure) ([place]) 01/12/2013  . HTN (hypertension) 01/12/2013  . Microcytic anemia 01/12/2013  . Tobacco use disorder 01/12/2013    Past Surgical History:  Procedure Laterality Date  . NO PAST SURGERIES       OB History    Gravida  5   Para  3   Term  3   Preterm      AB  2   Living  3     SAB      TAB  1   Ectopic  1   Multiple      Live Births              Family History  Problem Relation Age of Onset  . Diabetes Maternal Aunt   . Diabetes Maternal Uncle    . Diabetes Maternal Grandmother   . Diabetes Maternal Grandfather   . Stroke Paternal Grandfather     Social History   Tobacco Use  . Smoking status: Light Tobacco Smoker    Years: 14.00    Types: Cigarettes  . Smokeless tobacco: Never Used  Substance Use Topics  . Alcohol use: Yes    Alcohol/week: 0.0 standard drinks    Comment: on occasion  . Drug use: No    Types: Cocaine    Home Medications Prior to Admission medications   Medication Sig Start Date End Date Taking? Authorizing Provider  acetaminophen (TYLENOL) 500 MG tablet Take 1,500 mg by mouth every 6 (six) hours as needed for moderate pain.    [provider]  Cyanocobalamin (B-12 PO) Take 1 tablet by mouth daily.    [provider]  hydrochlorothiazide (HYDRODIURIL) 12.5 MG tablet Take 12.5 mg by mouth daily.    [provider]  methimazole (TAPAZOLE) 10 MG tablet Take 10 mg in the morning and 5 mg in the evening. 06/28/15   Antonietta Breach, PA-C  naproxen (NAPROSYN) 500 MG tablet Take 1 tablet (500 mg total) by mouth 2 (  two) times daily. 03/30/19   Robyn Haber, MD  norethindrone (MICRONOR,CAMILA,ERRIN) 0.35 MG tablet Take 1 tablet (0.35 mg total) by mouth daily. Patient not taking: Reported on 06/27/2015 03/30/14 03/30/19  Lahoma Crocker, MD  propranolol (INDERAL) 20 MG tablet Take 1 tablet (20 mg total) by mouth 2 (two) times daily. Patient not taking: Reported on 06/27/2015 08/12/13 03/30/19  Philemon Kingdom, MD    Allergies    Hydrocodone and Latex  Review of Systems   Review of Systems  Cardiovascular: Positive for palpitations.  Gastrointestinal: Negative for vomiting.  Neurological: Negative for syncope.  All other systems reviewed and are negative.   Physical Exam Updated Vital Signs BP (!) 137/105 (BP Location: Left Arm)   Pulse 73   Temp 97.9 F (36.6 C) (Oral)   Resp (!) 21   Ht 1.676 m (5\' 6" )   Wt 53.5 kg   LMP 06/21/2019 (Exact Date)   SpO2 100%   BMI 19.05  kg/m   Physical Exam CONSTITUTIONAL: Well developed/well nourished HEAD: Normocephalic/atraumatic EYES: EOMI/PERRL ENMT: Mucous membranes moist NECK: supple no meningeal signs, no thyromegaly SPINE/BACK:entire spine nontender CV: S1/S2 noted, no murmurs/rubs/gallops noted LUNGS: Lungs are clear to auscultation bilaterally, no apparent distress ABDOMEN: soft, nontender, no rebound or guarding, bowel sounds noted throughout abdomen GU:no cva tenderness NEURO: Pt is awake/alert/appropriate, moves all extremitiesx4.  No facial droop.   EXTREMITIES: pulses normal/equal, full ROM, no calf tenderness SKIN: warm, color normal PSYCH: no abnormalities of mood noted, alert and oriented to situation  ED Results / Procedures / Treatments   Labs (all labs ordered are listed, but only abnormal results are displayed) Labs Reviewed  BASIC METABOLIC PANEL - Abnormal; Notable for the following components:      Result Value   Calcium 8.6 (*)    All other components within normal limits  CBC WITH DIFFERENTIAL/PLATELET - Abnormal; Notable for the following components:   WBC 1.5 (*)    RBC 2.91 (*)    Hemoglobin 10.1 (*)    HCT 28.0 (*)    MCH 34.7 (*)    MCHC 36.1 (*)    Platelets 87 (*)    nRBC 4.1 (*)    Neutro Abs 0.5 (*)    Monocytes Absolute 0.0 (*)    All other components within normal limits  CBC WITH DIFFERENTIAL/PLATELET - Abnormal; Notable for the following components:   WBC 1.2 (*)    RBC 2.51 (*)    Hemoglobin 8.5 (*)    HCT 24.0 (*)    Platelets 82 (*)    nRBC 3.5 (*)    Neutro Abs 0.4 (*)    All other components within normal limits  HEPATIC FUNCTION PANEL - Abnormal; Notable for the following components:   Total Protein 8.4 (*)    AST 52 (*)    Total Bilirubin 1.3 (*)    Bilirubin, Direct 0.3 (*)    Indirect Bilirubin 1.0 (*)    All other components within normal limits  TSH  PROTIME-INR  APTT  T4, FREE  T3, FREE  TROPONIN I (HIGH SENSITIVITY)    EKG EKG  Interpretation  Date/Time:  Tuesday June 22 2019 00:13:08 EST Ventricular Rate:  75 PR Interval:    QRS Duration: 84 QT Interval:  405 QTC Calculation: 453 R Axis:   -24 Text Interpretation: Sinus rhythm Borderline left axis deviation ST elev, probable normal early repol pattern Confirmed by Ripley Fraise [id number] on 06/22/2019 12:28:21 AM   Radiology No results found.  Procedures  Procedures   Medications Ordered in ED Medications - No data to display  ED Course  I have reviewed the triage vital signs and the nursing notes.  Pertinent labs results that were available during my care of the patient were reviewed by me and considered in my medical decision making (see chart for details).    MDM Rules/Calculators/A&P                      11:48 PM Patient presents for palpitations.  She is reports she has had this recently.  She confirms she has not been taking medicines for hyperthyroid Currently heart rate is within normal limits sinus rhythm.  No signs of thyroid storm. Will check labs.  She reports she was recently admitted for 2 weeks for panic attack but is unable to provide any further details She is requesting referral to cardiology and endocrinology.  She denies chest pain, but reports her heart hurts. Labs are pending this time 1:21 AM Patient stable, no acute distress.  No acute EKG findings.  Telemetry monitoring has been unremarkable CBC is abnormal when compared to prior.  We will recheck this in the ER.  We will also call for old records from outside hospital 3:02 AM Records obtained from [facility]. Records reveal on February 14 patient was admitted for elevated troponin likely due to hypertensive emergency, acute psychosis with hallucinations, was noted to have mild leukopenia, with Olguin count of 1.4, hemoglobin 11.2, platelet count of 195 It is noted that she was found to have a heart murmur, I recommend echocardiogram but patient refused .  She was  transferred to a psych facility after that admission 4:02 AM WBC/PLT/HGB appear worsened Pt reports recent heavy menstrual cycle and recent gingival bleeding She is in otherwise no acute distress Patient reports she is on Risperdal, Vistaril and another medication she is unable to recall. Risperdal could cause these issues. We will add on further labs, then consult hematology 5:04 AM D/w Dr. Jana Hakim We reviewed labs. No acute intervention at this time, but he will have his office call patient later today for a follow-up  6:11 AM Patient resting comfortably most of the night.  Vitals remained stable. BP 117/81   Pulse 91   Temp 97.9 F (36.6 C) (Oral)   Resp 18   Ht 1.676 m (5\' 6" )   Wt 53.5 kg   LMP 06/21/2019 (Exact Date)   SpO2 100%   BMI 19.05 kg/m  I discussed at length need for close follow-up for her new findings on CBC.  At this point she is afebrile in no acute distress.  She does not require any acute blood transfusion at this time.  Unclear cause of pancytopenia. I have updated the phone numbers to contact patient below [phone] - sister [phone] - patient  She understands she should receive a phone call today from the hematology clinic for follow-up. She can also follow-up as an outpatient with endocrinology and cardiology. No other acute issues at this time. Final Clinical Impression(s) / ED Diagnoses Final diagnoses:  Palpitations  Leukopenia, unspecified type  Thrombocytopenia (HCC)  Anemia, unspecified type    Rx / DC Orders ED Discharge Orders    None       Ripley Fraise, MD 06/22/19 [phone]

## 2019-06-21 NOTE — ED Triage Notes (Signed)
Pt arrived via [facility] from home with complaints of racing heart and chest pain that started this evening. Pt stated she has a murmur that was found via ECG in the past.   BP 140/93 HR 84 RR 16 Temp 97.9 oral  SpO2 100

## 2019-06-22 ENCOUNTER — Inpatient Hospital Stay: Payer: Medicaid - Out of State

## 2019-06-22 ENCOUNTER — Encounter: Payer: Self-pay | Admitting: Adult Health

## 2019-06-22 ENCOUNTER — Telehealth: Payer: Self-pay | Admitting: Oncology

## 2019-06-22 ENCOUNTER — Encounter: Payer: Self-pay | Admitting: Oncology

## 2019-06-22 ENCOUNTER — Inpatient Hospital Stay: Payer: Medicaid - Out of State | Attending: Adult Health | Admitting: Adult Health

## 2019-06-22 ENCOUNTER — Other Ambulatory Visit: Payer: Self-pay | Admitting: Oncology

## 2019-06-22 VITALS — BP 116/85 | HR 83 | Temp 98.0°F | Resp 18 | Ht 66.0 in | Wt 128.6 lb

## 2019-06-22 DIAGNOSIS — Z885 Allergy status to narcotic agent status: Secondary | ICD-10-CM | POA: Diagnosis not present

## 2019-06-22 DIAGNOSIS — D61818 Other pancytopenia: Secondary | ICD-10-CM

## 2019-06-22 DIAGNOSIS — D709 Neutropenia, unspecified: Secondary | ICD-10-CM | POA: Insufficient documentation

## 2019-06-22 DIAGNOSIS — Z833 Family history of diabetes mellitus: Secondary | ICD-10-CM | POA: Diagnosis not present

## 2019-06-22 DIAGNOSIS — F17210 Nicotine dependence, cigarettes, uncomplicated: Secondary | ICD-10-CM | POA: Diagnosis not present

## 2019-06-22 DIAGNOSIS — E0500 Thyrotoxicosis with diffuse goiter without thyrotoxic crisis or storm: Secondary | ICD-10-CM

## 2019-06-22 DIAGNOSIS — Z79899 Other long term (current) drug therapy: Secondary | ICD-10-CM | POA: Diagnosis not present

## 2019-06-22 DIAGNOSIS — R42 Dizziness and giddiness: Secondary | ICD-10-CM | POA: Insufficient documentation

## 2019-06-22 DIAGNOSIS — Z823 Family history of stroke: Secondary | ICD-10-CM | POA: Insufficient documentation

## 2019-06-22 DIAGNOSIS — D649 Anemia, unspecified: Secondary | ICD-10-CM | POA: Diagnosis not present

## 2019-06-22 DIAGNOSIS — E538 Deficiency of other specified B group vitamins: Secondary | ICD-10-CM | POA: Insufficient documentation

## 2019-06-22 DIAGNOSIS — D696 Thrombocytopenia, unspecified: Secondary | ICD-10-CM | POA: Diagnosis not present

## 2019-06-22 LAB — CBC WITH DIFFERENTIAL/PLATELET
Abs Immature Granulocytes: 0 10*3/uL (ref 0.00–0.07)
Abs Immature Granulocytes: 0 10*3/uL (ref 0.00–0.07)
Abs Immature Granulocytes: 0.01 10*3/uL (ref 0.00–0.07)
Basophils Absolute: 0 10*3/uL (ref 0.0–0.1)
Basophils Absolute: 0 10*3/uL (ref 0.0–0.1)
Basophils Absolute: 0 10*3/uL (ref 0.0–0.1)
Basophils Relative: 0 %
Basophils Relative: 0 %
Basophils Relative: 0 %
Eosinophils Absolute: 0 10*3/uL (ref 0.0–0.5)
Eosinophils Absolute: 0 10*3/uL (ref 0.0–0.5)
Eosinophils Absolute: 0 10*3/uL (ref 0.0–0.5)
Eosinophils Relative: 2 %
Eosinophils Relative: 2 %
Eosinophils Relative: 3 %
HCT: 24 % — ABNORMAL LOW (ref 36.0–46.0)
HCT: 26.8 % — ABNORMAL LOW (ref 36.0–46.0)
HCT: 28 % — ABNORMAL LOW (ref 36.0–46.0)
Hemoglobin: 10.1 g/dL — ABNORMAL LOW (ref 12.0–15.0)
Hemoglobin: 8.5 g/dL — ABNORMAL LOW (ref 12.0–15.0)
Hemoglobin: 9.6 g/dL — ABNORMAL LOW (ref 12.0–15.0)
Immature Granulocytes: 0 %
Immature Granulocytes: 0 %
Immature Granulocytes: 1 %
Lymphocytes Relative: 48 %
Lymphocytes Relative: 58 %
Lymphocytes Relative: 60 %
Lymphs Abs: 0.7 10*3/uL (ref 0.7–4.0)
Lymphs Abs: 0.7 10*3/uL (ref 0.7–4.0)
Lymphs Abs: 0.9 10*3/uL (ref 0.7–4.0)
MCH: 33.9 pg (ref 26.0–34.0)
MCH: 34.2 pg — ABNORMAL HIGH (ref 26.0–34.0)
MCH: 34.7 pg — ABNORMAL HIGH (ref 26.0–34.0)
MCHC: 35.4 g/dL (ref 30.0–36.0)
MCHC: 35.8 g/dL (ref 30.0–36.0)
MCHC: 36.1 g/dL — ABNORMAL HIGH (ref 30.0–36.0)
MCV: 95.4 fL (ref 80.0–100.0)
MCV: 95.6 fL (ref 80.0–100.0)
MCV: 96.2 fL (ref 80.0–100.0)
Monocytes Absolute: 0 10*3/uL — ABNORMAL LOW (ref 0.1–1.0)
Monocytes Absolute: 0.1 10*3/uL (ref 0.1–1.0)
Monocytes Absolute: 0.1 10*3/uL (ref 0.1–1.0)
Monocytes Relative: 2 %
Monocytes Relative: 4 %
Monocytes Relative: 4 %
Neutro Abs: 0.4 10*3/uL — ABNORMAL LOW (ref 1.7–7.7)
Neutro Abs: 0.5 10*3/uL — ABNORMAL LOW (ref 1.7–7.7)
Neutro Abs: 0.7 10*3/uL — ABNORMAL LOW (ref 1.7–7.7)
Neutrophils Relative %: 34 %
Neutrophils Relative %: 36 %
Neutrophils Relative %: 46 %
Platelets: 82 10*3/uL — ABNORMAL LOW (ref 150–400)
Platelets: 87 10*3/uL — ABNORMAL LOW (ref 150–400)
Platelets: 92 10*3/uL — ABNORMAL LOW (ref 150–400)
RBC: 2.51 MIL/uL — ABNORMAL LOW (ref 3.87–5.11)
RBC: 2.81 MIL/uL — ABNORMAL LOW (ref 3.87–5.11)
RBC: 2.91 MIL/uL — ABNORMAL LOW (ref 3.87–5.11)
RDW: 14.9 % (ref 11.5–15.5)
RDW: 14.9 % (ref 11.5–15.5)
RDW: 15.2 % (ref 11.5–15.5)
WBC: 1.2 10*3/uL — CL (ref 4.0–10.5)
WBC: 1.4 10*3/uL — ABNORMAL LOW (ref 4.0–10.5)
WBC: 1.5 10*3/uL — ABNORMAL LOW (ref 4.0–10.5)
nRBC: 2.8 % — ABNORMAL HIGH (ref 0.0–0.2)
nRBC: 3.5 % — ABNORMAL HIGH (ref 0.0–0.2)
nRBC: 4.1 % — ABNORMAL HIGH (ref 0.0–0.2)

## 2019-06-22 LAB — IRON AND TIBC
Iron: 108 ug/dL (ref 41–142)
Saturation Ratios: 41 % (ref 21–57)
TIBC: 266 ug/dL (ref 236–444)
UIBC: 158 ug/dL (ref 120–384)

## 2019-06-22 LAB — COMPREHENSIVE METABOLIC PANEL
ALT: 34 U/L (ref 0–44)
AST: 50 U/L — ABNORMAL HIGH (ref 15–41)
Albumin: 3.7 g/dL (ref 3.5–5.0)
Alkaline Phosphatase: 88 U/L (ref 38–126)
Anion gap: 7 (ref 5–15)
BUN: 14 mg/dL (ref 6–20)
CO2: 29 mmol/L (ref 22–32)
Calcium: 8.6 mg/dL — ABNORMAL LOW (ref 8.9–10.3)
Chloride: 104 mmol/L (ref 98–111)
Creatinine, Ser: 0.73 mg/dL (ref 0.44–1.00)
GFR calc Af Amer: >60 mL/min (ref >60)
GFR calc non Af Amer: >60 mL/min (ref >60)
Glucose, Bld: 101 mg/dL — ABNORMAL HIGH (ref 70–99)
Potassium: 3.5 mmol/L (ref 3.5–5.1)
Sodium: 140 mmol/L (ref 135–145)
Total Bilirubin: 0.9 mg/dL (ref 0.3–1.2)
Total Protein: 8.2 g/dL — ABNORMAL HIGH (ref 6.5–8.1)

## 2019-06-22 LAB — BASIC METABOLIC PANEL
Anion gap: 7 (ref 5–15)
BUN: 12 mg/dL (ref 6–20)
CO2: 28 mmol/L (ref 22–32)
Calcium: 8.6 mg/dL — ABNORMAL LOW (ref 8.9–10.3)
Chloride: 106 mmol/L (ref 98–111)
Creatinine, Ser: 0.56 mg/dL (ref 0.44–1.00)
GFR calc Af Amer: >60 mL/min (ref >60)
GFR calc non Af Amer: >60 mL/min (ref >60)
Glucose, Bld: 86 mg/dL (ref 70–99)
Potassium: 3.5 mmol/L (ref 3.5–5.1)
Sodium: 141 mmol/L (ref 135–145)

## 2019-06-22 LAB — PATHOLOGIST SMEAR REVIEW

## 2019-06-22 LAB — RETICULOCYTES
Immature Retic Fract: 17.2 % — ABNORMAL HIGH (ref 2.3–15.9)
RBC.: 2.85 MIL/uL — ABNORMAL LOW (ref 3.87–5.11)
Retic Count, Absolute: 23.9 10*3/uL (ref 19.0–186.0)
Retic Ct Pct: 0.8 % (ref 0.4–3.1)

## 2019-06-22 LAB — HEPATIC FUNCTION PANEL
ALT: 36 U/L (ref 0–44)
AST: 52 U/L — ABNORMAL HIGH (ref 15–41)
Albumin: 3.9 g/dL (ref 3.5–5.0)
Alkaline Phosphatase: 80 U/L (ref 38–126)
Bilirubin, Direct: 0.3 mg/dL — ABNORMAL HIGH (ref 0.0–0.2)
Indirect Bilirubin: 1 mg/dL — ABNORMAL HIGH (ref 0.3–0.9)
Total Bilirubin: 1.3 mg/dL — ABNORMAL HIGH (ref 0.3–1.2)
Total Protein: 8.4 g/dL — ABNORMAL HIGH (ref 6.5–8.1)

## 2019-06-22 LAB — SEDIMENTATION RATE: Sed Rate: 56 mm/hr — ABNORMAL HIGH (ref 0–22)

## 2019-06-22 LAB — PROTIME-INR
INR: 1 (ref 0.8–1.2)
Prothrombin Time: 13 seconds (ref 11.4–15.2)

## 2019-06-22 LAB — FERRITIN: Ferritin: 91 ng/mL (ref 11–307)

## 2019-06-22 LAB — SAVE SMEAR(SSMR), FOR PROVIDER SLIDE REVIEW

## 2019-06-22 LAB — APTT: aPTT: 30 seconds (ref 24–36)

## 2019-06-22 LAB — FOLATE: Folate: 37 ng/mL (ref >5.9)

## 2019-06-22 LAB — TSH: TSH: 1.197 u[IU]/mL (ref 0.350–4.500)

## 2019-06-22 LAB — T4, FREE: Free T4: 0.79 ng/dL (ref 0.61–1.12)

## 2019-06-22 LAB — VITAMIN B12: Vitamin B-12: 53 pg/mL — ABNORMAL LOW (ref 180–914)

## 2019-06-22 LAB — TROPONIN I (HIGH SENSITIVITY): Troponin I (High Sensitivity): <2 ng/L (ref <18)

## 2019-06-22 NOTE — Progress Notes (Addendum)
[facility]  [phone] [phone]     ID: Mary Frey DOB: 04/12/1976  MR#: [id number]  WGY#:[id number]  Patient Care Team: System, Provider Not In as PCP - [place], NP OTHER MD:  CHIEF COMPLAINT: pancytopenia  CURRENT TREATMENT: oral iron   HISTORY OF CURRENT ILLNESS:  Mary Frey is here for consultation at the request of the emergency room, after being evaluated last night for dizziness, and was found to be mildly neutropenic, anemic, and mildly thrombocytopenic.  She tells me that she has been anemic as long as she can remember, and struggles to take oral iron because it is constipating.  When She was taking oral iron, she notes she didn't feel like this.    Her WBC since 2017:   Ref. Range 06/27/2015 19:07 06/22/2019 00:12 06/22/2019 01:29  WBC Latest Ref Range: 4.0 - 10.5 K/uL 5.4 1.5 (L) 1.2 (LL)   Her hemoglobin since 2013:   Ref. Range 06/24/2011 20:42 01/12/2013 02:46 01/13/2013 04:40 03/15/2015 17:53 06/27/2015 19:07 06/22/2019 00:12 06/22/2019 01:29  Hemoglobin Latest Ref Range: 12.0 - 15.0 g/dL 9.8 (L) 10.4 (L) 10.0 (L) 7.6 (L) 7.2 (L) 10.1 (L) 8.5 (L)   The only iron tests we have on record are from 12/2012 and show an iron level of 99, a ferritin of 42, and a TIBC of 243  Her platelets since 2017:   Ref. Range 06/27/2015 19:07 06/22/2019 00:12 06/22/2019 01:29  Platelets Latest Ref Range: 150 - 400 K/uL 329 87 (L) 82 (L)     The patient's subsequent history is as detailed below.  INTERVAL HISTORY: Mary Frey notes that she sought care in the emergency room yesterday evening due to feeling faint and dizzy since starting her menstrual cycle yesterday.  She notes that she has very heavy menstrual cycles.  She was concerned about her heart hurting due to her h/o hyperthyroidism, and not being able to tolerate the medication for that.    Mary Frey notes that she has regular menstrual cycles every 28 days that are heavy the first 4  days.  They start out on the first two days with a  6-8 pad count, and then 3 pad/count day the following two days.  She denies any bleeding outside of the time that she is having her cycle.  She notes that she does feel dizzy during her cycle, and was taking iron, however stopped due to constipation.    REVIEW OF SYSTEMS:  Mary Frey denies any fever, chills, chest pain, cough, bowel/bladder changes, headaches, vision issues, nausea, vomiting, or any other concerns today.  She declined to discuss her personal life, and asked that I review it in the computer.    PAST MEDICAL HISTORY: Past Medical History:  Diagnosis Date  . Anemia   . Chronic back pain   . Fibroids   . Graves disease   . Hypertension   . Pregnancy induced hypertension     PAST SURGICAL HISTORY: Past Surgical History:  Procedure Laterality Date  . NO PAST SURGERIES      FAMILY HISTORY Family History  Problem Relation Age of Onset  . Diabetes Maternal Aunt   . Diabetes Maternal Uncle   . Diabetes Maternal Grandmother   . Diabetes Maternal Grandfather   . Stroke Paternal Grandfather     GYNECOLOGIC HISTORY:  Patient's last menstrual period was 06/21/2019 (exact date). Menarche: 44 years old [place] P 3 LMP 06/21/2019 Contraceptive has been on depo provera and OCP in the past in her  20s  HRT none  Hysterectomy? no Salpingo-oophorectomy?no    SOCIAL HISTORY:  [place] notes that she goes back and forth between her home in [place], and a place in [place].  She declined to give any further information about her social history, and asked that I refer back to what is loaded in Epic.      ADVANCED DIRECTIVES:    HEALTH MAINTENANCE: Social History   Tobacco Use  . Smoking status: Light Tobacco Smoker    Years: 14.00    Types: Cigarettes  . Smokeless tobacco: Never Used  Substance Use Topics  . Alcohol use: Yes    Alcohol/week: 0.0 standard drinks    Comment: on occasion  . Drug use: No    Types:  Cocaine     Colonoscopy:  PAP:  Bone density:   Allergies  Allergen Reactions  . Hydrocodone Nausea And Vomiting  . Latex Itching and Rash    Current Outpatient Medications  Medication Sig Dispense Refill  . acetaminophen (TYLENOL) 500 MG tablet Take 1,500 mg by mouth every 6 (six) hours as needed for moderate pain.    Marland Kitchen aspirin EC 81 MG tablet Take 81 mg by mouth daily.    . ferrous sulfate 325 (65 FE) MG EC tablet Take 325 mg by mouth daily with breakfast.    . hydrochlorothiazide (HYDRODIURIL) 12.5 MG tablet Take 12.5 mg by mouth daily.    . hydrOXYzine (ATARAX/VISTARIL) 25 MG tablet Take 25 mg by mouth every 6 (six) hours as needed.    . risperiDONE (RISPERDAL) 3 MG tablet Take 3 mg by mouth at bedtime.    . traZODone (DESYREL) 50 MG tablet Take 50 mg by mouth at bedtime.    . methimazole (TAPAZOLE) 10 MG tablet Take 10 mg in the morning and 5 mg in the evening. (Patient not taking: Reported on 06/21/2019) 45 tablet 0   No current facility-administered medications for this visit.    OBJECTIVE:  Vitals:   06/22/19 0925  BP: 116/85  Pulse: 83  Resp: 18  Temp: 98 F (36.7 C)  SpO2: 100%     Body mass index is 20.76 kg/m.   Wt Readings from Last 3 Encounters:  06/22/19 128 lb 9.6 oz (58.3 kg)  06/21/19 118 lb (53.5 kg)  01/17/16 118 lb (53.5 kg)      ECOG FS:1 - Symptomatic but completely ambulatory Patient declined a physical exam today She is in no apparent distress, her breathing is non labored, her mood and affect are normal, her speech is normal, skin visualized without rash or lesion.     LAB RESULTS:  CMP     Component Value Date/Time   NA 141 06/22/2019 0012   K 3.5 06/22/2019 0012   CL 106 06/22/2019 0012   CO2 28 06/22/2019 0012   GLUCOSE 86 06/22/2019 0012   BUN 12 06/22/2019 0012   CREATININE 0.56 06/22/2019 0012   CALCIUM 8.6 (L) 06/22/2019 0012   PROT 8.4 (H) 06/22/2019 0402   ALBUMIN 3.9 06/22/2019 0402   AST 52 (H) 06/22/2019 0402    ALT 36 06/22/2019 0402   ALKPHOS 80 06/22/2019 0402   BILITOT 1.3 (H) 06/22/2019 0402   GFRNONAA >60 06/22/2019 0012   GFRAA >60 06/22/2019 0012    No results found for: TOTALPROTELP, ALBUMINELP, A1GS, A2GS, BETS, BETA2SER, GAMS, MSPIKE, SPEI  No results found for: KPAFRELGTCHN, LAMBDASER, KAPLAMBRATIO  Lab Results  Component Value Date   WBC 1.2 (LL) 06/22/2019   NEUTROABS 0.4 (L)  06/22/2019   HGB 8.5 (L) 06/22/2019   HCT 24.0 (L) 06/22/2019   MCV 95.6 06/22/2019   PLT 82 (L) 06/22/2019      Chemistry      Component Value Date/Time   NA 141 06/22/2019 0012   K 3.5 06/22/2019 0012   CL 106 06/22/2019 0012   CO2 28 06/22/2019 0012   BUN 12 06/22/2019 0012   CREATININE 0.56 06/22/2019 0012      Component Value Date/Time   CALCIUM 8.6 (L) 06/22/2019 0012   ALKPHOS 80 06/22/2019 0402   AST 52 (H) 06/22/2019 0402   ALT 36 06/22/2019 0402   BILITOT 1.3 (H) 06/22/2019 0402       No results found for: LABCA2  No components found for: [id number]  Recent Labs  Lab 06/22/19 0402  INR 1.0    No results found for: LABCA2  No results found for: IDP824  No results found for: MPN361  No results found for: WER154  No results found for: CA2729  No components found for: HGQUANT  No results found for: CEA1 / No results found for: CEA1   No results found for: AFPTUMOR  No results found for: CHROMOGRNA  No results found for: PSA1  Admission on 06/21/2019, Discharged on 06/22/2019  Component Date Value Ref Range Status  . Sodium 06/22/2019 141  135 - 145 mmol/L Final  . Potassium 06/22/2019 3.5  3.5 - 5.1 mmol/L Final  . Chloride 06/22/2019 106  98 - 111 mmol/L Final  . CO2 06/22/2019 28  22 - 32 mmol/L Final  . Glucose, Bld 06/22/2019 86  70 - 99 mg/dL Final   Glucose reference range applies only to samples taken after fasting for at least 8 hours.  . BUN 06/22/2019 12  6 - 20 mg/dL Final  . Creatinine, Ser 06/22/2019 0.56  0.44 - 1.00 mg/dL Final  . Calcium  06/22/2019 8.6* 8.9 - 10.3 mg/dL Final  . GFR calc non Af Amer 06/22/2019 >60  >60 mL/min Final  . GFR calc Af Amer 06/22/2019 >60  >60 mL/min Final  . Anion gap 06/22/2019 7  5 - 15 Final   Performed at [facility], [place] [address], [place], [place] [address]  . WBC 06/22/2019 1.5* 4.0 - 10.5 K/uL Final  . RBC 06/22/2019 2.91* 3.87 - 5.11 MIL/uL Final  . Hemoglobin 06/22/2019 10.1* 12.0 - 15.0 g/dL Final  . HCT 06/22/2019 28.0* 36.0 - 46.0 % Final  . MCV 06/22/2019 96.2  80.0 - 100.0 fL Final  . MCH 06/22/2019 34.7* 26.0 - 34.0 pg Final  . MCHC 06/22/2019 36.1* 30.0 - 36.0 g/dL Final  . RDW 06/22/2019 14.9  11.5 - 15.5 % Final  . Platelets 06/22/2019 87* 150 - 400 K/uL Final   Comment: REPEATED TO VERIFY PLATELET COUNT CONFIRMED BY SMEAR SPECIMEN CHECKED FOR CLOTS Immature Platelet Fraction may be clinically indicated, consider ordering this additional test [id number]   . nRBC 06/22/2019 4.1* 0.0 - 0.2 % Final  . Neutrophils Relative % 06/22/2019 36  % Final  . Neutro Abs 06/22/2019 0.5* 1.7 - 7.7 K/uL Final  . Lymphocytes Relative 06/22/2019 60  % Final  . Lymphs Abs 06/22/2019 0.9  0.7 - 4.0 K/uL Final  . Monocytes Relative 06/22/2019 2  % Final  . Monocytes Absolute 06/22/2019 0.0* 0.1 - 1.0 K/uL Final  . Eosinophils Relative 06/22/2019 2  % Final  . Eosinophils Absolute 06/22/2019 0.0  0.0 - 0.5 K/uL Final  . Basophils  Relative 06/22/2019 0  % Final  . Basophils Absolute 06/22/2019 0.0  0.0 - 0.1 K/uL Final  . Immature Granulocytes 06/22/2019 0  % Final  . Abs Immature Granulocytes 06/22/2019 0.00  0.00 - 0.07 K/uL Final  . Polychromasia 06/22/2019 PRESENT   Final  . Target Cells 06/22/2019 PRESENT   Final   Performed at [facility], [place] [address], [place], [place] [address]  . Troponin I (High Sensitivity) 06/22/2019 <2  <18 ng/L Final   Performed at [facility] [address] [place], [place] [address]  . TSH  06/22/2019 1.197  0.350 - 4.500 uIU/mL Final   Comment: Performed by a 3rd Generation assay with a functional sensitivity of <=0.01 uIU/mL. Performed at [facility], [place] [address], [place], [place] [address]   . Free T4 06/22/2019 0.79  0.61 - 1.12 ng/dL Final   Comment: (NOTE) Biotin ingestion may interfere with free T4 tests. If the results are inconsistent with the TSH level, previous test results, or the clinical presentation, then consider biotin interference. If needed, order repeat testing after stopping biotin. Performed at [facility], [place] [address] [place], [place] [address]   . WBC 06/22/2019 1.2* 4.0 - 10.5 K/uL Final   Comment: REPEATED TO VERIFY THIS CRITICAL RESULT HAS VERIFIED AND BEEN CALLED TO HODGES,I BY POTEAT,SHANNON ON 03 02 2021 AT 0317, AND HAS BEEN READ BACK. CRITICAL RESULT VERIFIED   . RBC 06/22/2019 2.51* 3.87 - 5.11 MIL/uL Final  . Hemoglobin 06/22/2019 8.5* 12.0 - 15.0 g/dL Final  . HCT 06/22/2019 24.0* 36.0 - 46.0 % Final  . MCV 06/22/2019 95.6  80.0 - 100.0 fL Final  . MCH 06/22/2019 33.9  26.0 - 34.0 pg Final  . MCHC 06/22/2019 35.4  30.0 - 36.0 g/dL Final  . RDW 06/22/2019 15.2  11.5 - 15.5 % Final  . Platelets 06/22/2019 82* 150 - 400 K/uL Final   Comment: REPEATED TO VERIFY PLATELET COUNT CONFIRMED BY SMEAR Immature Platelet Fraction may be clinically indicated, consider ordering this additional test [id number] CONSISTENT WITH PREVIOUS RESULT   . nRBC 06/22/2019 3.5* 0.0 - 0.2 % Final  . Neutrophils Relative % 06/22/2019 34  % Final  . Neutro Abs 06/22/2019 0.4* 1.7 - 7.7 K/uL Final  . Lymphocytes Relative 06/22/2019 58  % Final  . Lymphs Abs 06/22/2019 0.7  0.7 - 4.0 K/uL Final  . Monocytes Relative 06/22/2019 4  % Final  . Monocytes Absolute 06/22/2019 0.1  0.1 - 1.0 K/uL Final  . Eosinophils Relative 06/22/2019 3  % Final  . Eosinophils Absolute 06/22/2019 0.0  0.0 - 0.5 K/uL Final  . Basophils Relative  06/22/2019 0  % Final  . Basophils Absolute 06/22/2019 0.0  0.0 - 0.1 K/uL Final  . Immature Granulocytes 06/22/2019 1  % Final  . Abs Immature Granulocytes 06/22/2019 0.01  0.00 - 0.07 K/uL Final  . Polychromasia 06/22/2019 PRESENT   Final  . Target Cells 06/22/2019 PRESENT   Final   Performed at [facility], [place] [address] [place], [place] [address]  . Prothrombin Time 06/22/2019 13.0  11.4 - 15.2 seconds Final  . INR 06/22/2019 1.0  0.8 - 1.2 Final   Comment: (NOTE) INR goal varies based on device and disease states. Performed at [facility], [place] [address] [place], [place] [address]   . aPTT 06/22/2019 30  24 - 36 seconds Final   Performed at [facility], [place] [address], [place], [place] [address]  . Total Protein 06/22/2019 8.4* 6.5 - 8.1 g/dL Final  . Albumin 06/22/2019 3.9  3.5 - 5.0 g/dL Final  . AST 06/22/2019 52* 15 - 41 U/L Final  . ALT 06/22/2019 36  0 - 44 U/L Final  . Alkaline Phosphatase 06/22/2019 80  38 - 126 U/L Final  . Total Bilirubin 06/22/2019 1.3* 0.3 - 1.2 mg/dL Final  . Bilirubin, Direct 06/22/2019 0.3* 0.0 - 0.2 mg/dL Final  . Indirect Bilirubin 06/22/2019 1.0* 0.3 - 0.9 mg/dL Final   Performed at [facility], [place] [address], [place], [place] [address]  . Path Review 06/22/2019 Reviewed by Kalman Drape, M.D.   Final   Comment: 06/22/2019 Pancytopenia.  Target cells. Performed at [facility], [place] [address] [place], [place] [address]     (this displays the last labs from the last 3 days)  No results found for: TOTALPROTELP, ALBUMINELP, A1GS, A2GS, BETS, BETA2SER, GAMS, MSPIKE, SPEI (this displays SPEP labs)  No results found for: KPAFRELGTCHN, LAMBDASER, KAPLAMBRATIO (kappa/lambda light chains)  No results found for: HGBA, HGBA2QUANT, HGBFQUANT, HGBSQUAN (Hemoglobinopathy evaluation)   No results found for: LDH  Lab Results  Component Value  Date   IRON 99 01/12/2013   TIBC 243 (L) 01/12/2013   IRONPCTSAT 41 01/12/2013   (Iron and TIBC)  Lab Results  Component Value Date   FERRITIN 42 01/12/2013    Urinalysis    Component Value Date/Time   COLORURINE YELLOW 01/17/2016 1202   APPEARANCEUR CLOUDY (A) 01/17/2016 1202   LABSPEC 1.020 03/30/2019 1215   PHURINE 6.0 03/30/2019 1215   GLUCOSEU NEGATIVE 03/30/2019 1215   HGBUR MODERATE (A) 03/30/2019 1215   BILIRUBINUR NEGATIVE 03/30/2019 [place] 03/30/2019 1215   PROTEINUR NEGATIVE 03/30/2019 1215   UROBILINOGEN 0.2 03/30/2019 1215   NITRITE NEGATIVE 03/30/2019 1215   LEUKOCYTESUR NEGATIVE 03/30/2019 1215     STUDIES: No results found.  ASSESSMENT: 45 y.o. [place] woman with newly progressive pancytopenia.    1.  Pancytopenia:   (a) Anemia: Iron studies, ANA, b12, folate, are pending, this is a long standing issue  (b) Thrombocytopenia and neutropenia: more recent onset since 2017  2. Hyperthyroidism:  (a) TSH, T3, and T4 normal today in the ER  (b) will refer to endocrinology  PLAN:  Silvanna met with Dr. Jana Hakim and myself today.  We reviewed that she is here due her blood counts being decreased.  We reviewed that all blood cells grow in the bone marrow.  There are three types of cells.    1.  WBCs: fight infection and are our immune cells.  Xochilt's are decreased.  When Dr. Jana Hakim reviewed the blood film he noted some atypical lymphocytes, however no indication of leukemia, or MDS.  2. Platlets: these are clotting cells.  Cartrinas are mildly low, but not of great concern at 87.    3.  RBCs: these carry oxygen.  Turkessa's are low, and she is anemic.    We reviewed that with the above decreases in her labs, we needed to do more lab tests than the basic ones she has already completed.  Dr. Jana Hakim reviewed that in detail.    He recommended that she f/u with gynecology about her heavy cycles.  If she sees gyn prior to her f/u  with [place], she was given Dr. Virgie Dad card, so he can review the office note.    Chantell also has hyperthyroidism.  I will operationalize her a referral to endocrinology.    Raksha will undergo further lab testing, and she will return in 2 weeks for f/u with Dr. Jana Hakim.  She was recommended to continue with the appropriate pandemic precautions. She knows to call for any questions that may arise between now and her next appointment.  We are happy to see her sooner if needed.    Total encounter time:45 minutes  Wilber Bihari, NP 06/22/19 10:27 AM Medical Oncology and Hematology [facility] [place], [place] [address] Tel. [phone]    Fax. [phone]  *Total Encounter Time as defined by the Centers for Medicare and Medicaid Services includes, in addition to the face-to-face time of a patient visit (documented in the note above) non-face-to-face time: obtaining and reviewing outside history, ordering and reviewing medications, tests or procedures, care coordination (communications with other health care professionals or caregivers) and documentation in the medical record.   ADDENDUM: I discussed the many possible causes of anemia with this patient as documented above.  Since the visit we have received the rest of the labs and found that she is severely B12 deficient.  I have called the patient at the number she gave [place], which is her sisters, and left a long and detailed note explaining that she needs to come in for B12 supplementation.  At this point I have not received confirmation but she is scheduled on 07/06/2019 to see me and to receive her first B12 dose.  I personally saw this patient and performed a substantive portion of this encounter with the listed APP documented above.   Chauncey Cruel, MD Medical Oncology and Hematology [facility] [address] [place], [place] [address] Tel. [phone]    Fax. [phone]

## 2019-06-22 NOTE — Telephone Encounter (Signed)
Patient walk-in. Patient came in after being discharged from hospital. Was told to contact Dr. Jana Hakim. MD sent sch msg to see patient on 3/3 but patient insisted on being seen today. Spoke with Magrinat and he ok'd to see her today. Scheduled appts and sent patient to check in.

## 2019-06-22 NOTE — Progress Notes (Signed)
PRELIMINARY NOTE: Called last night by Dr Christy Gentles re this 44 y/o [place] woman presenting with palpitations, found to have moderate pancytopenia, felt likely due to medication.  I reviewed her blood smear this AM. There is no suggestion of acute leukemia or MDS. Red cells: moderate anisocytosis, rare stomatocytes and targets, rare spherocytes, mild rouleaux.  Platelets: No artifactual clumps.  Kelch cells: Mostly lymphs and 2 populations, 1 mature, 1 atypical, with no nucleoli.  These appear reactive.  Neutrophils slightly hypolobulated.  We will follow up as outpatient, and see the patient on 06/23/2019 at 1 PM.  The patient will be contacted with this date and time.

## 2019-06-22 NOTE — ED Notes (Signed)
PT stated she is trying to find a ride. Pt provided phone and encouraged to call ride.

## 2019-06-22 NOTE — Progress Notes (Signed)
I called Mary Frey at home and with no answer I called her sister's number and left a voicemail so she can call us to schedule B-12 shots

## 2019-06-22 NOTE — ED Notes (Signed)
Date and time results received: 06/22/19 3:18 AM  (use smartphrase ".now" to insert current time)  Test: WBC Critical Value: 1.2  Name of Provider Notified: Dr.Wickline  Orders Received? Or Actions Taken?:

## 2019-06-23 ENCOUNTER — Telehealth: Payer: Self-pay | Admitting: Oncology

## 2019-06-23 LAB — ANTINUCLEAR ANTIBODIES, IFA: ANA Ab, IFA: NEGATIVE

## 2019-06-23 LAB — T3, FREE: T3, Free: 3.5 pg/mL (ref 2.0–4.4)

## 2019-06-23 LAB — SURGICAL PATHOLOGY

## 2019-06-23 NOTE — Telephone Encounter (Signed)
I left a message regarding schedule  

## 2019-06-24 ENCOUNTER — Other Ambulatory Visit: Payer: Self-pay | Admitting: Oncology

## 2019-06-24 LAB — FLOW CYTOMETRY

## 2019-06-25 ENCOUNTER — Other Ambulatory Visit: Payer: Self-pay | Admitting: Oncology

## 2019-07-05 ENCOUNTER — Encounter: Payer: Self-pay | Admitting: Oncology

## 2019-07-05 NOTE — Progress Notes (Signed)
[place]  [phone] [phone]     ID: Mary Frey DOB: 1975-06-04  MR#: [id number]  [id number]  Patient Care Team: System, Provider Not In as PCP - General Chauncey Cruel, MD OTHER MD:  CHIEF COMPLAINT: pancytopenia  CURRENT TREATMENT: B-12 injections   INTERVAL HISTORY: Mary Frey returns today for follow up of her pancytopenia.  Since her last visit here we obtained the results of the lab work and there is actually did not show iron deficiency which we had expected.  Instead it showed a severe B12 deficiency.  We contacted the patient by phone and let her and she is here today to discuss that further   REVIEW OF SYSTEMS: Mary Frey tells me she has made an appointment with physicians for women at [facility] for gynecologic follow-up.  She continues to have significant menorrhagia secondary to fibroids.  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   HISTORY OF CURRENT ILLNESS: From the original intake note:  Mary Frey is here for consultation at the request of the emergency room, after being evaluated last night for dizziness, and was found to be mildly neutropenic, anemic, and mildly thrombocytopenic.  She tells me that she has been anemic as long as she can remember, and struggles to take oral iron because it is constipating.  When She was taking oral iron, she notes she didn't feel like this.    Her WBC since 2017:   Ref. Range 06/27/2015 19:07 06/22/2019 00:12 06/22/2019 01:29  WBC Latest Ref Range: 4.0 - 10.5 K/uL 5.4 1.5 (L) 1.2 (LL)   Her hemoglobin since 2013:   Ref. Range 06/24/2011 20:42 01/12/2013 02:46 01/13/2013 04:40 03/15/2015 17:53 06/27/2015 19:07 06/22/2019 00:12 06/22/2019 01:29  Hemoglobin Latest Ref Range: 12.0 - 15.0 g/dL 9.8 (L) 10.4 (L) 10.0 (L) 7.6 (L) 7.2 (L) 10.1 (L) 8.5 (L)   The only iron tests we have on record are from 12/2012 and show  an iron level of 99, a ferritin of 42, and a TIBC of 243  Her platelets since 2017:   Ref. Range 06/27/2015 19:07 06/22/2019 00:12 06/22/2019 01:29  Platelets Latest Ref Range: 150 - 400 K/uL 329 87 (L) 82 (L)   The patient's subsequent history is as detailed below.   PAST MEDICAL HISTORY: Past Medical History:  Diagnosis Date  . Anemia   . Chronic back pain   . Fibroids   . Graves disease   . Hypertension   . Pregnancy induced hypertension     PAST SURGICAL HISTORY: Past Surgical History:  Procedure Laterality Date  . NO PAST SURGERIES      FAMILY HISTORY Family History  Problem Relation Age of Onset  . Diabetes Maternal Aunt   . Diabetes Maternal Uncle   . Diabetes Maternal Grandmother   . Diabetes Maternal Grandfather   . Stroke Paternal Grandfather     GYNECOLOGIC HISTORY:  Patient's last menstrual period was 06/21/2019 (exact date). Menarche: 44 years old Mary Frey 3 LMP 06/21/2019, regular but heavy for 4 days Contraceptive has been on depo provera and OCP in the past in her 98s  HRT none  Hysterectomy? no Salpingo-oophorectomy?no   SOCIAL HISTORY: (updated 06/2019) [place] notes that she goes back and forth between her home in [place], and a place in [place].  She declined to give any further information about her social history, and asked that I refer back to what  is loaded in Epic. This states she is single, has three living children, and works for [employer].    ADVANCED DIRECTIVES:    HEALTH MAINTENANCE: Social History   Tobacco Use  . Smoking status: Light Tobacco Smoker    Years: 14.00    Types: Cigarettes  . Smokeless tobacco: Never Used  Substance Use Topics  . Alcohol use: Yes    Alcohol/week: 0.0 standard drinks    Comment: on occasion  . Drug use: No    Types: Cocaine     Colonoscopy: never done (age)  PAP:  Bone density: never done (age)   Allergies  Allergen Reactions  . Hydrocodone Nausea And Vomiting  . Latex Itching and Rash     Current Outpatient Medications  Medication Sig Dispense Refill  . acetaminophen (TYLENOL) 500 MG tablet Take 1,500 mg by mouth every 6 (six) hours as needed for moderate pain.    Marland Kitchen aspirin EC 81 MG tablet Take 81 mg by mouth daily.    . ferrous sulfate 325 (65 FE) MG EC tablet Take 325 mg by mouth daily with breakfast.    . hydrochlorothiazide (HYDRODIURIL) 12.5 MG tablet Take 12.5 mg by mouth daily.    . hydrOXYzine (ATARAX/VISTARIL) 25 MG tablet Take 25 mg by mouth every 6 (six) hours as needed.    . methimazole (TAPAZOLE) 10 MG tablet Take 10 mg in the morning and 5 mg in the evening. (Patient not taking: Reported on 06/21/2019) 45 tablet 0  . risperiDONE (RISPERDAL) 3 MG tablet Take 3 mg by mouth at bedtime.    . traZODone (DESYREL) 50 MG tablet Take 50 mg by mouth at bedtime.     No current facility-administered medications for this visit.    OBJECTIVE: Mary Frey in no acute distress  Vitals:   07/06/19 0947  BP: (!) 130/93  Pulse: 90  Temp: 97.8 F (36.6 C)  SpO2: 99%     Body mass index is 21.14 kg/m.   Wt Readings from Last 3 Encounters:  07/06/19 131 lb (59.4 kg)  06/22/19 128 lb 9.6 oz (58.3 kg)  06/21/19 118 lb (53.5 kg)      ECOG FS:1 - Symptomatic but completely ambulatory   LAB RESULTS:  CMP     Component Value Date/Time   NA 140 06/22/2019 0901   K 3.5 06/22/2019 0901   CL 104 06/22/2019 0901   CO2 29 06/22/2019 0901   GLUCOSE 101 (H) 06/22/2019 0901   BUN 14 06/22/2019 0901   CREATININE 0.73 06/22/2019 0901   CALCIUM 8.6 (L) 06/22/2019 0901   PROT 8.2 (H) 06/22/2019 0901   ALBUMIN 3.7 06/22/2019 0901   AST 50 (H) 06/22/2019 0901   ALT 34 06/22/2019 0901   ALKPHOS 88 06/22/2019 0901   BILITOT 0.9 06/22/2019 0901   GFRNONAA >60 06/22/2019 0901   GFRAA >60 06/22/2019 0901    No results found for: TOTALPROTELP, ALBUMINELP, A1GS, A2GS, BETS, BETA2SER, GAMS, MSPIKE, SPEI  No results found for: KPAFRELGTCHN, LAMBDASER,  KAPLAMBRATIO  Lab Results  Component Value Date   WBC 1.4 (L) 06/22/2019   NEUTROABS 0.7 (L) 06/22/2019   HGB 9.6 (L) 06/22/2019   HCT 26.8 (L) 06/22/2019   MCV 95.4 06/22/2019   PLT 92 (L) 06/22/2019      Chemistry      Component Value Date/Time   NA 140 06/22/2019 0901   K 3.5 06/22/2019 0901   CL 104 06/22/2019 0901   CO2 29 06/22/2019 0901   BUN  14 06/22/2019 0901   CREATININE 0.73 06/22/2019 0901      Component Value Date/Time   CALCIUM 8.6 (L) 06/22/2019 0901   ALKPHOS 88 06/22/2019 0901   AST 50 (H) 06/22/2019 0901   ALT 34 06/22/2019 0901   BILITOT 0.9 06/22/2019 0901       No results found for: LABCA2  No components found for: [id number]  No results for input(s): INR in the last 168 hours.  No results found for: LABCA2  No results found for: [id number]  No results found for: [id number]  No results found for: [id number]  No results found for: CA2729  No components found for: HGQUANT  No results found for: CEA1 / No results found for: CEA1   No results found for: AFPTUMOR  No results found for: CHROMOGRNA  No results found for: HGBA, HGBA2QUANT, HGBFQUANT, HGBSQUAN (Hemoglobinopathy evaluation)   No results found for: LDH  Lab Results  Component Value Date   IRON 108 06/22/2019   TIBC 266 06/22/2019   IRONPCTSAT 41 06/22/2019   (Iron and TIBC)  Lab Results  Component Value Date   FERRITIN 91 06/22/2019    Urinalysis    Component Value Date/Time   COLORURINE YELLOW 01/17/2016 1202   APPEARANCEUR CLOUDY (A) 01/17/2016 1202   LABSPEC 1.020 03/30/2019 1215   PHURINE 6.0 03/30/2019 1215   GLUCOSEU NEGATIVE 03/30/2019 1215   HGBUR MODERATE (A) 03/30/2019 1215   BILIRUBINUR NEGATIVE 03/30/2019 1215   KETONESUR NEGATIVE 03/30/2019 1215   PROTEINUR NEGATIVE 03/30/2019 1215   UROBILINOGEN 0.2 03/30/2019 1215   NITRITE NEGATIVE 03/30/2019 1215   LEUKOCYTESUR NEGATIVE 03/30/2019 1215    STUDIES: No results found.   ASSESSMENT: 44 y.o.  [place] Frey with newly progressive pancytopenia.    1.  Pancytopenia:   (a) baseline lab work 06/22/2019 shows severe B12 deficiency  (b) Thrombocytopenia and neutropenia: more recent onset since 2017  (c) B12 injections started 07/06/2019  2. Hyperthyroidism:  (a) TSH, T3, and T4 normal today in the ER  (b) will refer to endocrinology   PLAN: Mary Frey turns out to have severe B12 deficiency.  We discussed the fact that this is usually an absorption problem since most people except committed vegan's eat enough B12 in their diet.  I am going to be obtaining antiparietal cell and antiintrinsic factor antibodies with the next set of labs here.  She has made an appointment with gynecology to deal with the heavy periods and fibroids.  I gave her my card so they can copy me with her evaluation and plan  Otherwise we will give Mary Frey her B12 shots here every week for 4 weeks and then every month indefinitely.  She will see me with her August dose.  We will repeat lab work at that time.  She knows to call for any other issue that may develop before the next visit.  Total encounter time 25 minutes.Sarajane Jews C. Nkosi Cortright, MD 07/06/19 10:17 AM Medical Oncology and Hematology [facility] [place], [place] [address] Tel. [phone]    Fax. [phone]   I, Wilburn Mylar, am acting as scribe for Dr. Virgie Dad. Joss Friedel.  I, Lurline Del MD, have reviewed the above documentation for accuracy and completeness, and I agree with the above.    *Total Encounter Time as defined by the Centers for Medicare and Medicaid Services includes, in addition to the face-to-face time of a patient visit (documented in the note above) non-face-to-face time: obtaining and reviewing  outside history, ordering and reviewing medications, tests or procedures, care coordination (communications with other health care professionals or caregivers) and documentation in the medical  record.

## 2019-07-06 ENCOUNTER — Inpatient Hospital Stay: Payer: Medicaid - Out of State

## 2019-07-06 ENCOUNTER — Inpatient Hospital Stay (HOSPITAL_BASED_OUTPATIENT_CLINIC_OR_DEPARTMENT_OTHER): Payer: Medicaid - Out of State | Admitting: Oncology

## 2019-07-06 ENCOUNTER — Other Ambulatory Visit: Payer: Self-pay

## 2019-07-06 VITALS — BP 130/93 | HR 90 | Temp 97.8°F | Ht 66.0 in | Wt 131.0 lb

## 2019-07-06 DIAGNOSIS — D61818 Other pancytopenia: Secondary | ICD-10-CM

## 2019-07-06 DIAGNOSIS — E538 Deficiency of other specified B group vitamins: Secondary | ICD-10-CM

## 2019-07-06 MED ORDER — CYANOCOBALAMIN 1000 MCG/ML IJ SOLN
1000.0000 ug | Freq: Once | INTRAMUSCULAR | Status: AC
  Administered 2019-07-06: 1000 ug via INTRAMUSCULAR

## 2019-07-06 NOTE — Patient Instructions (Signed)
Cyanocobalamin, Pyridoxine, and Folate What is this medicine? A multivitamin containing folic acid, vitamin B6, and vitamin B12. This medicine may be used for other purposes; ask your health care provider or pharmacist if you have questions. COMMON BRAND NAME(S): AllanFol RX, AllanTex, Av-Vite FB, B Complex with Folic Acid, ComBgen, FaBB, Folamin, Folastin, Folbalin, Folbee, Folbic, Folcaps, Folgard, Folgard RX, Folgard RX 2.2, Folplex, Folplex 2.2, Foltabs 800, Foltx, Homocysteine Formula, Niva-Fol, NuFol, TL Gard RX, Virt-Gard, Virt-Vite, Virt-Vite Forte, Vita-Respa What should I tell my health care provider before I take this medicine? They need to know if you have any of these conditions:  bleeding or clotting disorder  history of anemia of any type  other chronic health condition  an unusual or allergic reaction to vitamins, other medicines, foods, dyes, or preservatives  pregnant or trying to get pregnant  breast-feeding How should I use this medicine? Take by mouth with a glass of water. May take with food. Follow the directions on the prescription label. It is usually given once a day. Do not take your medicine more often than directed. Contact your pediatrician regarding the use of this medicine in children. Special care may be needed. Overdosage: If you think you have taken too much of this medicine contact a poison control center or emergency room at once. NOTE: This medicine is only for you. Do not share this medicine with others. What if I miss a dose? If you miss a dose, take it as soon as you can. If it is almost time for your next dose, take only that dose. Do not take double or extra doses. What may interact with this medicine?  levodopa This list may not describe all possible interactions. Give your health care provider a list of all the medicines, herbs, non-prescription drugs, or dietary supplements you use. Also tell them if you smoke, drink alcohol, or use illegal  drugs. Some items may interact with your medicine. What should I watch for while using this medicine? See your health care professional for regular checks on your progress. Remember that vitamin supplements do not replace the need for good nutrition from a balanced diet. What side effects may I notice from receiving this medicine? Side effects that you should report to your doctor or health care professional as soon as possible:  allergic reaction such as skin rash or difficulty breathing  vomiting Side effects that usually do not require medical attention (report to your doctor or health care professional if they continue or are bothersome):  nausea  stomach upset This list may not describe all possible side effects. Call your doctor for medical advice about side effects. You may report side effects to FDA at 1-800-FDA-1088. Where should I keep my medicine? Keep out of the reach of children. Most vitamins should be stored at controlled room temperature. Check your specific product directions. Protect from heat and moisture. Throw away any unused medicine after the expiration date. NOTE: This sheet is a summary. It may not cover all possible information. If you have questions about this medicine, talk to your doctor, pharmacist, or health care provider.  2020 Elsevier/Gold Standard (2007-05-30 00:59:55)  

## 2019-07-07 ENCOUNTER — Telehealth: Payer: Self-pay | Admitting: Oncology

## 2019-07-07 NOTE — Telephone Encounter (Signed)
Scheduled appts per 3/16 los. I called both numbers on file. One was disconnected and the other did not have a voicemail box to leave a msg. Mailed reminder letter and calendar.

## 2019-07-13 ENCOUNTER — Inpatient Hospital Stay: Payer: Medicaid - Out of State

## 2019-07-20 ENCOUNTER — Inpatient Hospital Stay: Payer: Medicaid - Out of State

## 2019-07-21 ENCOUNTER — Other Ambulatory Visit: Payer: Self-pay

## 2019-07-21 ENCOUNTER — Encounter: Payer: Self-pay | Admitting: Obstetrics & Gynecology

## 2019-07-21 ENCOUNTER — Ambulatory Visit: Payer: Medicaid - Out of State | Admitting: Obstetrics & Gynecology

## 2019-07-21 VITALS — BP 149/94 | HR 92 | Ht 65.0 in | Wt 130.4 lb

## 2019-07-21 DIAGNOSIS — N920 Excessive and frequent menstruation with regular cycle: Secondary | ICD-10-CM | POA: Diagnosis not present

## 2019-07-21 DIAGNOSIS — D219 Benign neoplasm of connective and other soft tissue, unspecified: Secondary | ICD-10-CM

## 2019-07-21 DIAGNOSIS — D259 Leiomyoma of uterus, unspecified: Secondary | ICD-10-CM

## 2019-07-21 DIAGNOSIS — D649 Anemia, unspecified: Secondary | ICD-10-CM | POA: Diagnosis not present

## 2019-07-21 MED ORDER — NORETHIN ACE-ETH ESTRAD-FE 1-20 MG-MCG(24) PO TABS: 1.0000 | ORAL_TABLET | Freq: Every day | ORAL | 11 refills | Status: DC

## 2019-07-21 MED ORDER — PRENATAL COMPLETE 14-0.4 MG PO TABS: 1.0000 | ORAL_TABLET | Freq: Every day | ORAL | 2 refills | Status: DC

## 2019-07-21 NOTE — Patient Instructions (Signed)
Uterine Fibroids  Uterine fibroids are lumps of tissue (tumors) in your womb (uterus). They are not cancer (are benign). Most women with this condition do not need treatment. Sometimes fibroids can affect your ability to have children (your fertility). If that happens, you may need surgery to take out the fibroids. Follow these instructions at home:  Take over-the-counter and prescription medicines only as told by your doctor. Your doctor may suggest NSAIDs (such as aspirin or ibuprofen) to help with pain.  Ask your doctor if you should: ? Take iron pills. ? Eat more foods that have iron in them, such as dark green, leafy vegetables.  If directed, apply heat to your back or belly to reduce pain. Use the heat source that your doctor recommends, such as a moist heat pack or a heating pad. ? Put a towel between your skin and the heat source. ? Leave the heat on for 20-30 minutes. ? Remove the heat if your skin turns bright red. This is especially important if you are unable to feel pain, heat, or cold. You may have a greater risk of getting burned.  Pay close attention to your period (menstrual) cycles. Tell your doctor about any changes, such as: ? A heavier blood flow than usual. ? Needing to use more pads or tampons than normal. ? A change in how many days your period lasts. ? A change in symptoms that come with your period, such as cramps or back pain.  Keep all follow-up visits as told by your doctor. This is important. Your doctor may need to watch your fibroids over time for any changes. Contact a doctor if you:  Have pain that does not get better with medicine or heat, such as pain or cramps in: ? Your back. ? The area between your hip bones (pelvic area). ? Your belly.  Have new bleeding between your periods.  Have more bleeding during or between your periods.  Feel very tired or weak.  Feel light-headed. Get help right away if you:  Pass out (faint).  Have pain in the  area between your hip bones that suddenly gets worse.  Have bleeding that soaks a tampon or pad in 30 minutes or less. Summary  Uterine fibroids are lumps of tissue (tumors) in your womb (uterus). They are not cancer.  The only treatment that most women need is taking aspirin or ibuprofen for pain.  Contact a doctor if you have pain or cramps that do not get better with medicine.  Make sure you know what symptoms you should get help for right away. This information is not intended to replace advice given to you by your health care provider. Make sure you discuss any questions you have with your health care provider. Document Revised: 03/21/2017 Document Reviewed: 03/04/2017 Elsevier Patient Education  2020 Elsevier Inc.  

## 2019-07-21 NOTE — Progress Notes (Signed)
Patient ID: Mary Frey, female   DOB: [birth year], 44 y.o.   MRN: [id number]  Chief Complaint  Patient presents with  . New Patient (Initial Visit)  heavy periods  HPI Mary Frey is a 44 y.o. female.  [id number] Patient's last menstrual period was 06/21/2019 (exact date). She has a history of heavy regular menses with uterine fibroid. She moved back to [place] recently, she saw a Gyn about 2 years ago and had an [place]. She would like cycle control and would use OCP.  HPI  Past Medical History:  Diagnosis Date  . Anemia   . Chronic back pain   . Fibroids   . Graves disease   . Hypertension   . Pregnancy induced hypertension     Past Surgical History:  Procedure Laterality Date  . NO PAST SURGERIES      Family History  Problem Relation Age of Onset  . Diabetes Maternal Aunt   . Diabetes Maternal Uncle   . Diabetes Maternal Grandmother   . Diabetes Maternal Grandfather   . Stroke Paternal Grandfather   . Breast cancer Mother     Social History Social History   Tobacco Use  . Smoking status: Light Tobacco Smoker    Years: 14.00    Types: Cigarettes  . Smokeless tobacco: Never Used  Substance Use Topics  . Alcohol use: Yes    Alcohol/week: 0.0 standard drinks    Comment: on occasion  . Drug use: No    Types: Cocaine    Allergies  Allergen Reactions  . Hydrocodone Nausea And Vomiting  . Latex Itching and Rash    Current Outpatient Medications  Medication Sig Dispense Refill  . acetaminophen (TYLENOL) 500 MG tablet Take 1,500 mg by mouth every 6 (six) hours as needed for moderate pain.    Marland Kitchen aspirin EC 81 MG tablet Take 81 mg by mouth daily.    . ferrous sulfate 325 (65 FE) MG EC tablet Take 325 mg by mouth daily with breakfast.    . hydrochlorothiazide (HYDRODIURIL) 12.5 MG tablet Take 12.5 mg by mouth daily.    . hydrOXYzine (ATARAX/VISTARIL) 25 MG tablet Take 25 mg by mouth every 6 (six) hours as needed.    . risperiDONE (RISPERDAL) 3 MG tablet Take 3 mg by  mouth at bedtime.    . traZODone (DESYREL) 50 MG tablet Take 50 mg by mouth at bedtime.    . methimazole (TAPAZOLE) 10 MG tablet Take 10 mg in the morning and 5 mg in the evening. (Patient not taking: Reported on 06/21/2019) 45 tablet 0  . Norethindrone Acetate-Ethinyl Estrad-FE (LOESTRIN 24 FE) 1-20 MG-MCG(24) tablet Take 1 tablet by mouth daily. 1 Package 11  . Prenatal Vit-Fe Fumarate-FA (PRENATAL COMPLETE) 14-0.4 MG TABS Take 1 tablet by mouth daily. 60 tablet 2   No current facility-administered medications for this visit.    Review of Systems Review of Systems  Constitutional: Positive for unexpected weight change (loss).  Respiratory: Negative.   Cardiovascular: Negative.   Gastrointestinal: Negative.   Genitourinary: Positive for menstrual problem. Negative for vaginal bleeding and vaginal discharge.  Musculoskeletal: Positive for neck pain.  Psychiatric/Behavioral: Negative.     Blood pressure (!) 149/94, pulse 92, height 5\' 5"  (1.651 m), weight 130 lb 6.4 oz (59.1 kg), last menstrual period 06/21/2019.  Physical Exam Physical Exam Constitutional:      Appearance: Normal appearance.  Pulmonary:     Effort: Pulmonary effort is normal.  Abdominal:     General: Abdomen  is flat.     Palpations: Abdomen is soft. There is mass (possible 12 week uterine).  Neurological:     Mental Status: She is alert.     Data Reviewed [place] 2016 reviewed- 4 cm fibroid CBC    Component Value Date/Time   WBC 1.4 (L) 06/22/2019 0901   RBC 2.85 (L) 06/22/2019 0902   RBC 2.81 (L) 06/22/2019 0901   HGB 9.6 (L) 06/22/2019 0901   HCT 26.8 (L) 06/22/2019 0901   PLT 92 (L) 06/22/2019 0901   MCV 95.4 06/22/2019 0901   MCH 34.2 (H) 06/22/2019 0901   MCHC 35.8 06/22/2019 0901   RDW 14.9 06/22/2019 0901   LYMPHSABS 0.7 06/22/2019 0901   MONOABS 0.1 06/22/2019 0901   EOSABS 0.0 06/22/2019 0901   BASOSABS 0.0 06/22/2019 0901    Assessment Menorrhagia Uterine fibroid Anemia Plan Use  Naproxen during menses Loestrin prescribed-currently abstinent  Prenatal vitamin requested RTC for f/u with female provider and review records     Emeterio Reeve 07/21/2019, 1:47 PM

## 2019-07-21 NOTE — Progress Notes (Signed)
New patient in office for heavy cycles, pt reports that she is changing pad every 2 hours, LMP 06-21-19. Pt states that she is having a cycle every month that lasts for 7 days.

## 2019-07-27 ENCOUNTER — Inpatient Hospital Stay: Payer: Medicaid - Out of State | Attending: Adult Health

## 2019-07-27 DIAGNOSIS — D61818 Other pancytopenia: Secondary | ICD-10-CM | POA: Insufficient documentation

## 2019-07-27 DIAGNOSIS — Z833 Family history of diabetes mellitus: Secondary | ICD-10-CM | POA: Insufficient documentation

## 2019-07-27 DIAGNOSIS — E538 Deficiency of other specified B group vitamins: Secondary | ICD-10-CM | POA: Insufficient documentation

## 2019-07-27 DIAGNOSIS — E0590 Thyrotoxicosis, unspecified without thyrotoxic crisis or storm: Secondary | ICD-10-CM | POA: Insufficient documentation

## 2019-07-27 DIAGNOSIS — Z79899 Other long term (current) drug therapy: Secondary | ICD-10-CM | POA: Insufficient documentation

## 2019-07-27 DIAGNOSIS — Z823 Family history of stroke: Secondary | ICD-10-CM | POA: Insufficient documentation

## 2019-07-28 ENCOUNTER — Other Ambulatory Visit: Payer: Self-pay

## 2019-07-28 ENCOUNTER — Telehealth: Payer: Self-pay | Admitting: Oncology

## 2019-07-28 ENCOUNTER — Inpatient Hospital Stay: Payer: Medicaid - Out of State

## 2019-07-28 VITALS — BP 133/81 | HR 81 | Temp 98.7°F | Resp 18

## 2019-07-28 DIAGNOSIS — E0590 Thyrotoxicosis, unspecified without thyrotoxic crisis or storm: Secondary | ICD-10-CM | POA: Diagnosis not present

## 2019-07-28 DIAGNOSIS — D61818 Other pancytopenia: Secondary | ICD-10-CM | POA: Diagnosis not present

## 2019-07-28 DIAGNOSIS — E538 Deficiency of other specified B group vitamins: Secondary | ICD-10-CM

## 2019-07-28 DIAGNOSIS — Z79899 Other long term (current) drug therapy: Secondary | ICD-10-CM | POA: Diagnosis not present

## 2019-07-28 DIAGNOSIS — Z823 Family history of stroke: Secondary | ICD-10-CM | POA: Diagnosis not present

## 2019-07-28 DIAGNOSIS — Z833 Family history of diabetes mellitus: Secondary | ICD-10-CM | POA: Diagnosis not present

## 2019-07-28 MED ORDER — CYANOCOBALAMIN 1000 MCG/ML IJ SOLN
INTRAMUSCULAR | Status: AC
  Filled 2019-07-28: qty 1

## 2019-07-28 MED ORDER — CYANOCOBALAMIN 1000 MCG/ML IJ SOLN
1000.0000 ug | Freq: Once | INTRAMUSCULAR | Status: AC
  Administered 2019-07-28: 13:00:00 1000 ug via INTRAMUSCULAR

## 2019-07-28 NOTE — Telephone Encounter (Signed)
Rescheduled missed appt from 4/6

## 2019-07-28 NOTE — Patient Instructions (Signed)

## 2019-08-23 ENCOUNTER — Ambulatory Visit: Payer: Medicaid - Out of State | Admitting: Obstetrics and Gynecology

## 2019-08-23 ENCOUNTER — Ambulatory Visit (HOSPITAL_BASED_OUTPATIENT_CLINIC_OR_DEPARTMENT_OTHER): Payer: Medicaid - Out of State | Admitting: Medical

## 2019-08-23 ENCOUNTER — Inpatient Hospital Stay: Payer: Medicaid - Out of State | Attending: Adult Health

## 2019-08-23 ENCOUNTER — Other Ambulatory Visit: Payer: Self-pay

## 2019-08-23 DIAGNOSIS — E538 Deficiency of other specified B group vitamins: Secondary | ICD-10-CM

## 2019-08-24 ENCOUNTER — Telehealth: Payer: Self-pay

## 2019-08-24 ENCOUNTER — Inpatient Hospital Stay: Payer: Medicaid - Out of State

## 2019-08-24 NOTE — Progress Notes (Signed)
This patient was seen in the infusion room this afternoon.  She presented as a walk-in and stated that she be seen by someone immediately.  Her last vitamin B12 injection was 1 month ago.  She reports today that she walked up 3 flights of stairs and became acutely weak on her left side.  She called EMS.  Her blood pressure was elevated at 180/98.  She reports that she has not taken any of her blood pressure medicines for the last month and a half as she has been walking more and believed that this would take care of of her elevated blood pressure.  She reports having an episode of itching last night.  She also reports that she has a history of hyperthyroidism but has been off of her medications as she was "told to stop them by her doctor."  She is concerned that her vitamin B12 shot which was given on 07/28/2019 could have caused her episodes of weakness today.  Although she was seen by EMS she elected not to go to the emergency room for further evaluation.  I attempted to explain to her that the vitamin B12 shot did not cause her symptoms today.  I did explain to her that individuals that have low vitamin B12 levels can develop neuropathy.  I also explained to her that her poorly controlled hypertension could precipitate similar symptoms.  I told her that she needed to follow-up with her endocrinologist and her primary care provider.  I discussed that individuals can sometimes have TIAs that could present with symptoms such as weakness and that this needed to be evaluated further by her primary care provider as patients with TIAs or an increased risk of having strokes.  The patient then remarked to me "you are saying that I had a stroke."  I reiterated to her that I did not say that she had had a stroke.  She will hold her vitamin B12 injection today and will follow up with her primary care provider and with her endocrinologist.  Sandi Mealy, MHS, PA-C Physician Assistant

## 2019-08-24 NOTE — Telephone Encounter (Signed)
Patient presented to infusion room around 5:15pm on 08/23/19 for her monthly B12 injection. Patient stated she was supposed to get her injection on 08/24/19 but had called the EMS earlier in the day due to "symptoms" she was having.  She then stated the EMS told her she was having a reaction to the B12 injections she was getting.  Her last B12 injection was on 07/28/19. Mary Mealy, PA notified and came to infusion room to see patient. After talking with Mary Frey, patient decided not to get her B12 injection. Mary Frey instructed patient to follow-up with her primary care physician regarding the "symptoms" she had earlier in the day.  Patient did state that she had stopped taking her blood pressure medications and thyroid medications due to recently starting an exercise program.   Message sent to Dr. Jana Hakim informing him of patient's missed B12 injection.

## 2019-08-26 ENCOUNTER — Telehealth: Payer: Self-pay | Admitting: Oncology

## 2019-08-26 NOTE — Telephone Encounter (Signed)
Called pt per sch message - number not in service. Put note in for next appt to print updated schedule. Per 5/6 sch message

## 2019-08-30 ENCOUNTER — Ambulatory Visit (HOSPITAL_COMMUNITY): Payer: Medicaid - Out of State

## 2019-09-07 ENCOUNTER — Telehealth: Payer: Self-pay

## 2019-09-07 NOTE — Telephone Encounter (Signed)
ATC pt on both numbers in the chart  One is now out of service the other is just ringing with no pick up  Pt returned call, I asked when she spoke with Dr. Cruzita Lederer. She said she did not but that the person she spoke with today told her she did and that she could be seen.  I apologized for the confusion and attempted many times to explain. With every attempt I made I was interrupted and in the end was unable to get a word in regarding helping her in anyway I could. Pt hung up

## 2019-09-07 NOTE — Telephone Encounter (Signed)
Patient calling today stating even though she has been dismissed she was told by Dr. Cruzita Lederer she can come back-I did review her chart and referral and do not see any documentation of this-patient would like to speak to the office manager to get this straightened out

## 2019-09-21 ENCOUNTER — Inpatient Hospital Stay: Payer: Medicaid - Out of State | Attending: Adult Health

## 2019-10-06 ENCOUNTER — Other Ambulatory Visit: Payer: Self-pay

## 2019-10-06 ENCOUNTER — Encounter (HOSPITAL_COMMUNITY): Payer: Self-pay | Admitting: Emergency Medicine

## 2019-10-06 ENCOUNTER — Emergency Department (HOSPITAL_COMMUNITY)
Admission: EM | Admit: 2019-10-06 | Discharge: 2019-10-06 | Disposition: A | Discharge status: Home / Self Care | Payer: Medicaid - Out of State | Attending: Emergency Medicine | Admitting: Emergency Medicine

## 2019-10-06 DIAGNOSIS — Z885 Allergy status to narcotic agent status: Secondary | ICD-10-CM | POA: Insufficient documentation

## 2019-10-06 DIAGNOSIS — I110 Hypertensive heart disease with heart failure: Secondary | ICD-10-CM | POA: Diagnosis not present

## 2019-10-06 DIAGNOSIS — R110 Nausea: Secondary | ICD-10-CM | POA: Insufficient documentation

## 2019-10-06 DIAGNOSIS — F17210 Nicotine dependence, cigarettes, uncomplicated: Secondary | ICD-10-CM | POA: Diagnosis not present

## 2019-10-06 DIAGNOSIS — I5021 Acute systolic (congestive) heart failure: Secondary | ICD-10-CM | POA: Insufficient documentation

## 2019-10-06 DIAGNOSIS — Z7982 Long term (current) use of aspirin: Secondary | ICD-10-CM | POA: Insufficient documentation

## 2019-10-06 DIAGNOSIS — R1030 Lower abdominal pain, unspecified: Secondary | ICD-10-CM | POA: Diagnosis present

## 2019-10-06 DIAGNOSIS — Z91040 Latex allergy status: Secondary | ICD-10-CM | POA: Diagnosis not present

## 2019-10-06 DIAGNOSIS — R350 Frequency of micturition: Secondary | ICD-10-CM | POA: Insufficient documentation

## 2019-10-06 DIAGNOSIS — Z79899 Other long term (current) drug therapy: Secondary | ICD-10-CM | POA: Insufficient documentation

## 2019-10-06 LAB — URINALYSIS, ROUTINE W REFLEX MICROSCOPIC
Glucose, UA: NEGATIVE mg/dL
Hgb urine dipstick: NEGATIVE
Ketones, ur: NEGATIVE mg/dL
Leukocytes,Ua: NEGATIVE
Nitrite: NEGATIVE
Protein, ur: NEGATIVE mg/dL
Specific Gravity, Urine: 1.02 (ref 1.005–1.030)
pH: 5 (ref 5.0–8.0)

## 2019-10-06 LAB — PREGNANCY, URINE: Preg Test, Ur: NEGATIVE

## 2019-10-06 MED ORDER — SODIUM CHLORIDE 0.9% FLUSH: 3.0000 mL | Freq: Once | INTRAVENOUS | Status: DC

## 2019-10-06 NOTE — ED Notes (Signed)
Patient refused lab draw.

## 2019-10-06 NOTE — ED Provider Notes (Signed)
[facility] DEPT Provider Note   CSN: [id number] Arrival date & time: 10/06/19  1615     History Chief Complaint  Patient presents with  . Abdominal Pain  . Nausea    Mary Frey is a 44 y.o. female.  HPI  Patient is a 44 year old female with past medical history of Graves' disease on methimazole, anemia, chronic back pain, hypertension  Patient presented today with urinary frequency and urgency without dysuria.  She states that she is concerned she has a UTI.  She states she has had some crampy abdominal pain for approximately 1 week now.  Patient endorses some nausea but no vomiting.  She denies any fevers or chills.  She states that she has felt some symptoms of her hyperthyroidism include feeling that her heart rate is elevated and diarrhea.  She states she dialed back on her methimazole because she thought she was having allergic reaction and has been in touch with her endocrinologist about this and is planning on seeing her endocrinologist soon.  Patient is also inquiring about having an ultrasound to look at her fibroids.  She states that she does not have a OB/GYN however.     Past Medical History:  Diagnosis Date  . Anemia   . Chronic back pain   . Fibroids   . Graves disease   . Hypertension   . Pregnancy induced hypertension     Patient Active Problem List   Diagnosis Date Noted  . Other pancytopenia ([place]) 06/22/2019  . Cobalamin deficiency 06/22/2019  . Cocaine abuse ([place]) 01/12/2013  . Protein-calorie malnutrition, severe ([place]) 01/12/2013  . Graves disease 01/12/2013  . Acute systolic CHF (congestive heart failure) ([place]) 01/12/2013  . HTN (hypertension) 01/12/2013  . Microcytic anemia 01/12/2013  . Tobacco use disorder 01/12/2013    Past Surgical History:  Procedure Laterality Date  . NO PAST SURGERIES       OB History    Gravida  5   Para  3   Term  3   Preterm      AB  2   Living  3     SAB       TAB  1   Ectopic  1   Multiple      Live Births              Family History  Problem Relation Age of Onset  . Diabetes Maternal Aunt   . Diabetes Maternal Uncle   . Diabetes Maternal Grandmother   . Diabetes Maternal Grandfather   . Stroke Paternal Grandfather   . Breast cancer Mother     Social History   Tobacco Use  . Smoking status: Light Tobacco Smoker    Years: 14.00    Types: Cigarettes  . Smokeless tobacco: Never Used  Substance Use Topics  . Alcohol use: Yes    Alcohol/week: 0.0 standard drinks    Comment: on occasion  . Drug use: No    Types: Cocaine    Home Medications Prior to Admission medications   Medication Sig Start Date End Date Taking? Authorizing Provider  acetaminophen (TYLENOL) 500 MG tablet Take 1,500 mg by mouth every 6 (six) hours as needed for moderate pain.    [provider]  aspirin EC 81 MG tablet Take 81 mg by mouth daily.    [provider]  ferrous sulfate 325 (65 FE) MG EC tablet Take 325 mg by mouth daily with breakfast.    [provider]  hydrochlorothiazide (HYDRODIURIL) 12.5 MG tablet Take 12.5 mg by mouth daily.    [provider]  hydrOXYzine (ATARAX/VISTARIL) 25 MG tablet Take 25 mg by mouth every 6 (six) hours as needed.    [provider]  methimazole (TAPAZOLE) 10 MG tablet Take 10 mg in the morning and 5 mg in the evening. Patient not taking: Reported on 06/21/2019 06/28/15   Antonietta Breach, PA-C  Norethindrone Acetate-Ethinyl Estrad-FE (LOESTRIN 24 FE) 1-20 MG-MCG(24) tablet Take 1 tablet by mouth daily. 07/21/19   Woodroe Mode, MD  Prenatal Vit-Fe Fumarate-FA (PRENATAL COMPLETE) 14-0.4 MG TABS Take 1 tablet by mouth daily. 07/21/19   Woodroe Mode, MD  risperiDONE (RISPERDAL) 3 MG tablet Take 3 mg by mouth at bedtime.    [provider]  traZODone (DESYREL) 50 MG tablet Take 50 mg by mouth at bedtime.    [provider]  norethindrone (MICRONOR,CAMILA,ERRIN)  0.35 MG tablet Take 1 tablet (0.35 mg total) by mouth daily. Patient not taking: Reported on 06/27/2015 03/30/14 03/30/19  Lahoma Crocker, MD  propranolol (INDERAL) 20 MG tablet Take 1 tablet (20 mg total) by mouth 2 (two) times daily. Patient not taking: Reported on 06/27/2015 08/12/13 03/30/19  Philemon Kingdom, MD    Allergies    Hydrocodone and Latex  Review of Systems   Review of Systems  Constitutional: Negative for chills and fever.  HENT: Negative for congestion.   Eyes: Negative for pain.  Respiratory: Negative for cough and shortness of breath.   Cardiovascular: Negative for chest pain and leg swelling.  Gastrointestinal: Positive for nausea. Negative for abdominal pain and vomiting.  Genitourinary: Positive for frequency and urgency. Negative for dysuria and flank pain.  Musculoskeletal: Negative for myalgias.  Skin: Negative for rash.  Neurological: Negative for dizziness and headaches.    Physical Exam Updated Vital Signs BP (!) 142/88   Pulse 84   Temp 98.1 F (36.7 C) (Oral)   Resp 16   SpO2 100%   Physical Exam Vitals and nursing note reviewed.  Constitutional:      General: She is not in acute distress.    Comments: Well-appearing slightly anxious female.  HENT:     Head: Normocephalic and atraumatic.     Nose: Nose normal.  Eyes:     General: No scleral icterus. Cardiovascular:     Rate and Rhythm: Normal rate and regular rhythm.     Pulses: Normal pulses.     Heart sounds: Normal heart sounds.     Comments: No tachycardia.  Heart rate is 86 on my exam Pulmonary:     Effort: Pulmonary effort is normal. No respiratory distress.     Breath sounds: No wheezing.  Abdominal:     Palpations: Abdomen is soft.     Tenderness: There is no abdominal tenderness.     Comments: Very thin body habitus, nontender abdomen with no guarding or rebound.  No CVA tenderness.  Musculoskeletal:     Cervical back: Normal range of motion.     Right lower leg: No edema.       Left lower leg: No edema.  Skin:    General: Skin is warm and dry.     Capillary Refill: Capillary refill takes less than 2 seconds.  Neurological:     Mental Status: She is alert. Mental status is at baseline.  Psychiatric:        Mood and Affect: Mood normal.        Behavior: Behavior normal.  ED Results / Procedures / Treatments   Labs (all labs ordered are listed, but only abnormal results are displayed) Labs Reviewed  URINALYSIS, ROUTINE W REFLEX MICROSCOPIC - Abnormal; Notable for the following components:      Result Value   Color, Urine AMBER (*)    Bilirubin Urine SMALL (*)    All other components within normal limits  PREGNANCY, URINE    EKG None  Radiology No results found.  Procedures Procedures (including critical care time)  Medications Ordered in ED Medications - No data to display  ED Course  I have reviewed the triage vital signs and the nursing notes.  Pertinent labs & imaging results that were available during my care of the patient were reviewed by me and considered in my medical decision making (see chart for details).    MDM Rules/Calculators/A&P                          Patient is a 44 year old female with a history of Graves' disease presented today with concerns for urinary tract infection.  She is complaining of some lower abdominal pain but she also states that she has diarrhea.  She has been on methimazole but discontinued this herself because of side effects of the medication.  She is declining any further testing, imaging, lab work at this time and states that she would only like to have her urine checked.  I discussed with patient that I am unable to fully assess the patient without blood work as this may be helpful in localizing any pathology other than UTI.  She states she understands this and would still like to proceed without blood work at this time.  Urinalysis without any evidence of infection.  Preg test is negative.   Patient informed of the results of her urinalysis and preg test.   The medical records were personally reviewed by myself. I personally reviewed all lab results and interpreted all imaging studies and either concurred with their official read or contacted radiology for clarification. Additional history obtained from old records.  This patient appears reasonably screened and I doubt any other medical condition requiring further workup, evaluation, or treatment in the ED at this time prior to discharge.   Patient's vitals are WNL apart from vital sign abnormalities discussed above, patient is in NAD, and able to ambulate in the ED at their baseline and able to tolerate PO.  Pain has been managed or a plan has been made for home management and has no complaints prior to discharge. Patient is comfortable with above plan and for discharge at this time. All questions were answered prior to disposition. Results from the ER workup discussed with the patient face to face and all questions answered to the best of my ability. The patient is safe for discharge with strict return precautions. Patient appears safe for discharge with appropriate follow-up. Conveyed my impression with the patient and they voiced understanding and are agreeable to plan.   An After Visit Summary was printed and given to the patient.  Portions of this note were generated with [employer]. Dictation errors may occur despite best attempts at proofreading.    Final Clinical Impression(s) / ED Diagnoses Final diagnoses:  Urinary frequency    Rx / DC Orders ED Discharge Orders    None       Tedd Sias, [place] 10/07/19 Yevette Edwards    Veryl Speak, MD 10/07/19 [phone]

## 2019-10-06 NOTE — Discharge Instructions (Addendum)
Please follow-up with your OB/GYN for any issues with your fibroids such as those that we discussed today.  Please follow-up with your endocrinologist to discuss your thyroid medications.  Please return to ED for fevers, palpitations, chest pain, shortness of breath or any other new or concerning symptoms.  As we discussed.

## 2019-10-06 NOTE — ED Triage Notes (Signed)
Patient here from home reporting lower abd pain below belly button with nausea x1 week. Hx of Graves disease and HTN.

## 2019-10-06 NOTE — ED Notes (Signed)
Told pt we needed a urine sample and asked if she is able to go to the bathroom.  Pt said if I giver her a few seconds she will go.  Pt was putting on her wig.

## 2019-10-11 ENCOUNTER — Encounter (HOSPITAL_COMMUNITY): Payer: Self-pay

## 2019-10-11 ENCOUNTER — Inpatient Hospital Stay (HOSPITAL_COMMUNITY)
Admission: EM | Admit: 2019-10-11 | Discharge: 2019-10-22 | DRG: 443 | Disposition: A | Discharge status: Home / Self Care | Payer: Medicaid - Out of State | Attending: Internal Medicine | Admitting: Internal Medicine

## 2019-10-11 ENCOUNTER — Emergency Department (HOSPITAL_COMMUNITY): Payer: Medicaid - Out of State

## 2019-10-11 ENCOUNTER — Other Ambulatory Visit: Payer: Self-pay

## 2019-10-11 DIAGNOSIS — R748 Abnormal levels of other serum enzymes: Secondary | ICD-10-CM | POA: Diagnosis present

## 2019-10-11 DIAGNOSIS — Z9229 Personal history of other drug therapy: Secondary | ICD-10-CM

## 2019-10-11 DIAGNOSIS — R7401 Elevation of levels of liver transaminase levels: Secondary | ICD-10-CM

## 2019-10-11 DIAGNOSIS — Z20822 Contact with and (suspected) exposure to covid-19: Secondary | ICD-10-CM | POA: Diagnosis present

## 2019-10-11 DIAGNOSIS — I10 Essential (primary) hypertension: Secondary | ICD-10-CM | POA: Diagnosis present

## 2019-10-11 DIAGNOSIS — F17210 Nicotine dependence, cigarettes, uncomplicated: Secondary | ICD-10-CM | POA: Diagnosis present

## 2019-10-11 DIAGNOSIS — Z79899 Other long term (current) drug therapy: Secondary | ICD-10-CM

## 2019-10-11 DIAGNOSIS — L299 Pruritus, unspecified: Secondary | ICD-10-CM | POA: Diagnosis present

## 2019-10-11 DIAGNOSIS — D509 Iron deficiency anemia, unspecified: Secondary | ICD-10-CM | POA: Diagnosis present

## 2019-10-11 DIAGNOSIS — R7989 Other specified abnormal findings of blood chemistry: Secondary | ICD-10-CM

## 2019-10-11 DIAGNOSIS — T382X5A Adverse effect of antithyroid drugs, initial encounter: Secondary | ICD-10-CM | POA: Diagnosis present

## 2019-10-11 DIAGNOSIS — E0500 Thyrotoxicosis with diffuse goiter without thyrotoxic crisis or storm: Secondary | ICD-10-CM | POA: Diagnosis present

## 2019-10-11 DIAGNOSIS — R17 Unspecified jaundice: Principal | ICD-10-CM | POA: Diagnosis present

## 2019-10-11 LAB — COMPREHENSIVE METABOLIC PANEL
ALT: 883 U/L — ABNORMAL HIGH (ref 0–44)
AST: 1467 U/L — ABNORMAL HIGH (ref 15–41)
Albumin: 3.1 g/dL — ABNORMAL LOW (ref 3.5–5.0)
Alkaline Phosphatase: 200 U/L — ABNORMAL HIGH (ref 38–126)
Anion gap: 8 (ref 5–15)
BUN: 9 mg/dL (ref 6–20)
CO2: 22 mmol/L (ref 22–32)
Calcium: 9.4 mg/dL (ref 8.9–10.3)
Chloride: 104 mmol/L (ref 98–111)
Creatinine, Ser: 0.51 mg/dL (ref 0.44–1.00)
GFR calc Af Amer: >60 mL/min (ref >60)
GFR calc non Af Amer: >60 mL/min (ref >60)
Glucose, Bld: 117 mg/dL — ABNORMAL HIGH (ref 70–99)
Potassium: 3.9 mmol/L (ref 3.5–5.1)
Sodium: 134 mmol/L — ABNORMAL LOW (ref 135–145)
Total Bilirubin: 9.5 mg/dL — ABNORMAL HIGH (ref 0.3–1.2)
Total Protein: 8.2 g/dL — ABNORMAL HIGH (ref 6.5–8.1)

## 2019-10-11 LAB — CBC
HCT: 37.5 % (ref 36.0–46.0)
Hemoglobin: 13.3 g/dL (ref 12.0–15.0)
MCH: 25.4 pg — ABNORMAL LOW (ref 26.0–34.0)
MCHC: 35.5 g/dL (ref 30.0–36.0)
MCV: 71.6 fL — ABNORMAL LOW (ref 80.0–100.0)
Platelets: 356 10*3/uL (ref 150–400)
RBC: 5.24 MIL/uL — ABNORMAL HIGH (ref 3.87–5.11)
RDW: 16.2 % — ABNORMAL HIGH (ref 11.5–15.5)
WBC: 6.6 10*3/uL (ref 4.0–10.5)
nRBC: 0 % (ref 0.0–0.2)

## 2019-10-11 LAB — URINALYSIS, ROUTINE W REFLEX MICROSCOPIC
Glucose, UA: 50 mg/dL — AB
Hgb urine dipstick: NEGATIVE
Ketones, ur: NEGATIVE mg/dL
Leukocytes,Ua: NEGATIVE
Nitrite: NEGATIVE
Protein, ur: NEGATIVE mg/dL
Specific Gravity, Urine: 1.024 (ref 1.005–1.030)
pH: 5 (ref 5.0–8.0)

## 2019-10-11 LAB — I-STAT BETA HCG BLOOD, ED (MC, WL, AP ONLY): I-stat hCG, quantitative: <5 m[IU]/mL (ref <5)

## 2019-10-11 LAB — LIPASE, BLOOD: Lipase: 22 U/L (ref 11–51)

## 2019-10-11 MED ORDER — SODIUM CHLORIDE 0.9% FLUSH
3.0000 mL | Freq: Once | INTRAVENOUS | Status: AC
  Administered 2019-10-17: 3 mL via INTRAVENOUS

## 2019-10-11 MED ORDER — LACTATED RINGERS IV BOLUS
1000.0000 mL | Freq: Once | INTRAVENOUS | Status: AC
  Administered 2019-10-11: 1000 mL via INTRAVENOUS

## 2019-10-11 MED ORDER — GABAPENTIN 100 MG PO CAPS
100.0000 mg | ORAL_CAPSULE | Freq: Three times a day (TID) | ORAL | Status: DC
  Administered 2019-10-11 – 2019-10-20 (×19): 100 mg via ORAL
  Filled 2019-10-11 (×24): qty 1

## 2019-10-11 NOTE — ED Triage Notes (Signed)
Patient seen at [facility] ED last week for abdominal pain and diarrhea and discoloration to urine. Patient states that her abdominal pain is improving but having ongoing diarrhea. Patient thinks may be related to meds she is taking for graves disease. Alert and oriented, NAD

## 2019-10-11 NOTE — ED Notes (Signed)
Pt pushing call bell repeatedly while this tech was in the room. Pt was asked multiple times for a nurse and it was explained to the pt that I will let the nurse know as soon as I finish. Pt then pushed the call bell again while this tech was still in the room. Delsa Sale, RN aware.

## 2019-10-11 NOTE — ED Notes (Signed)
PT refused CT for [place]. PT counseled, MD [place] notified. PT also does not want TSH labs drawn states, " My endocrinologist took blood a few days ago and see these doctors could see it". PT again counseled.

## 2019-10-11 NOTE — ED Notes (Signed)
Attempted to obtain blood, however pt refused until she get her fluids. Pt encouraged by both this tech and nurse, but pt was insistent.

## 2019-10-11 NOTE — ED Provider Notes (Signed)
[facility] EMERGENCY DEPARTMENT Provider Note   CSN: [id number] Arrival date & time: 10/11/19  1211     History Chief Complaint  Patient presents with  . Diarrhea    Mary Frey is a 44 y.o. female.  Patient presented to the ED with 2.5 weeks of diarrhea, patient seen by endocrinology and started back on methimazole for her Graves' disease/hyperthyroid.  Patient called her endocrinologist and he stopped her methimazole on Saturday.  Patient endorses some lower abdominal pain but states this is her usual pain with her menstrual cycle which is ongoing at this time.  Patient denies any vaginal discharge, vaginal pain or any sexual activity.  Patient endorses some dysuria/itching, recent urinalysis was negative for infection.  Patient does not endorse significant generalized pruritus of her skin.  The history is provided by the patient and medical records.  Illness Location:  GI Quality:  Diarrhea Severity:  Moderate Onset quality:  Gradual Timing:  Constant Progression:  Unchanged Chronicity:  New Context:  Recently started methimazole, feels she is having recurrence of her hyperthyroid syndrome Relieved by:  Nothing Worsened by:  Nothing Ineffective treatments:  None tried Associated symptoms: abdominal pain and diarrhea   Associated symptoms: no fever, no headaches, no loss of consciousness, no nausea, no shortness of breath and no vomiting        Past Medical History:  Diagnosis Date  . Anemia   . Chronic back pain   . Fibroids   . Graves disease   . Hypertension   . Pregnancy induced hypertension     Patient Active Problem List   Diagnosis Date Noted  . Other pancytopenia ([place]) 06/22/2019  . Cobalamin deficiency 06/22/2019  . Cocaine abuse ([place]) 01/12/2013  . Protein-calorie malnutrition, severe ([place]) 01/12/2013  . Graves disease 01/12/2013  . Acute systolic CHF (congestive heart failure) ([place]) 01/12/2013  . HTN (hypertension) 01/12/2013   . Microcytic anemia 01/12/2013  . Tobacco use disorder 01/12/2013    Past Surgical History:  Procedure Laterality Date  . NO PAST SURGERIES       OB History    Gravida  5   Para  3   Term  3   Preterm      AB  2   Living  3     SAB      TAB  1   Ectopic  1   Multiple      Live Births              Family History  Problem Relation Age of Onset  . Diabetes Maternal Aunt   . Diabetes Maternal Uncle   . Diabetes Maternal Grandmother   . Diabetes Maternal Grandfather   . Stroke Paternal Grandfather   . Breast cancer Mother     Social History   Tobacco Use  . Smoking status: Light Tobacco Smoker    Years: 14.00    Types: Cigarettes  . Smokeless tobacco: Never Used  Substance Use Topics  . Alcohol use: Yes    Alcohol/week: 0.0 standard drinks    Comment: on occasion  . Drug use: No    Types: Cocaine    Home Medications Prior to Admission medications   Medication Sig Start Date End Date Taking? Authorizing Provider  ferrous sulfate 325 (65 FE) MG EC tablet Take 325 mg by mouth daily with breakfast.   Yes [provider]  hydrochlorothiazide (HYDRODIURIL) 12.5 MG tablet Take 12.5 mg by mouth daily.   Yes [provider]  hydrOXYzine (ATARAX/VISTARIL) 25 MG tablet Take 25 mg by mouth every 6 (six) hours as needed for anxiety or itching.    Yes [provider]  melatonin 5 MG TABS Take 5 mg by mouth at bedtime.   Yes [provider]  traZODone (DESYREL) 50 MG tablet Take 50 mg by mouth at bedtime.   Yes [provider]  methimazole (TAPAZOLE) 10 MG tablet Take 10 mg in the morning and 5 mg in the evening. Patient not taking: Reported on 06/21/2019 06/28/15   Antonietta Breach, PA-C  Norethindrone Acetate-Ethinyl Estrad-FE (LOESTRIN 24 FE) 1-20 MG-MCG(24) tablet Take 1 tablet by mouth daily. Patient not taking: Reported on 10/11/2019 07/21/19   Woodroe Mode, MD  Prenatal Vit-Fe Fumarate-FA (PRENATAL COMPLETE) 14-0.4  MG TABS Take 1 tablet by mouth daily. Patient not taking: Reported on 10/11/2019 07/21/19   Woodroe Mode, MD  norethindrone (MICRONOR,CAMILA,ERRIN) 0.35 MG tablet Take 1 tablet (0.35 mg total) by mouth daily. Patient not taking: Reported on 06/27/2015 03/30/14 03/30/19  Lahoma Crocker, MD  propranolol (INDERAL) 20 MG tablet Take 1 tablet (20 mg total) by mouth 2 (two) times daily. Patient not taking: Reported on 06/27/2015 08/12/13 03/30/19  Philemon Kingdom, MD    Allergies    Hydrocodone and Latex  Review of Systems   Review of Systems  Constitutional: Negative for fever.  Respiratory: Negative for shortness of breath.   Gastrointestinal: Positive for abdominal pain and diarrhea. Negative for nausea and vomiting.  Genitourinary: Positive for dysuria. Negative for difficulty urinating, flank pain, frequency, menstrual problem, pelvic pain and urgency.  Musculoskeletal: Negative for back pain.  Skin: Positive for color change.  Neurological: Negative for loss of consciousness and headaches.  Psychiatric/Behavioral: Negative for agitation.  All other systems reviewed and are negative.   Physical Exam Updated Vital Signs BP (!) 134/115 (BP Location: Right Arm)   Pulse 73   Temp 98 F (36.7 C) (Oral)   Resp 18   SpO2 100%   Physical Exam Vitals and nursing note reviewed.  Constitutional:      General: She is not in acute distress.    Appearance: She is well-developed.  HENT:     Head: Normocephalic and atraumatic.     Right Ear: External ear normal.     Left Ear: External ear normal.     Mouth/Throat:     Mouth: Mucous membranes are moist.     Pharynx: Oropharynx is clear.  Eyes:     General: Scleral icterus present.     Extraocular Movements: Extraocular movements intact.     Conjunctiva/sclera: Conjunctivae normal.  Cardiovascular:     Rate and Rhythm: Regular rhythm. Tachycardia present.     Heart sounds: No murmur heard.   Pulmonary:     Effort: Pulmonary effort  is normal. No respiratory distress.     Breath sounds: Normal breath sounds.  Abdominal:     Palpations: Abdomen is soft.     Tenderness: There is no abdominal tenderness.  Musculoskeletal:        General: Normal range of motion.     Cervical back: Neck supple.  Skin:    General: Skin is warm and dry.  Neurological:     General: No focal deficit present.     Mental Status: She is alert and oriented to person, place, and time.  Psychiatric:        Mood and Affect: Mood normal.        Behavior: Behavior normal.  ED Results / Procedures / Treatments   Labs (all labs ordered are listed, but only abnormal results are displayed) Labs Reviewed  COMPREHENSIVE METABOLIC PANEL - Abnormal; Notable for the following components:      Result Value   Sodium 134 (*)    Glucose, Bld 117 (*)    Total Protein 8.2 (*)    Albumin 3.1 (*)    AST 1,467 (*)    ALT 883 (*)    Alkaline Phosphatase 200 (*)    Total Bilirubin 9.5 (*)    All other components within normal limits  CBC - Abnormal; Notable for the following components:   RBC 5.24 (*)    MCV 71.6 (*)    MCH 25.4 (*)    RDW 16.2 (*)    All other components within normal limits  URINALYSIS, ROUTINE W REFLEX MICROSCOPIC - Abnormal; Notable for the following components:   Color, Urine AMBER (*)    APPearance HAZY (*)    Glucose, UA 50 (*)    Bilirubin Urine MODERATE (*)    All other components within normal limits  LIPASE, BLOOD  HEPATITIS PANEL, ACUTE  I-STAT BETA HCG BLOOD, ED ([facility], [facility], [facility] ONLY)    EKG None  Radiology US Abdomen Limited RUQ  Result Date: 10/11/2019 CLINICAL DATA:  Elevated liver function EXAM: ULTRASOUND ABDOMEN LIMITED RIGHT UPPER QUADRANT COMPARISON:  None. FINDINGS: Gallbladder: No gallstones or wall thickening visualized. No sonographic Murphy sign noted by sonographer. Common bile duct: Diameter: 4.2 mm Liver: No focal lesion identified. Within normal limits in parenchymal echogenicity. Portal vein is  patent on color Doppler imaging with normal direction of blood flow towards the liver. Other: None. IMPRESSION: Negative right upper quadrant abdominal ultrasound Electronically Signed   By: Donavan Foil M.D.   On: 10/11/2019 23:15    Procedures Procedures (including critical care time)  Medications Ordered in ED Medications  sodium chloride flush (NS) 0.9 % injection 3 mL (has no administration in time range)  gabapentin (NEURONTIN) capsule 100 mg (100 mg Oral Given 10/11/19 1945)  lactated ringers bolus 1,000 mL (1,000 mLs Intravenous New Bag/Given 10/11/19 1944)    ED Course  I have reviewed the triage vital signs and the nursing notes.  Pertinent labs & imaging results that were available during my care of the patient were reviewed by me and considered in my medical decision making (see chart for details).  Clinical Course as of Oct 11 20  Mon Oct 11, 2019  2104 Total Bilirubin(!): 9.5 [AL]    Clinical Course User Index [AL] Mary Post, MD   MDM Rules/Calculators/A&P                          Differential diagnosis: Acute hepatitis, gallbladder disease, liver disease, elevated range labs, medication side effect, diarrhea, dehydration, electrolyte normality  ED physician interpretation of imaging: Right upper quadrant ultrasound without gallbladder wall thickening, stones, CBD widening. Livers homogeneous. ED physician interpretation of labs: Significant elevation of liver enzymes from labs 3 weeks ago, significant elevation of bilirubin. UA without signs of overt infection, nitrite negative, no leukocyte esterase. Urine color change likely due to elevated bilirubin. Acute hepatitis panel ordered and pending. Patient states all of her thyroid labs were fine at her last visit refused additional thyroid testing.  MDM: Patient is a 44 year old female presented ED with 2 and half weeks of diarrhea after starting methimazole for Graves' disease/hyperthyroid, stopped the medication  on Saturday labs today  are concerning for acute liver injury with unremarkable right upper quadrant ultrasound requiring hospitalization for further evaluation of liver enzymes and bilirubin.  Patient vital signs are stable, patient mild tachycardia on arrival. This improved with IV fluids.  Patient's pruritus resolved gabapentin. (Previous treatment at home with Benadryl and hydroxyzine unhelpful)  Discussed need for CT scan with patient but she refused and would prefer ultrasound of her right upper quadrant. This was obtained without concerning findings.  Hospitalist discussed with patient for monitoring liver enzymes and bilirubin.  Diagnosis, treatment and plan of care was discussed and agreed upon with patient.  Patient comfortable with admission at this time.   Final Clinical Impression(s) / ED Diagnoses Final diagnoses:  Elevated bilirubin  Elevated liver enzymes    Rx / DC Orders ED Discharge Orders    None       Mary Post, MD 10/12/19 [place]    Isla Pence, MD 10/12/19 1651

## 2019-10-12 DIAGNOSIS — R7989 Other specified abnormal findings of blood chemistry: Secondary | ICD-10-CM

## 2019-10-12 DIAGNOSIS — R945 Abnormal results of liver function studies: Secondary | ICD-10-CM

## 2019-10-12 DIAGNOSIS — R748 Abnormal levels of other serum enzymes: Secondary | ICD-10-CM | POA: Diagnosis present

## 2019-10-12 DIAGNOSIS — Z9229 Personal history of other drug therapy: Secondary | ICD-10-CM

## 2019-10-12 LAB — CBC WITH DIFFERENTIAL/PLATELET
Abs Immature Granulocytes: 0.02 10*3/uL (ref 0.00–0.07)
Basophils Absolute: 0 10*3/uL (ref 0.0–0.1)
Basophils Relative: 0 %
Eosinophils Absolute: 0.2 10*3/uL (ref 0.0–0.5)
Eosinophils Relative: 3 %
HCT: 32.3 % — ABNORMAL LOW (ref 36.0–46.0)
Hemoglobin: 11.5 g/dL — ABNORMAL LOW (ref 12.0–15.0)
Immature Granulocytes: 0 %
Lymphocytes Relative: 21 %
Lymphs Abs: 1.5 10*3/uL (ref 0.7–4.0)
MCH: 25.2 pg — ABNORMAL LOW (ref 26.0–34.0)
MCHC: 35.6 g/dL (ref 30.0–36.0)
MCV: 70.7 fL — ABNORMAL LOW (ref 80.0–100.0)
Monocytes Absolute: 0.9 10*3/uL (ref 0.1–1.0)
Monocytes Relative: 13 %
Neutro Abs: 4.5 10*3/uL (ref 1.7–7.7)
Neutrophils Relative %: 63 %
Platelets: 300 10*3/uL (ref 150–400)
RBC: 4.57 MIL/uL (ref 3.87–5.11)
RDW: 16.2 % — ABNORMAL HIGH (ref 11.5–15.5)
WBC: 7.1 10*3/uL (ref 4.0–10.5)
nRBC: 0 % (ref 0.0–0.2)

## 2019-10-12 LAB — COMPREHENSIVE METABOLIC PANEL
ALT: 748 U/L — ABNORMAL HIGH (ref 0–44)
AST: 1151 U/L — ABNORMAL HIGH (ref 15–41)
Albumin: 2.6 g/dL — ABNORMAL LOW (ref 3.5–5.0)
Alkaline Phosphatase: 173 U/L — ABNORMAL HIGH (ref 38–126)
Anion gap: 8 (ref 5–15)
BUN: 8 mg/dL (ref 6–20)
CO2: 23 mmol/L (ref 22–32)
Calcium: 9 mg/dL (ref 8.9–10.3)
Chloride: 107 mmol/L (ref 98–111)
Creatinine, Ser: 0.54 mg/dL (ref 0.44–1.00)
GFR calc Af Amer: >60 mL/min (ref >60)
GFR calc non Af Amer: >60 mL/min (ref >60)
Glucose, Bld: 114 mg/dL — ABNORMAL HIGH (ref 70–99)
Potassium: 4.1 mmol/L (ref 3.5–5.1)
Sodium: 138 mmol/L (ref 135–145)
Total Bilirubin: 7.9 mg/dL — ABNORMAL HIGH (ref 0.3–1.2)
Total Protein: 6.9 g/dL (ref 6.5–8.1)

## 2019-10-12 LAB — SARS CORONAVIRUS 2 BY RT PCR (HOSPITAL ORDER, PERFORMED IN ~~LOC~~ HOSPITAL LAB): SARS Coronavirus 2: NEGATIVE

## 2019-10-12 LAB — IRON AND TIBC
Iron: 99 ug/dL (ref 28–170)
Saturation Ratios: 33 % — ABNORMAL HIGH (ref 10.4–31.8)
TIBC: 301 ug/dL (ref 250–450)
UIBC: 202 ug/dL

## 2019-10-12 LAB — T4, FREE: Free T4: 2.87 ng/dL — ABNORMAL HIGH (ref 0.61–1.12)

## 2019-10-12 LAB — HEPATITIS PANEL, ACUTE
HCV Ab: NONREACTIVE
Hep A IgM: NONREACTIVE
Hep B C IgM: NONREACTIVE
Hepatitis B Surface Ag: NONREACTIVE

## 2019-10-12 LAB — FERRITIN: Ferritin: 348 ng/mL — ABNORMAL HIGH (ref 11–307)

## 2019-10-12 LAB — PROTIME-INR
INR: 1 (ref 0.8–1.2)
Prothrombin Time: 13 seconds (ref 11.4–15.2)

## 2019-10-12 LAB — TSH: TSH: <0.01 u[IU]/mL — ABNORMAL LOW (ref 0.350–4.500)

## 2019-10-12 MED ORDER — DIPHENHYDRAMINE HCL 25 MG PO CAPS
25.0000 mg | ORAL_CAPSULE | Freq: Three times a day (TID) | ORAL | Status: DC | PRN
  Administered 2019-10-12 – 2019-10-13 (×3): 25 mg via ORAL
  Filled 2019-10-12 (×4): qty 1

## 2019-10-12 MED ORDER — ONDANSETRON HCL 4 MG/2ML IJ SOLN
4.0000 mg | Freq: Four times a day (QID) | INTRAMUSCULAR | Status: DC | PRN
  Filled 2019-10-12: qty 2

## 2019-10-12 MED ORDER — ENOXAPARIN SODIUM 40 MG/0.4ML ~~LOC~~ SOLN
40.0000 mg | SUBCUTANEOUS | Status: DC
  Filled 2019-10-12 (×5): qty 0.4

## 2019-10-12 MED ORDER — IBUPROFEN 200 MG PO TABS
400.0000 mg | ORAL_TABLET | Freq: Four times a day (QID) | ORAL | Status: DC | PRN
  Administered 2019-10-12 – 2019-10-22 (×16): 400 mg via ORAL
  Filled 2019-10-12 (×6): qty 2
  Filled 2019-10-12: qty 1
  Filled 2019-10-12 (×10): qty 2

## 2019-10-12 MED ORDER — SODIUM CHLORIDE 0.9 % IV SOLN: INTRAVENOUS | Status: DC

## 2019-10-12 MED ORDER — ONDANSETRON HCL 4 MG PO TABS
4.0000 mg | ORAL_TABLET | Freq: Four times a day (QID) | ORAL | Status: DC | PRN
  Administered 2019-10-13: 4 mg via ORAL
  Filled 2019-10-12: qty 1

## 2019-10-12 NOTE — Consult Note (Signed)
Referring Provider: [facility] Primary Care Physician:  Mora Bellman, MD Primary Gastroenterologist:  Althia Forts  Reason for Consultation:  Transaminitis  HPI: Mary Frey is a 44 y.o. female with Graves' disease with recent increase in methimazole presenting with transmaminitis.  Patient was on 5 mg Methimazole BID from 2014 to approximately 12/2018.  From approximately 12/2018 to 03/2019, she was on 10 mg BID.  She discontinued the medication on her own due to side effects (fatigue, weight gain).  At the time, she was euthyroid.  However, over the last couple of months, she started having weight loss, fatigue, and palpitations and her thyroid was found to be hyperactive, so methimazole was initiated at 10mg  BID on 09/15/19.  There is concern her transaminitis could be from methimazole hepatotoxicity.  Patient reports fatigue and weight loss over the last several weeks.  She also reports itchiness today.  She states she has a good appetite but continues to lose weight.  She had some lower abdominal cramping, which she believes is related to her stool cycle.  However, she is not having any abdominal pain today.  She had some nausea with one episode of nonbloody, nonbilious emesis yesterday but denies any nausea or vomiting today.  She also reports diarrhea for the last 3 weeks but states that as of yesterday, her stools are firming up.  She denies any melena or hematochezia.  She denies any dysphagia, heartburn, and constipation.  Patient states she rarely drinks alcohol.  She last consumed alcohol 3 or 4 weeks ago.  She takes naproxen as needed, mostly for menstrual cramping.  She states she takes approximately 2 doses per month.  She denies any Tylenol use.  Denies any anticoagulation use.  She denies any family history of liver disease.  She has never had an EGD or colonoscopy.  She denies any family history of colon cancer or gastrointestinal malignancies.  Past Medical History:   Diagnosis Date   Anemia    Chronic back pain    Fibroids    Graves disease    Hypertension    Pregnancy induced hypertension     Past Surgical History:  Procedure Laterality Date   NO PAST SURGERIES      Prior to Admission medications   Medication Sig Start Date End Date Taking? Authorizing Provider  ferrous sulfate 325 (65 FE) MG EC tablet Take 325 mg by mouth daily with breakfast.   Yes [provider]  hydrochlorothiazide (HYDRODIURIL) 12.5 MG tablet Take 12.5 mg by mouth daily.   Yes [provider]  hydrOXYzine (ATARAX/VISTARIL) 25 MG tablet Take 25 mg by mouth every 6 (six) hours as needed for anxiety or itching.    Yes [provider]  melatonin 5 MG TABS Take 5 mg by mouth at bedtime.   Yes [provider]  traZODone (DESYREL) 50 MG tablet Take 50 mg by mouth at bedtime.   Yes [provider]  methimazole (TAPAZOLE) 10 MG tablet Take 10 mg in the morning and 5 mg in the evening. Patient not taking: Reported on 06/21/2019 06/28/15   Antonietta Breach, PA-C  Norethindrone Acetate-Ethinyl Estrad-FE (LOESTRIN 24 FE) 1-20 MG-MCG(24) tablet Take 1 tablet by mouth daily. Patient not taking: Reported on 10/11/2019 07/21/19   Woodroe Mode, MD  Prenatal Vit-Fe Fumarate-FA (PRENATAL COMPLETE) 14-0.4 MG TABS Take 1 tablet by mouth daily. Patient not taking: Reported on 10/11/2019 07/21/19   Woodroe Mode, MD  norethindrone (MICRONOR,CAMILA,ERRIN) 0.35 MG tablet Take 1 tablet (0.35 mg  total) by mouth daily. Patient not taking: Reported on 06/27/2015 03/30/14 03/30/19  Lahoma Crocker, MD  propranolol (INDERAL) 20 MG tablet Take 1 tablet (20 mg total) by mouth 2 (two) times daily. Patient not taking: Reported on 06/27/2015 08/12/13 03/30/19  Philemon Kingdom, MD    Scheduled Meds:  enoxaparin (LOVENOX) injection  40 mg Subcutaneous Q24H   gabapentin  100 mg Oral TID   sodium chloride flush  3 mL Intravenous Once   Continuous Infusions:   sodium chloride 75 mL/hr at 10/12/19 0935   PRN Meds:.diphenhydrAMINE, ibuprofen, ondansetron **OR** ondansetron (ZOFRAN) IV  Allergies as of 10/11/2019 - Review Complete 10/11/2019  Allergen Reaction Noted   Hydrocodone Nausea And Vomiting 10/01/2011   Latex Itching and Rash 10/01/2011    Family History  Problem Relation Age of Onset   Diabetes Maternal Aunt    Diabetes Maternal Uncle    Diabetes Maternal Grandmother    Diabetes Maternal Grandfather    Stroke Paternal Grandfather    Breast cancer Mother     Social History   Socioeconomic History   Marital status: Single    Spouse name: Not on file   Number of children: Not on file   Years of education: Not on file   Highest education level: Not on file  Occupational History   Not on file  Tobacco Use   Smoking status: Light Tobacco Smoker    Years: 14.00    Types: Cigarettes   Smokeless tobacco: Never Used  Substance and Sexual Activity   Alcohol use: Yes    Alcohol/week: 0.0 standard drinks    Comment: on occasion   Drug use: No    Types: Cocaine   Sexual activity: Not Currently  Other Topics Concern   Not on file  Social History Narrative   Regular exercise: walk   Caffeine use: 2 times a week   Social Determinants of Health   Financial Resource Strain:    Difficulty of Paying Living Expenses:   Food Insecurity:    Worried About Charity fundraiser in the Last Year:    Arboriculturist in the Last Year:   Transportation Needs:    Film/video editor (Medical):    Lack of Transportation (Non-Medical):   Physical Activity:    Days of Exercise per Week:    Minutes of Exercise per Session:   Stress:    Feeling of Stress :   Social Connections:    Frequency of Communication with Friends and Family:    Frequency of Social Gatherings with Friends and Family:    Attends Religious Services:    Active Member of Clubs or Organizations:    Attends Arts administrator:    Marital Status:   Intimate Partner Violence:    Fear of Current or Ex-Partner:    Emotionally Abused:    Physically Abused:    Sexually Abused:     Review of Systems: Review of Systems  Constitutional: Positive for malaise/fatigue and weight loss. Negative for chills and fever.  HENT: Negative for hearing loss and tinnitus.   Eyes: Negative for pain and redness.       Scleral icterus  Respiratory: Negative for cough and shortness of breath.   Cardiovascular: Positive for palpitations. Negative for chest pain.  Gastrointestinal: Positive for diarrhea. Negative for abdominal pain, blood in stool, constipation, heartburn, melena, nausea and vomiting.  Genitourinary: Negative for dysuria and flank pain.  Musculoskeletal: Negative for falls and joint pain.  Skin: Positive  for itching. Negative for rash.  Neurological: Negative for seizures and loss of consciousness.  Endo/Heme/Allergies: Negative for polydipsia. Does not bruise/bleed easily.  Psychiatric/Behavioral: Negative for substance abuse. The patient is not nervous/anxious.    Physical Exam: Vital signs: Vitals:   10/12/19 0904 10/12/19 0917  BP: (!) 139/94 (!) 156/81  Pulse: 74 62  Resp: 18 18  Temp: 98.7 F (37.1 C) 98 F (36.7 C)  SpO2: 100% 100%   Last BM Date: 10/11/19  Physical Exam Constitutional:      General: She is awake. She is not in acute distress.    Comments: thin  HENT:     Head: Normocephalic and atraumatic.     Nose: Nose normal.     Mouth/Throat:     Mouth: Mucous membranes are moist.     Pharynx: Oropharynx is clear.  Eyes:     General: Scleral icterus present.     Extraocular Movements: Extraocular movements intact.  Cardiovascular:     Rate and Rhythm: Normal rate and regular rhythm.     Pulses: Normal pulses.     Heart sounds: Normal heart sounds.  Pulmonary:     Effort: Pulmonary effort is normal. No respiratory distress.     Breath sounds: Normal breath sounds.   Abdominal:     General: Abdomen is flat. Bowel sounds are increased. There is no distension.     Palpations: Abdomen is soft. There is no mass.     Tenderness: There is no abdominal tenderness. There is no guarding or rebound.     Hernia: No hernia is present.  Musculoskeletal:        General: No swelling or tenderness.     Cervical back: Normal range of motion and neck supple.  Skin:    General: Skin is warm and dry.     Coloration: Skin is jaundiced.  Neurological:     General: No focal deficit present.     Mental Status: She is oriented to person, place, and time. She is lethargic.  Psychiatric:        Mood and Affect: Mood normal.        Behavior: Behavior normal. Behavior is cooperative.    GI:  Lab Results: Recent Labs    10/11/19 1851  WBC 6.6  HGB 13.3  HCT 37.5  PLT 356   BMET Recent Labs    10/11/19 1851  NA 134*  K 3.9  CL 104  CO2 22  GLUCOSE 117*  BUN 9  CREATININE 0.51  CALCIUM 9.4   LFT Recent Labs    10/11/19 1851  PROT 8.2*  ALBUMIN 3.1*  AST 1,467*  ALT 883*  ALKPHOS 200*  BILITOT 9.5*   PT/INR No results for input(s): LABPROT, INR in the last 72 hours.   Studies/Results: US Abdomen Limited RUQ  Result Date: 10/11/2019 CLINICAL DATA:  Elevated liver function EXAM: ULTRASOUND ABDOMEN LIMITED RIGHT UPPER QUADRANT COMPARISON:  None. FINDINGS: Gallbladder: No gallstones or wall thickening visualized. No sonographic Murphy sign noted by sonographer. Common bile duct: Diameter: 4.2 mm Liver: No focal lesion identified. Within normal limits in parenchymal echogenicity. Portal vein is patent on color Doppler imaging with normal direction of blood flow towards the liver. Other: None. IMPRESSION: Negative right upper quadrant abdominal ultrasound Electronically Signed   By: Donavan Foil M.D.   On: 10/11/2019 23:15    Impression: Transaminitis: unclear etiology.  Possibly related to methimazole use.  Acute hepatitis panel negative. -T bili  9.5/AST 1467/ALT 883/ALP 200  is a 6/21.  Today's labs are pending (patient did not want labs drawn, she feels dehydrated.  States she may be amenable to lab draw in an hour.) -Right upper quadrant ultrasound was unremarkable  Diarrhea x 3 weeks, likely related to Graves' disease.  Patient states symptoms are improving.    Weight loss, likely related to Graves' disease.  Plan: Additional labs to evaluate transaminitis have been ordered: ANA, ASMA, AMA, iron panel/ferritin, ceruloplasmin, alpha-1 antitrypsin, and HSV/CMV/EBV IgM.  Continue supportive care.  Continue to monitor LFTs.  [facility] will follow.   LOS: 0 days   Salley Slaughter  PA-C 10/12/2019, 10:04 AM  Contact #  [phone]

## 2019-10-12 NOTE — Progress Notes (Signed)
Received message from RN that patient declined to have labs drawn and wanted to further discuss the labs that were ordered.  I spoke with the patient and discussed the reasons we ordered ANA, ASMA, AMA, iron panel/ferritin, ceruloplasmin, alpha-1 antitrypsin, and HSV/CMV/EBV IgM.  Patient is in agreement to proceed with these labs.  Mary Frey also requested to know which labs were ordered by the primary team, which included CBC, CMP, TSH, free T4, PT/INR, creatinine, and HIV antibody.  Discussed the importance of each of these labs.  Patient states Mary Frey is willing to get all of these labs except creatinine and HIV antibody.  Mary Frey is concerned about the amount of blood that will be drawn, stating Mary Frey is dehydrated.  I informed her that there is maximum amount to be drawn per day, that each tube only contains a few cc of blood, and that her labs combined do not meet or exceed this.  However, Mary Frey still does not want to get either the HIV screen or creatinine.  I think it will be OK to do defer creatinine, as Mary Frey is also having a CMP drawn which will contain serum creatinine.  However, I messaged her hospitalist to let them know that Mary Frey does not want the HIV antibody screen, in case they would like to further discuss this with her.

## 2019-10-12 NOTE — ED Notes (Signed)
Ordered a hospital bed per rn Woody--Oza Oberle  

## 2019-10-12 NOTE — H&P (Addendum)
History and Physical    Mary Frey MPN:[id number] DOB: 10-Feb-1976 DOA: 10/11/2019  PCP: Mora Bellman, MD   Patient coming from: home  I have personally briefly reviewed patient's old medical records in [place]  Chief Complaint: diarrhea  HPI: Mary Frey is a 44 y.o. female with medical history significant for Graves' disease who presents to the emergency room with a 3-week history of diarrhea lower abdominal pain.  Patient states that she had been on methimazole 5 mg twice daily from 2014 up until December 2020 when she stopped taking the medication.  Over the course of the next 6 months she was initially doing well but then started losing weight and feeling very fatigued.  She contacted her endocrinologist 3 weeks ago who and some blood tests.  Patient states they told her her thyroid was very hyperactive so they were going to start her on a higher dose of methimazole 10 mg twice daily.  She said since starting the medication she progressively developed diarrhea and then lower abdominal pain.  She vomited once today.  She denied fever or chills.  Denied dysuria denied chest pains or shortness of breath.  The symptoms started after taking the medication, she stopped taking it 3 days ago but her discomfort continued.  Additionally she complained of dark-colored urine and pruritus ED Course: On arrival in the emergency room she was mildly tachycardic at 101 but with otherwise normal vitals.  Labs notable for markedly elevated LFTs with AST 1467, ALT 883, alk phos 200 and total bilirubin 9.5.  Compared to blood work done 3 months ago when AST was slightly elevated at 50 but with otherwise normal liver function.  Labs were mostly unremarkable.  Right upper quadrant sonogram was essentially negative.  Hospitalist consulted for admission possible methimazole hepatotoxicity.  Review of Systems: As per HPI otherwise 10 point review of systems negative.    Past Medical History:    Diagnosis Date   Anemia    Chronic back pain    Fibroids    Graves disease    Hypertension    Pregnancy induced hypertension     Past Surgical History:  Procedure Laterality Date   NO PAST SURGERIES       reports that she has been smoking cigarettes. She has smoked for the past 14.00 years. She has never used smokeless tobacco. She reports current alcohol use. She reports that she does not use drugs.  Allergies  Allergen Reactions   Hydrocodone Nausea And Vomiting   Latex Itching and Rash    Family History  Problem Relation Age of Onset   Diabetes Maternal Aunt    Diabetes Maternal Uncle    Diabetes Maternal Grandmother    Diabetes Maternal Grandfather    Stroke Paternal Grandfather    Breast cancer Mother      Prior to Admission medications   Medication Sig Start Date End Date Taking? Authorizing Provider  ferrous sulfate 325 (65 FE) MG EC tablet Take 325 mg by mouth daily with breakfast.   Yes [provider]  hydrochlorothiazide (HYDRODIURIL) 12.5 MG tablet Take 12.5 mg by mouth daily.   Yes [provider]  hydrOXYzine (ATARAX/VISTARIL) 25 MG tablet Take 25 mg by mouth every 6 (six) hours as needed for anxiety or itching.    Yes [provider]  melatonin 5 MG TABS Take 5 mg by mouth at bedtime.   Yes [provider]  traZODone (DESYREL) 50 MG tablet Take 50 mg by mouth  at bedtime.   Yes [provider]  methimazole (TAPAZOLE) 10 MG tablet Take 10 mg in the morning and 5 mg in the evening. Patient not taking: Reported on 06/21/2019 06/28/15   Antonietta Breach, PA-C  Norethindrone Acetate-Ethinyl Estrad-FE (LOESTRIN 24 FE) 1-20 MG-MCG(24) tablet Take 1 tablet by mouth daily. Patient not taking: Reported on 10/11/2019 07/21/19   Woodroe Mode, MD  Prenatal Vit-Fe Fumarate-FA (PRENATAL COMPLETE) 14-0.4 MG TABS Take 1 tablet by mouth daily. Patient not taking: Reported on 10/11/2019 07/21/19   Woodroe Mode, MD   norethindrone (MICRONOR,CAMILA,ERRIN) 0.35 MG tablet Take 1 tablet (0.35 mg total) by mouth daily. Patient not taking: Reported on 06/27/2015 03/30/14 03/30/19  Lahoma Crocker, MD  propranolol (INDERAL) 20 MG tablet Take 1 tablet (20 mg total) by mouth 2 (two) times daily. Patient not taking: Reported on 06/27/2015 08/12/13 03/30/19  Philemon Kingdom, MD    Physical Exam: Vitals:   10/11/19 1246 10/11/19 2159  BP: (!) 142/104 (!) 134/115  Pulse: (!) 102 73  Resp: 18 18  Temp: 98 F (36.7 C)   TempSrc: Oral   SpO2: 99% 100%     Vitals:   10/11/19 1246 10/11/19 2159  BP: (!) 142/104 (!) 134/115  Pulse: (!) 102 73  Resp: 18 18  Temp: 98 F (36.7 C)   TempSrc: Oral   SpO2: 99% 100%      Constitutional: Alert and oriented x 3 . Not in any apparent distress HEENT:      Head: Normocephalic and atraumatic.         Eyes: PERLA, EOMI, Conjunctivae are normal. Sclera is icteric.       Mouth/Throat: Mucous membranes are moist.       Neck: Supple with no signs of meningismus. Cardiovascular: Regular rate and rhythm. No murmurs, gallops, or rubs. 2+ symmetrical distal pulses are present . No JVD. No LE edema Respiratory: Respiratory effort normal .Lungs sounds clear bilaterally. No wheezes, crackles, or rhonchi.  Gastrointestinal: Soft, non tender, and non distended with positive bowel sounds. No rebound or guarding. Genitourinary: No CVA tenderness. Musculoskeletal: Nontender with normal range of motion in all extremities. No edema, cyanosis, or erythema of extremities. Neurologic: Normal speech and language. Face is symmetric. Moving all extremities. No gross focal neurologic deficits . Skin: Skin is warm, dry.  No rash or ulcers Psychiatric: Mood and affect are normal Speech and behavior are normal   Labs on Admission: I have personally reviewed following labs and imaging studies  CBC: Recent Labs  Lab 10/11/19 1851  WBC 6.6  HGB 13.3  HCT 37.5  MCV 71.6*  PLT 696    Basic Metabolic Panel: Recent Labs  Lab 10/11/19 1851  NA 134*  K 3.9  CL 104  CO2 22  GLUCOSE 117*  BUN 9  CREATININE 0.51  CALCIUM 9.4   GFR: CrCl cannot be calculated (Unknown ideal weight.). Liver Function Tests: Recent Labs  Lab 10/11/19 1851  AST 1,467*  ALT 883*  ALKPHOS 200*  BILITOT 9.5*  PROT 8.2*  ALBUMIN 3.1*   Recent Labs  Lab 10/11/19 1851  LIPASE 22   No results for input(s): AMMONIA in the last 168 hours. Coagulation Profile: No results for input(s): INR, PROTIME in the last 168 hours. Cardiac Enzymes: No results for input(s): CKTOTAL, CKMB, CKMBINDEX, TROPONINI in the last 168 hours. BNP (last 3 results) No results for input(s): PROBNP in the last 8760 hours. HbA1C: No results for input(s): HGBA1C in the last 72 hours.  CBG: No results for input(s): GLUCAP in the last 168 hours. Lipid Profile: No results for input(s): CHOL, HDL, LDLCALC, TRIG, CHOLHDL, LDLDIRECT in the last 72 hours. Thyroid Function Tests: No results for input(s): TSH, T4TOTAL, FREET4, T3FREE, THYROIDAB in the last 72 hours. Anemia Panel: No results for input(s): VITAMINB12, FOLATE, FERRITIN, TIBC, IRON, RETICCTPCT in the last 72 hours. Urine analysis:    Component Value Date/Time   COLORURINE AMBER (A) 10/11/2019 1845   APPEARANCEUR HAZY (A) 10/11/2019 1845   LABSPEC 1.024 10/11/2019 1845   PHURINE 5.0 10/11/2019 1845   GLUCOSEU 50 (A) 10/11/2019 1845   HGBUR NEGATIVE 10/11/2019 1845   BILIRUBINUR MODERATE (A) 10/11/2019 1845   KETONESUR NEGATIVE 10/11/2019 1845   PROTEINUR NEGATIVE 10/11/2019 1845   UROBILINOGEN 0.2 03/30/2019 1215   NITRITE NEGATIVE 10/11/2019 1845   LEUKOCYTESUR NEGATIVE 10/11/2019 1845    Radiological Exams on Admission: US Abdomen Limited RUQ  Result Date: 10/11/2019 CLINICAL DATA:  Elevated liver function EXAM: ULTRASOUND ABDOMEN LIMITED RIGHT UPPER QUADRANT COMPARISON:  None. FINDINGS: Gallbladder: No gallstones or wall thickening  visualized. No sonographic Murphy sign noted by sonographer. Common bile duct: Diameter: 4.2 mm Liver: No focal lesion identified. Within normal limits in parenchymal echogenicity. Portal vein is patent on color Doppler imaging with normal direction of blood flow towards the liver. Other: None. IMPRESSION: Negative right upper quadrant abdominal ultrasound Electronically Signed   By: Donavan Foil M.D.   On: 10/11/2019 23:15    EKG: Independently reviewed.   Assessment/Plan Principal Problem:   Abnormal LFTs in the setting of recent restart of methimazole -Patient presenting with lower abdominal discomfort and diarrhea starting after restarting methimazole at a higher dose than her usual -Possible methimazole related hepatotoxicity with differential of viral hepatitis.  Patient denies any other medication use.  No Tylenol use. -Follow hepatitis panel ordered from the ER -Avoid hepatotoxins -IV hydration -GI consult in the a.m. -Trend liver enzymes.    Graves disease -Patient was restarted on methimazole 3 weeks prior symptomatic hyperthyroidism -Follow TSH and T4 -Endocrinology consult in the a.m.     DVT prophylaxis: Lovenox  Code Status: full code  Family Communication:  none  Disposition Plan: Back to previous home environment Consults called: none  Status:obs    Athena Masse MD [facility]     10/12/2019, 12:52 AM

## 2019-10-12 NOTE — ED Notes (Signed)
PT is refusing additional blood work at this time. PT has been counseled on the necessity of blood work.

## 2019-10-12 NOTE — ED Notes (Signed)
Pt refuses to get blood drawn.

## 2019-10-12 NOTE — Plan of Care (Signed)
  Problem: Education: Goal: Knowledge of General Education information will improve Description: Including pain rating scale, medication(s)/side effects and non-pharmacologic comfort measures Outcome: Progressing   Problem: Clinical Measurements: Goal: Respiratory complications will improve Outcome: Progressing Note: On room air   Problem: Activity: Goal: Risk for activity intolerance will decrease Outcome: Progressing Note: Up independently in room, tolerating well   Problem: Nutrition: Goal: Adequate nutrition will be maintained Outcome: Progressing   Problem: Coping: Goal: Level of anxiety will decrease Outcome: Progressing   Problem: Elimination: Goal: Will not experience complications related to urinary retention Outcome: Progressing   Problem: Pain Managment: Goal: General experience of comfort will improve Outcome: Progressing Note: Treated abdominal pain twice with advil   Problem: Safety: Goal: Ability to remain free from injury will improve Outcome: Progressing

## 2019-10-12 NOTE — ED Notes (Signed)
Pt transferred to a hospital bed for comfort due to holding.

## 2019-10-12 NOTE — Progress Notes (Signed)
PT seen and examined at bedside in ED.  She was admitted earlier this am by Dr Sharlene Motts. Please see her note for detailed H&P.   The lady 44 year old lady with prior history of Graves' disease, hypertension, chronic back pain presents to ED for 3-week history of generalized abdominal pain and diarrhea. Patient reports that she has been on methimazole 5 mg twice daily from 2014 up until December 2020.  She briefly stopped taking the medications but restarted them about 3 weeks ago after speaking with her endocrinologist.  Currently she is on 10 mg of methimazole twice daily.  Since starting the medication patient reports having mild abdominal pain associated with some diarrhea, pruritus and dark-colored urine. On arrival to ED she was found to be tachycardic but with otherwise normal vitals.  Labs revealed AST of 1467, ALT of 883, alk phos of 200 and total bili of 9.5. Right upper quadrant ultrasound was essentially negative.  TRH consulted for evaluation of elevated liver enzymes and abdominal pain and possible methimazole hepatotoxicity.  She is alert and oriented and appears comfortable.  CVS s1s2, RRR, no JVD,  Lungs clear to auscultation, no wheezing or rhonchi.  Abdomen is soft , mild gen tenderness, bowel sounds wnl.  Extremities no pedal edema.     Plan:  GI consult, check for auto immune work up.  Hosie Poisson, MD

## 2019-10-13 DIAGNOSIS — R748 Abnormal levels of other serum enzymes: Secondary | ICD-10-CM | POA: Diagnosis present

## 2019-10-13 DIAGNOSIS — D509 Iron deficiency anemia, unspecified: Secondary | ICD-10-CM | POA: Diagnosis present

## 2019-10-13 DIAGNOSIS — E0500 Thyrotoxicosis with diffuse goiter without thyrotoxic crisis or storm: Secondary | ICD-10-CM | POA: Diagnosis present

## 2019-10-13 DIAGNOSIS — Z20822 Contact with and (suspected) exposure to covid-19: Secondary | ICD-10-CM | POA: Diagnosis present

## 2019-10-13 DIAGNOSIS — T382X5A Adverse effect of antithyroid drugs, initial encounter: Secondary | ICD-10-CM | POA: Diagnosis present

## 2019-10-13 DIAGNOSIS — L299 Pruritus, unspecified: Secondary | ICD-10-CM | POA: Diagnosis present

## 2019-10-13 DIAGNOSIS — R945 Abnormal results of liver function studies: Secondary | ICD-10-CM | POA: Diagnosis not present

## 2019-10-13 DIAGNOSIS — Z79899 Other long term (current) drug therapy: Secondary | ICD-10-CM | POA: Diagnosis not present

## 2019-10-13 DIAGNOSIS — R17 Unspecified jaundice: Secondary | ICD-10-CM | POA: Diagnosis not present

## 2019-10-13 DIAGNOSIS — I10 Essential (primary) hypertension: Secondary | ICD-10-CM | POA: Diagnosis present

## 2019-10-13 DIAGNOSIS — F17210 Nicotine dependence, cigarettes, uncomplicated: Secondary | ICD-10-CM | POA: Diagnosis present

## 2019-10-13 LAB — CERULOPLASMIN: Ceruloplasmin: 52.8 mg/dL — ABNORMAL HIGH (ref 19.0–39.0)

## 2019-10-13 LAB — ALPHA-1-ANTITRYPSIN: A-1 Antitrypsin, Ser: 204 mg/dL — ABNORMAL HIGH (ref 101–187)

## 2019-10-13 LAB — CMV IGM: CMV IgM: <30 AU/mL (ref 0.0–29.9)

## 2019-10-13 MED ORDER — DIPHENHYDRAMINE HCL 25 MG PO CAPS
25.0000 mg | ORAL_CAPSULE | Freq: Three times a day (TID) | ORAL | Status: DC | PRN
  Administered 2019-10-13 – 2019-10-21 (×6): 25 mg via ORAL
  Filled 2019-10-13 (×7): qty 1

## 2019-10-13 MED ORDER — HYDROXYZINE HCL 25 MG PO TABS
25.0000 mg | ORAL_TABLET | Freq: Three times a day (TID) | ORAL | Status: DC | PRN
  Administered 2019-10-13: 25 mg via ORAL
  Filled 2019-10-13: qty 1

## 2019-10-13 NOTE — Progress Notes (Addendum)
PROGRESS NOTE  Mary Frey UUE:[id number] DOB: 05/09/1975 DOA: 10/11/2019 PCP: Mora Bellman, MD  HPI/Recap of past 24 hours: HPI: Mary Frey is a 44 y.o. female with medical history significant for Graves' disease who presents to the emergency room with a 3-week history of diarrhea lower abdominal pain.  Patient states that she had been on methimazole 5 mg twice daily from 2014 up until December 2020 when she stopped taking the medication.  Over the course of the next 6 months she was initially doing well but then started losing weight and feeling very fatigued.  She contacted her endocrinologist 3 weeks ago who and some blood tests.  Patient states they told her her thyroid was very hyperactive so they were going to start her on a higher dose of methimazole 10 mg twice daily.  She said since starting the medication she progressively developed diarrhea and then lower abdominal pain.  She vomited once today.  She denied fever or chills.  Denied dysuria denied chest pains or shortness of breath.  The symptoms started after taking the medication, she stopped taking it 3 days ago but her discomfort continued.  Additionally she complained of dark-colored urine and pruritus.  ED Course: On arrival in the emergency room she was mildly tachycardic at 101 but with otherwise normal vitals.  Labs notable for markedly elevated LFTs with AST 1467, ALT 883, alk phos 200 and total bilirubin 9.5.  Compared to blood work done 3 months ago when AST was slightly elevated at 50 but with otherwise normal liver function.  Labs were mostly unremarkable.  Right upper quadrant sonogram was essentially negative.  Hospitalist consulted for admission possible methimazole hepatotoxicity.  GI following.  10/13/19: Seen and examined.  Mainly reports pruritus.  Nausea and abdominal pain are improved.   Assessment/Plan: Principal Problem:   Abnormal LFTs Active Problems:   Graves disease   HTN (hypertension)    History of use of hepatotoxic drug   Abnormal liver enzymes    Abnormal LFTs in the setting of recent restart of methimazole, suspected methimazole toxicity. -Patient presenting with lower abdominal discomfort and diarrhea starting after restarting methimazole at a higher dose than her usual -Possible methimazole related hepatotoxicity  -Acute hepatitis panel negative.   -Continue to avoid hepatotoxic agents  -Continue IV hydration -GI following. -Continue to trend liver enzymes, patient has refused lab work this morning.  Acute transaminitis Presented with alkaline phosphatase 200, AST 1400, ALT 800, T bilirubin 9.5 Suspected methimazole toxicity  Hyperbilirubinemia with suspected Graves' disease drug toxicity Presented with T bili of 9.5 Levels are trending down Continue to monitor    Graves disease -Patient was restarted on methimazole 3 weeks prior symptomatic hyperthyroidism -TSH less than 0.010, free T4 2.87. -No Endocrinology service at [facility].  Will contact patient's endocrinologist.     DVT prophylaxis: Lovenox subcu daily Code Status: full code  Family Communication:  none     Status is: Inpatient   Dispo: The patient is from: Home.              Anticipated d/c is to: Home.              Anticipated d/c date is: 10/15/2019               Patient currently currently not stable for discharge due to severe acute transaminitis with ongoing work-up.         Objective: Vitals:   10/12/19 0940 10/12/19 1933 10/13/19 0446 10/13/19 1005  BP:  (!) 147/87 135/77 132/81  Pulse:  71 97 78  Resp:  17 17 18   Temp:  97.8 F (36.6 C) 98 F (36.7 C) 98 F (36.7 C)  TempSrc:  Oral Oral Oral  SpO2:  99% 98% 100%  Weight: 54.4 kg     Height:        Intake/Output Summary (Last 24 hours) at 10/13/2019 1237 Last data filed at 10/13/2019 0600 Gross per 24 hour  Intake 3656.03 ml  Output --  Net 3656.03 ml   Filed Weights   10/12/19 0939 10/12/19 0940   Weight: 62.1 kg 54.4 kg    Exam:  . General: 44 y.o. year-old female well developed well nourished in no acute distress.  Alert and oriented x3. . Cardiovascular: Regular rate and rhythm with no rubs or gallops.  No thyromegaly or JVD noted.   Marland Kitchen Respiratory: Clear to auscultation with no wheezes or rales. Good inspiratory effort. . Abdomen: Soft left lower quadrant tenderness with palpation.  Normal bowel sounds x4 quadrants. . Musculoskeletal: No lower extremity edema. 2/4 pulses in all 4 extremities. . Skin: No ulcerative lesions noted or rashes.  Dry skin. Marland Kitchen Psychiatry: Mood is appropriate for condition and setting   Data Reviewed: CBC: Recent Labs  Lab 10/11/19 1851 10/12/19 1651  WBC 6.6 7.1  NEUTROABS  --  4.5  HGB 13.3 11.5*  HCT 37.5 32.3*  MCV 71.6* 70.7*  PLT 356 540   Basic Metabolic Panel: Recent Labs  Lab 10/11/19 1851 10/12/19 1651  NA 134* 138  K 3.9 4.1  CL 104 107  CO2 22 23  GLUCOSE 117* 114*  BUN 9 8  CREATININE 0.51 0.54  CALCIUM 9.4 9.0   GFR: Estimated Creatinine Clearance: 78.7 mL/min (by C-G formula based on SCr of 0.54 mg/dL). Liver Function Tests: Recent Labs  Lab 10/11/19 1851 10/12/19 1651  AST 1,467* 1,151*  ALT 883* 748*  ALKPHOS 200* 173*  BILITOT 9.5* 7.9*  PROT 8.2* 6.9  ALBUMIN 3.1* 2.6*   Recent Labs  Lab 10/11/19 1851  LIPASE 22   No results for input(s): AMMONIA in the last 168 hours. Coagulation Profile: Recent Labs  Lab 10/12/19 1651  INR 1.0   Cardiac Enzymes: No results for input(s): CKTOTAL, CKMB, CKMBINDEX, TROPONINI in the last 168 hours. BNP (last 3 results) No results for input(s): PROBNP in the last 8760 hours. HbA1C: No results for input(s): HGBA1C in the last 72 hours. CBG: No results for input(s): GLUCAP in the last 168 hours. Lipid Profile: No results for input(s): CHOL, HDL, LDLCALC, TRIG, CHOLHDL, LDLDIRECT in the last 72 hours. Thyroid Function Tests: Recent Labs    10/12/19 1651   TSH <0.010*  FREET4 2.87*   Anemia Panel: Recent Labs    10/12/19 1651  FERRITIN 348*  TIBC 301  IRON 99   Urine analysis:    Component Value Date/Time   COLORURINE AMBER (A) 10/11/2019 1845   APPEARANCEUR HAZY (A) 10/11/2019 1845   LABSPEC 1.024 10/11/2019 1845   PHURINE 5.0 10/11/2019 1845   GLUCOSEU 50 (A) 10/11/2019 1845   HGBUR NEGATIVE 10/11/2019 1845   BILIRUBINUR MODERATE (A) 10/11/2019 1845   KETONESUR NEGATIVE 10/11/2019 1845   PROTEINUR NEGATIVE 10/11/2019 1845   UROBILINOGEN 0.2 03/30/2019 1215   NITRITE NEGATIVE 10/11/2019 1845   LEUKOCYTESUR NEGATIVE 10/11/2019 1845   Sepsis Labs: @LABRCNTIP (procalcitonin:4,lacticidven:4)  ) Recent Results (from the past 240 hour(s))  SARS Coronavirus 2 by RT PCR (hospital order, performed in [place]  hospital lab) Nasopharyngeal Nasopharyngeal Swab     Status: None   Collection Time: 10/12/19  1:43 AM   Specimen: Nasopharyngeal Swab  Result Value Ref Range Status   SARS Coronavirus 2 NEGATIVE NEGATIVE Final    Comment: (NOTE) SARS-CoV-2 target nucleic acids are NOT DETECTED.  The SARS-CoV-2 RNA is generally detectable in upper and lower respiratory specimens during the acute phase of infection. The lowest concentration of SARS-CoV-2 viral copies this assay can detect is 250 copies / mL. A negative result does not preclude SARS-CoV-2 infection and should not be used as the sole basis for treatment or other patient management decisions.  A negative result may occur with improper specimen collection / handling, submission of specimen other than nasopharyngeal swab, presence of viral mutation(s) within the areas targeted by this assay, and inadequate number of viral copies (<250 copies / mL). A negative result must be combined with clinical observations, patient history, and epidemiological information.  Fact Sheet for Patients:   StrictlyIdeas.no  Fact Sheet for Healthcare  Providers: BankingDealers.co.za  This test is not yet approved or  cleared by the [place] FDA and has been authorized for detection and/or diagnosis of SARS-CoV-2 by FDA under an Emergency Use Authorization (EUA).  This EUA will remain in effect (meaning this test can be used) for the duration of the COVID-19 declaration under Section 564(b)(1) of the Act, 21 U.S.C. section 360bbb-3(b)(1), unless the authorization is terminated or revoked sooner.  Performed at [facility], [place] [address] [place], [place] [address]       Studies: No results found.  Scheduled Meds: . enoxaparin (LOVENOX) injection  40 mg Subcutaneous Q24H  . gabapentin  100 mg Oral TID  . sodium chloride flush  3 mL Intravenous Once    Continuous Infusions: . sodium chloride 75 mL/hr at 10/13/19 0944     LOS: 0 days     Kayleen Memos, MD [facility] Pager [phone]  If 7PM-7AM, please contact night-coverage [email or web address] Password [facility] 10/13/2019, 12:37 PM

## 2019-10-13 NOTE — Progress Notes (Signed)
Called to pt room - she is requesting peroxide to clean her IV site.  States "my IV came out last night".    IV cannula is lying on top of pump, large puddle in the floor.  Pt states she does not want a new IV placed now, she wants to wait until day shift.  I let her know I could have IV Team place the IV and explained why an IV is needed.  Pt states the reason she doesn't want the IV is because she wants to take a shower, the benadryl did not work and she is still itching.  She wants to shower with peroxide to stop the itching.    APP notified.  Shower order denied.  Vistaril ordered for itching.

## 2019-10-13 NOTE — Progress Notes (Signed)
[facility] Gastroenterology Progress Note  Mary Frey 44 y.o. 01-23-1976  CC:  transaminitis   Subjective: Reports feeling better today than yesterday.  Denies any abdominal pain, nausea, and vomiting.  Had a loose stool last night but had a formed bowel movement today.  ROS : Review of Systems  Cardiovascular: Negative for chest pain and palpitations.  Gastrointestinal: Positive for diarrhea (intermittent). Negative for abdominal pain, blood in stool, constipation, heartburn, melena, nausea and vomiting.   Objective: Vital signs in last 24 hours: Vitals:   10/13/19 0446 10/13/19 1005  BP: 135/77 132/81  Pulse: 97 78  Resp: 17 18  Temp: 98 F (36.7 C) 98 F (36.7 C)  SpO2: 98% 100%    Physical Exam:  General:  Alert, oriented, no acute distress  Head:  Normocephalic, without obvious abnormality, atraumatic  Eyes:  Scleral icterus, EOMs intact  Lungs:   Clear to auscultation bilaterally, respirations unlabored  Heart:  Regular rate and rhythm, S1/S2 normal  Abdomen:   Soft, non-tender, non-distended, normoactive bowel sounds, no guarding or peritoneal signs  Extremities: Extremities normal, atraumatic, no  edema  Pulses: 2+ and symmetric    Lab Results: Recent Labs    10/11/19 1851 10/12/19 1651  NA 134* 138  K 3.9 4.1  CL 104 107  CO2 22 23  GLUCOSE 117* 114*  BUN 9 8  CREATININE 0.51 0.54  CALCIUM 9.4 9.0   Recent Labs    10/11/19 1851 10/12/19 1651  AST 1,467* 1,151*  ALT 883* 748*  ALKPHOS 200* 173*  BILITOT 9.5* 7.9*  PROT 8.2* 6.9  ALBUMIN 3.1* 2.6*   Recent Labs    10/11/19 1851 10/12/19 1651  WBC 6.6 7.1  NEUTROABS  --  4.5  HGB 13.3 11.5*  HCT 37.5 32.3*  MCV 71.6* 70.7*  PLT 356 300   Recent Labs    10/12/19 1651  LABPROT 13.0  INR 1.0    Impression: Transaminitis: unclear etiology.  Possibly related to methimazole use vs. Uncontrolled hyperthyroidism. -LFTs still elevated but are trending down. Yesterday, T. Bili 7.9/ AST  1151/ ALT 748/ ALP 173 as compared to 6/21T bili 9.5/ AST 1467/ ALT 883/ ALP 200   -Acute hepatitis panel negative. -CMV IgM negative; EBV and HSV pending -No deficiencies of alpha-1-antitrypsin and ceruloplasmin -ASMA, AMA, and ANA pending -Right upper quadrant ultrasound was unremarkable  Diarrhea x 3 weeks, likely related to Graves' disease.  Patient states symptoms are improving.    Weight loss, likely related to Graves' disease.  Plan: Await additional labs.  If remaining hepatic labs are negative, suspect this is related to methimazole vs. uncontrolled hyperthyroidism.  Continue to hold methimazole.  Patient will need to see endocrinology to discuss further options for management of hyperthyroidism.  Recommend endocrinology consultation.  Continue supportive care.  Continue to monitor LFTs.  We will follow at a distance.  Salley Slaughter PA-C 10/13/2019, 10:44 AM  Contact #  [phone]

## 2019-10-13 NOTE — Plan of Care (Signed)

## 2019-10-13 NOTE — Progress Notes (Signed)
Pt refused am labs.

## 2019-10-14 LAB — CBC WITH DIFFERENTIAL/PLATELET
Abs Immature Granulocytes: 0.02 10*3/uL (ref 0.00–0.07)
Basophils Absolute: 0 10*3/uL (ref 0.0–0.1)
Basophils Relative: 0 %
Eosinophils Absolute: 0.4 10*3/uL (ref 0.0–0.5)
Eosinophils Relative: 5 %
HCT: 33 % — ABNORMAL LOW (ref 36.0–46.0)
Hemoglobin: 11.7 g/dL — ABNORMAL LOW (ref 12.0–15.0)
Immature Granulocytes: 0 %
Lymphocytes Relative: 25 %
Lymphs Abs: 2 10*3/uL (ref 0.7–4.0)
MCH: 25.1 pg — ABNORMAL LOW (ref 26.0–34.0)
MCHC: 35.5 g/dL (ref 30.0–36.0)
MCV: 70.8 fL — ABNORMAL LOW (ref 80.0–100.0)
Monocytes Absolute: 0.9 10*3/uL (ref 0.1–1.0)
Monocytes Relative: 12 %
Neutro Abs: 4.7 10*3/uL (ref 1.7–7.7)
Neutrophils Relative %: 58 %
Platelets: 314 10*3/uL (ref 150–400)
RBC: 4.66 MIL/uL (ref 3.87–5.11)
RDW: 16.5 % — ABNORMAL HIGH (ref 11.5–15.5)
WBC: 8 10*3/uL (ref 4.0–10.5)
nRBC: 0 % (ref 0.0–0.2)

## 2019-10-14 LAB — COMPREHENSIVE METABOLIC PANEL
ALT: 803 U/L — ABNORMAL HIGH (ref 0–44)
AST: 1260 U/L — ABNORMAL HIGH (ref 15–41)
Albumin: 2.5 g/dL — ABNORMAL LOW (ref 3.5–5.0)
Alkaline Phosphatase: 167 U/L — ABNORMAL HIGH (ref 38–126)
Anion gap: 9 (ref 5–15)
BUN: 9 mg/dL (ref 6–20)
CO2: 21 mmol/L — ABNORMAL LOW (ref 22–32)
Calcium: 9 mg/dL (ref 8.9–10.3)
Chloride: 107 mmol/L (ref 98–111)
Creatinine, Ser: 0.5 mg/dL (ref 0.44–1.00)
GFR calc Af Amer: >60 mL/min (ref >60)
GFR calc non Af Amer: >60 mL/min (ref >60)
Glucose, Bld: 95 mg/dL (ref 70–99)
Potassium: 4.3 mmol/L (ref 3.5–5.1)
Sodium: 137 mmol/L (ref 135–145)
Total Bilirubin: 7.8 mg/dL — ABNORMAL HIGH (ref 0.3–1.2)
Total Protein: 6.9 g/dL (ref 6.5–8.1)

## 2019-10-14 MED ORDER — HYDROXYZINE HCL 10 MG/5ML PO SYRP
10.0000 mg | ORAL_SOLUTION | Freq: Three times a day (TID) | ORAL | Status: DC | PRN
  Administered 2019-10-14 – 2019-10-22 (×6): 10 mg via ORAL
  Filled 2019-10-14 (×7): qty 5

## 2019-10-14 MED ORDER — ATENOLOL 25 MG PO TABS
12.5000 mg | ORAL_TABLET | Freq: Two times a day (BID) | ORAL | Status: DC
  Administered 2019-10-14 – 2019-10-22 (×17): 12.5 mg via ORAL
  Filled 2019-10-14 (×18): qty 1

## 2019-10-14 MED ORDER — POLYVINYL ALCOHOL 1.4 % OP SOLN
1.0000 [drp] | OPHTHALMIC | Status: DC | PRN
  Filled 2019-10-14: qty 15

## 2019-10-14 MED ORDER — LOPERAMIDE HCL 2 MG PO CAPS
2.0000 mg | ORAL_CAPSULE | ORAL | Status: DC | PRN
  Administered 2019-10-14 – 2019-10-18 (×6): 2 mg via ORAL
  Filled 2019-10-14 (×6): qty 1

## 2019-10-14 MED ORDER — CAMPHOR-MENTHOL 0.5-0.5 % EX LOTN
TOPICAL_LOTION | CUTANEOUS | Status: DC | PRN
  Filled 2019-10-14: qty 222

## 2019-10-14 NOTE — Plan of Care (Signed)

## 2019-10-14 NOTE — Progress Notes (Signed)
Went to start patient's PIV and she requested IV team come back at a later time. RN Maygan made aware.

## 2019-10-14 NOTE — Progress Notes (Signed)
This AM pt refused IV placement, IV fluids, and Lovenox injection saying "I get up and move around enough, I don't need that" pt educated and still refusing at this time. Will continue to educate and monitor.

## 2019-10-14 NOTE — Progress Notes (Addendum)
PROGRESS NOTE  Mary Frey QMV:[id number] DOB: 03/01/1976 DOA: 10/11/2019 PCP: Mora Bellman, MD  HPI/Recap of past 24 hours: HPI: Mary Frey is a 44 y.o. female with medical history significant for Graves' disease who presents to the emergency room with a 3-week history of diarrhea lower abdominal pain.  Patient states that she had been on methimazole 5 mg twice daily from 2014 up until December 2020 when she stopped taking the medication.  Over the course of the next 6 months she was initially doing well but then started losing weight and feeling very fatigued.  She contacted her endocrinologist 3 weeks ago who and some blood tests.  Patient states they told her her thyroid was very hyperactive so they were going to start her on a higher dose of methimazole 10 mg twice daily.  She said since starting the medication she progressively developed diarrhea and then lower abdominal pain.  She vomited once today.  She denied fever or chills.  Denied dysuria denied chest pains or shortness of breath.  The symptoms started after taking the medication, she stopped taking it 3 days ago but her discomfort continued.  Additionally she complained of dark-colored urine and pruritus.  ED Course: On arrival in the emergency room she was mildly tachycardic at 101 but with otherwise normal vitals.  Labs notable for markedly elevated LFTs with AST 1467, ALT 883, alk phos 200 and total bilirubin 9.5.  Compared to blood work done 3 months ago when AST was slightly elevated at 50 but with otherwise normal liver function.  Labs were mostly unremarkable.  Right upper quadrant sonogram was essentially negative.  Hospitalist consulted for admission possible methimazole hepatotoxicity.  GI following.  10/14/19: Seen and examined.  Mainly reports pruritus and intermittent loose stools.  Spoke with her endocrinologist this AM, Dr. Katheran James.  He recommended continue to hold off methimazole and PTU which are  contraindicated in the setting of methimazole drug induced liver toxicity.  He last seen her on 42/26/21 with plan for radioactive iodine ablation at [facility].  He also recommends starting a BB, atenolol, to prevent thyroid storm.  He did not think that she needs steroids or potassium iodine at this time.  The potassium iodine can affect her radioactive iodine ablation.  He is available at [phone]  For further questions.  She has an appointment scheduled with Dr. Hartford Poli on 10/21/19 at 2:40 PM.   Assessment/Plan: Principal Problem:   Abnormal LFTs Active Problems:   Graves disease   HTN (hypertension)   History of use of hepatotoxic drug   Abnormal liver enzymes  Methimazole induced liver toxicity, POA Presented with significantly elevated Abnormal LFTs in the setting of recent restart of methimazole, highly suspicious for methimazole toxicity. -Also presenting with lower abdominal discomfort and diarrhea with onset after restarting methimazole at a higher dose than her usual -Acute hepatitis panel negative.   -Continue to avoid hepatotoxic agents  -Continue IV hydration NS at 100 cc /hr -Discussed with her endocrinologist as stated above -GI also following. -Continue to trend liver enzymes -Needs to keep appointment scheduled for 10/21/19 at 2:40 PM with Dr. Katheran James. -Plan for radiation iodine ablation  Graves disease -Patient was restarted on methimazole 3 weeks prior for symptomatic hyperthyroidism -TSH less than 0.010, free T4 2.87. -No Endocrinology service at [facility] with her endocrinologist this AM, Dr. Katheran James.  He recommended continue to hold off methimazole and PTU which are contraindicated in the setting of  methimazole drug induced liver toxicity.  He last seen her on 42/26/21 with plan for radioactive iodine ablation at [facility].  He also recommends starting a BB, atenolol, to prevent thyroid storm.  He did not think that she needs steroids or  potassium iodine at this time.  The potassium iodine can affect her radioactive iodine ablation.  He is available at [phone]  For further questions.  She has an appointment scheduled with Dr. Hartford Poli on 10/21/19 at 2:40 PM.  Acute transaminitis in the setting of Methimazole liver toxicity Presented with alkaline phosphatase 200, AST 1400, ALT 800, T bilirubin 9.5 RUQ [place] neagtive LFTs still significantly elevated Continue IV fluid hydration Continue to trend LFTs  Hyperbilirubinemia with Graves' disease drug toxicity Presented with T bili of 9.5 Levels are trending down 7.8 Continue to monitor  Essential HTN Hold off HCTZ Start Atenolol 12.5 mg BID Continue to monitor VS  Iron deficiency anemia Hg stable and trending up 11.7  Frequent loose stools, POA Afebrile with no leukocytosis Less likely infective Start PRN Imodium  Pruritus in the setting of liver toxicity Atarax and benadryl PRN Add Sarna     DVT prophylaxis: Lovenox subcu daily Code Status: full code  Family Communication:  none     Status is: Inpatient   Dispo: The patient is from: Home.              Anticipated d/c is to: Home.              Anticipated d/c date is: 10/16/2019               Patient currently currently not stable for discharge due to severe acute transaminitis with ongoing work-up.         Objective: Vitals:   10/13/19 1315 10/13/19 1529 10/13/19 2146 10/14/19 1049  BP: 135/82 135/83 138/87 137/83  Pulse: 73 75 69 73  Resp: 18 18 18 17   Temp: 97.9 F (36.6 C) 98.5 F (36.9 C) 98.6 F (37 C) 98.5 F (36.9 C)  TempSrc: Oral  Oral Oral  SpO2: 100% 100% 100% 100%  Weight:      Height:        Intake/Output Summary (Last 24 hours) at 10/14/2019 1122 Last data filed at 10/13/2019 1544 Gross per 24 hour  Intake 449.65 ml  Output --  Net 449.65 ml   Filed Weights   10/12/19 0939 10/12/19 0940  Weight: 62.1 kg 54.4 kg    Exam:  . General: 44 y.o. year-old female Well  developed well nourished.  Alert and oriented x3. .  Cardiovascular: Regular rate and rhythm with no rubs and gallops. Marland Kitchen Respiratory: Clear to auscultation. No rubs or gallops. . Abdomen: Soft left lower quadrant tenderness with palpation.  Normal bowel sounds x4 quadrants. . Musculoskeletal: No LE edema. 2/4 pulses in all 4 . Psychiatry: Mood is appropriate for condition and setting   Data Reviewed: CBC: Recent Labs  Lab 10/11/19 1851 10/12/19 1651 10/14/19 0420  WBC 6.6 7.1 8.0  NEUTROABS  --  4.5 4.7  HGB 13.3 11.5* 11.7*  HCT 37.5 32.3* 33.0*  MCV 71.6* 70.7* 70.8*  PLT 356 300 824   Basic Metabolic Panel: Recent Labs  Lab 10/11/19 1851 10/12/19 1651 10/14/19 0420  NA 134* 138 137  K 3.9 4.1 4.3  CL 104 107 107  CO2 22 23 21*  GLUCOSE 117* 114* 95  BUN 9 8 9   CREATININE 0.51 0.54 0.50  CALCIUM 9.4 9.0 9.0  GFR: Estimated Creatinine Clearance: 78.7 mL/min (by C-G formula based on SCr of 0.5 mg/dL). Liver Function Tests: Recent Labs  Lab 10/11/19 1851 10/12/19 1651 10/14/19 0420  AST 1,467* 1,151* 1,260*  ALT 883* 748* 803*  ALKPHOS 200* 173* 167*  BILITOT 9.5* 7.9* 7.8*  PROT 8.2* 6.9 6.9  ALBUMIN 3.1* 2.6* 2.5*   Recent Labs  Lab 10/11/19 1851  LIPASE 22   No results for input(s): AMMONIA in the last 168 hours. Coagulation Profile: Recent Labs  Lab 10/12/19 1651  INR 1.0   Cardiac Enzymes: No results for input(s): CKTOTAL, CKMB, CKMBINDEX, TROPONINI in the last 168 hours. BNP (last 3 results) No results for input(s): PROBNP in the last 8760 hours. HbA1C: No results for input(s): HGBA1C in the last 72 hours. CBG: No results for input(s): GLUCAP in the last 168 hours. Lipid Profile: No results for input(s): CHOL, HDL, LDLCALC, TRIG, CHOLHDL, LDLDIRECT in the last 72 hours. Thyroid Function Tests: Recent Labs    10/12/19 1651  TSH <0.010*  FREET4 2.87*   Anemia Panel: Recent Labs    10/12/19 1651  FERRITIN 348*  TIBC 301  IRON  99   Urine analysis:    Component Value Date/Time   COLORURINE AMBER (A) 10/11/2019 1845   APPEARANCEUR HAZY (A) 10/11/2019 1845   LABSPEC 1.024 10/11/2019 1845   PHURINE 5.0 10/11/2019 1845   GLUCOSEU 50 (A) 10/11/2019 1845   HGBUR NEGATIVE 10/11/2019 1845   BILIRUBINUR MODERATE (A) 10/11/2019 1845   KETONESUR NEGATIVE 10/11/2019 1845   PROTEINUR NEGATIVE 10/11/2019 1845   UROBILINOGEN 0.2 03/30/2019 1215   NITRITE NEGATIVE 10/11/2019 1845   LEUKOCYTESUR NEGATIVE 10/11/2019 1845   Sepsis Labs: @LABRCNTIP (procalcitonin:4,lacticidven:4)  ) Recent Results (from the past 240 hour(s))  SARS Coronavirus 2 by RT PCR (hospital order, performed in [place] hospital lab) Nasopharyngeal Nasopharyngeal Swab     Status: None   Collection Time: 10/12/19  1:43 AM   Specimen: Nasopharyngeal Swab  Result Value Ref Range Status   SARS Coronavirus 2 NEGATIVE NEGATIVE Final    Comment: (NOTE) SARS-CoV-2 target nucleic acids are NOT DETECTED.  The SARS-CoV-2 RNA is generally detectable in upper and lower respiratory specimens during the acute phase of infection. The lowest concentration of SARS-CoV-2 viral copies this assay can detect is 250 copies / mL. A negative result does not preclude SARS-CoV-2 infection and should not be used as the sole basis for treatment or other patient management decisions.  A negative result may occur with improper specimen collection / handling, submission of specimen other than nasopharyngeal swab, presence of viral mutation(s) within the areas targeted by this assay, and inadequate number of viral copies (<250 copies / mL). A negative result must be combined with clinical observations, patient history, and epidemiological information.  Fact Sheet for Patients:   StrictlyIdeas.no  Fact Sheet for Healthcare Providers: BankingDealers.co.za  This test is not yet approved or  cleared by the [place] FDA  and has been authorized for detection and/or diagnosis of SARS-CoV-2 by FDA under an Emergency Use Authorization (EUA).  This EUA will remain in effect (meaning this test can be used) for the duration of the COVID-19 declaration under Section 564(b)(1) of the Act, 21 U.S.C. section 360bbb-3(b)(1), unless the authorization is terminated or revoked sooner.  Performed at [facility], [place] [address], [place], [place] [address]       Studies: No results found.  Scheduled Meds: . atenolol  12.5 mg Oral BID  . enoxaparin (LOVENOX)  injection  40 mg Subcutaneous Q24H  . gabapentin  100 mg Oral TID  . sodium chloride flush  3 mL Intravenous Once    Continuous Infusions: . sodium chloride Stopped (10/14/19 0948)     LOS: 1 day     Kayleen Memos, MD [facility] Pager [phone]  If 7PM-7AM, please contact night-coverage [email or web address] Password [facility] 10/14/2019, 11:22 AM

## 2019-10-15 LAB — ENA+DNA/DS+ANTICH+CENTRO+JO...
Anti JO-1: <0.2 AI (ref 0.0–0.9)
Centromere Ab Screen: <0.2 AI (ref 0.0–0.9)
Chromatin Ab SerPl-aCnc: <0.2 AI (ref 0.0–0.9)
ENA SM Ab Ser-aCnc: 0.3 AI (ref 0.0–0.9)
Ribonucleic Protein: <0.2 AI (ref 0.0–0.9)
SSA (Ro) (ENA) Antibody, IgG: 1.1 AI — ABNORMAL HIGH (ref 0.0–0.9)
SSB (La) (ENA) Antibody, IgG: <0.2 AI (ref 0.0–0.9)
Scleroderma (Scl-70) (ENA) Antibody, IgG: <0.2 AI (ref 0.0–0.9)
ds DNA Ab: 1 IU/mL (ref 0–9)

## 2019-10-15 LAB — COMPREHENSIVE METABOLIC PANEL
ALT: 861 U/L — ABNORMAL HIGH (ref 0–44)
ALT: 956 U/L — ABNORMAL HIGH (ref 0–44)
AST: 1265 U/L — ABNORMAL HIGH (ref 15–41)
AST: 1391 U/L — ABNORMAL HIGH (ref 15–41)
Albumin: 2.5 g/dL — ABNORMAL LOW (ref 3.5–5.0)
Albumin: 2.6 g/dL — ABNORMAL LOW (ref 3.5–5.0)
Alkaline Phosphatase: 153 U/L — ABNORMAL HIGH (ref 38–126)
Alkaline Phosphatase: 165 U/L — ABNORMAL HIGH (ref 38–126)
Anion gap: 9 (ref 5–15)
Anion gap: 8 (ref 5–15)
BUN: 11 mg/dL (ref 6–20)
BUN: 11 mg/dL (ref 6–20)
CO2: 21 mmol/L — ABNORMAL LOW (ref 22–32)
CO2: 22 mmol/L (ref 22–32)
Calcium: 9.1 mg/dL (ref 8.9–10.3)
Calcium: 9 mg/dL (ref 8.9–10.3)
Chloride: 106 mmol/L (ref 98–111)
Chloride: 104 mmol/L (ref 98–111)
Creatinine, Ser: 0.5 mg/dL (ref 0.44–1.00)
Creatinine, Ser: 0.5 mg/dL (ref 0.44–1.00)
GFR calc Af Amer: >60 mL/min (ref >60)
GFR calc Af Amer: >60 mL/min (ref >60)
GFR calc non Af Amer: >60 mL/min (ref >60)
GFR calc non Af Amer: >60 mL/min (ref >60)
Glucose, Bld: 85 mg/dL (ref 70–99)
Glucose, Bld: 141 mg/dL — ABNORMAL HIGH (ref 70–99)
Potassium: 4.5 mmol/L (ref 3.5–5.1)
Potassium: 4.6 mmol/L (ref 3.5–5.1)
Sodium: 136 mmol/L (ref 135–145)
Sodium: 134 mmol/L — ABNORMAL LOW (ref 135–145)
Total Bilirubin: 7.7 mg/dL — ABNORMAL HIGH (ref 0.3–1.2)
Total Bilirubin: 9 mg/dL — ABNORMAL HIGH (ref 0.3–1.2)
Total Protein: 6.6 g/dL (ref 6.5–8.1)
Total Protein: 6.9 g/dL (ref 6.5–8.1)

## 2019-10-15 LAB — EPSTEIN-BARR VIRUS VCA, IGM: EBV VCA IgM: <36 U/mL (ref 0.0–35.9)

## 2019-10-15 LAB — HSV(HERPES SIMPLEX VRS) I + II AB-IGM: HSVI/II Comb IgM: 1.39 Ratio — ABNORMAL HIGH (ref 0.00–0.90)

## 2019-10-15 LAB — MITOCHONDRIAL ANTIBODIES: Mitochondrial M2 Ab, IgG: <20 Units (ref 0.0–20.0)

## 2019-10-15 LAB — ANA W/REFLEX IF POSITIVE: Anti Nuclear Antibody (ANA): POSITIVE — AB

## 2019-10-15 LAB — ANTI-SMOOTH MUSCLE ANTIBODY, IGG: F-Actin IgG: 10 Units (ref 0–19)

## 2019-10-15 MED ORDER — MELATONIN 5 MG PO TABS
5.0000 mg | ORAL_TABLET | Freq: Every day | ORAL | Status: DC
  Administered 2019-10-15 – 2019-10-21 (×6): 5 mg via ORAL
  Filled 2019-10-15 (×8): qty 1

## 2019-10-15 MED ORDER — FERROUS SULFATE 325 (65 FE) MG PO TABS
325.0000 mg | ORAL_TABLET | Freq: Every day | ORAL | Status: DC
  Administered 2019-10-16 – 2019-10-22 (×7): 325 mg via ORAL
  Filled 2019-10-15 (×7): qty 1

## 2019-10-15 NOTE — Progress Notes (Signed)
Patient with transaminitis, possibly related to methimazole use vs. uncontrolled hyperthyroidism.  Patient is relatively asymptomatic from a GI standpoint.  Hepatology work-up was unremarkable.  She was HSV IgM positive, but unlikely this is HSV transaminitis -LFTs still elevated but T. Bili is trending down. Currently,T. Bili 7.7/ AST 1265/ ALT861/ ALP 153   -Acute hepatitis panel negative. -CMV and EBV IgM negative -No deficiencies of alpha-1-antitrypsin and ceruloplasmin -ASMA and AMA negative -Right upper quadrant ultrasound was unremarkable  Diarrhea x 3 weeks, likely related to Graves' disease.  Patient states symptoms are improving.    Weight loss, likely related to Graves' disease.  ANA positive (reflex pending); SSA-Ro positive  Patient is to see endocrinology as an outpatient for possible iodine ablation of thyroid.  [facility] will sign off. Please contact us if we can be of any further assistance with this hospital stay.  Salley Slaughter PA-C 10/15/2019, 8:42 AM  Contact #  [phone]

## 2019-10-15 NOTE — Plan of Care (Signed)

## 2019-10-15 NOTE — Progress Notes (Signed)
PROGRESS NOTE  Mary Frey DVV:[id number] DOB: 1975/05/30 DOA: 10/11/2019 PCP: Mora Bellman, MD  HPI/Recap of past 24 hours: HPI: Mary Frey is a 44 y.o. female with medical history significant for Graves' disease who presents to the emergency room with a 3-week history of diarrhea lower abdominal pain.  Patient states that she had been on methimazole 5 mg twice daily from 2014 up until December 2020 when she stopped taking the medication.  Over the course of the next 6 months she was initially doing well but then started losing weight and feeling very fatigued.  She contacted her endocrinologist 3 weeks ago who and some blood tests.  Patient states they told her her thyroid was very hyperactive so they were going to start her on a higher dose of methimazole 10 mg twice daily.  She said since starting the medication she progressively developed diarrhea and then lower abdominal pain.  She vomited once today.  She denied fever or chills.  Denied dysuria denied chest pains or shortness of breath.  The symptoms started after taking the medication, she stopped taking it 3 days ago but her discomfort continued.  Additionally she complained of dark-colored urine and pruritus.  ED Course: On arrival in the emergency room she was mildly tachycardic at 101 but with otherwise normal vitals.  Labs notable for markedly elevated LFTs with AST 1467, ALT 883, alk phos 200 and total bilirubin 9.5.  Compared to blood work done 3 months ago when AST was slightly elevated at 50 but with otherwise normal liver function.  Labs were mostly unremarkable.  Right upper quadrant sonogram was essentially negative.  Hospitalist consulted for admission possible methimazole hepatotoxicity.  GI following.  10/14/19:  Spoke with her endocrinologist this AM, Dr. Katheran James.  He recommended continue to hold off methimazole and PTU which are contraindicated in the setting of methimazole drug induced liver toxicity.  He last  seen her on 09/15/19 with plan for radioactive iodine ablation at [facility].  He also recommends starting a BB, atenolol, to prevent thyroid storm.  He did not think that she needs steroids or potassium iodine at this time.  The potassium iodine can affect her radioactive iodine ablation.  He is available at [phone]  For further questions.  She has an appointment scheduled with Dr. Hartford Poli on 10/21/19 at 2:40 PM.   10/15/19: Seen and examined at her bedside.  She states she feels better today.  LFTs are slowly trending down.  Increased IV fluid rate to 150 cc/h NS.   Assessment/Plan: Principal Problem:   Abnormal LFTs Active Problems:   Graves disease   HTN (hypertension)   History of use of hepatotoxic drug   Abnormal liver enzymes  Methimazole induced liver toxicity, POA Presented with significantly elevated Abnormal LFTs in the setting of recent restart of methimazole, highly suspicious for methimazole toxicity. -Also presenting with lower abdominal discomfort and diarrhea with onset after restarting methimazole at a higher dose than her usual -Acute hepatitis panel negative.   -Continue to avoid hepatotoxic agents  -Continue IV hydration NS, rate increased to 150 cc/h  -Discussed with her endocrinologist as stated above -GI also following. -Continue to trend liver enzymes -Needs to keep appointment scheduled for 10/21/19 at 2:40 PM with Dr. Katheran James. -Plan for radiation iodine ablation  Graves disease -Patient was restarted on methimazole 3 weeks prior for symptomatic hyperthyroidism -TSH less than 0.010, free T4 2.87. -No Endocrinology service at [facility] with her endocrinologist  this AM, Dr. Katheran James.  He recommended continue to hold off methimazole and PTU which are contraindicated in the setting of methimazole drug induced liver toxicity.  He last seen her on 09/15/19 with plan for radioactive iodine ablation at [facility].  He also recommends starting  a BB, atenolol, to prevent thyroid storm.  He did not think that she needs steroids or potassium iodine at this time.  The potassium iodine can affect her radioactive iodine ablation.  He is available at [phone]  For further questions.  She has an appointment scheduled with Dr. Hartford Poli on 10/21/19 at 2:40 PM.  Acute transaminitis in the setting of Methimazole liver toxicity Presented with alkaline phosphatase 200, AST 1400, ALT 800, T bilirubin 9.5 RUQ [place] neagtive LFTs still significantly elevated Continue IV fluid hydration 150 cc/h normal saline. Continue to trend LFTs  Hyperbilirubinemia with Graves' disease drug toxicity Presented with T bili of 9.5 Levels are trending down 7.8 Continue to monitor  Essential HTN Continue to hold off HCTZ Continue atenolol 12.5 mg BID Continue to monitor vital signs  Iron deficiency anemia Hg stable and trending up 11.7, MCV 70 Resume home ferrous sulfate supplement  Improving, frequent loose stools, POA Afebrile with no leukocytosis Less likely infective Continue PRN Imodium  Pruritus in the setting of liver toxicity Continue as needed Atarax and benadryl PRN Continue as needed Sarna lotion     DVT prophylaxis: Lovenox subcu daily Code Status: full code  Family Communication:  none     Status is: Inpatient   Dispo: The patient is from: Home.              Anticipated d/c is to: Home.              Anticipated d/c date is: 10/16/2019               Patient currently currently not stable for discharge due to drug induced liver toxicity requiring IV fluid hydration, and close monitoring        Objective: Vitals:   10/14/19 1345 10/14/19 2053 10/15/19 0502 10/15/19 0942  BP: 138/80 137/79 119/74 118/80  Pulse: 90 79 63 64  Resp: 18 18 16 16   Temp: 98.8 F (37.1 C) 97.9 F (36.6 C) 98.7 F (37.1 C) 98.4 F (36.9 C)  TempSrc: Oral Oral Oral Oral  SpO2: 100% 97% 100% 97%  Weight:      Height:       No intake or  output data in the 24 hours ending 10/15/19 1302 Filed Weights   10/12/19 0939 10/12/19 0940  Weight: 62.1 kg 54.4 kg    Exam:  . General: 44 y.o. year-old female thin built in no acute distress.  Alert oriented x3. .  Cardiovascular: Regular rate and rhythm no rubs or gallops.   Marland Kitchen Respiratory: Clear auscultation no wheezes or rales. . Abdomen: Soft nontender nondistended normal bowel sounds present.   . Musculoskeletal: No lower extremity edema bilaterally.   Marland Kitchen Psychiatry: Mood is appropriate for condition and setting.  Data Reviewed: CBC: Recent Labs  Lab 10/11/19 1851 10/12/19 1651 10/14/19 0420  WBC 6.6 7.1 8.0  NEUTROABS  --  4.5 4.7  HGB 13.3 11.5* 11.7*  HCT 37.5 32.3* 33.0*  MCV 71.6* 70.7* 70.8*  PLT 356 300 384   Basic Metabolic Panel: Recent Labs  Lab 10/11/19 1851 10/12/19 1651 10/14/19 0420 10/15/19 0619  NA 134* 138 137 136  K 3.9 4.1 4.3 4.5  CL 104 107 107  106  CO2 22 23 21* 21*  GLUCOSE 117* 114* 95 85  BUN 9 8 9 11   CREATININE 0.51 0.54 0.50 0.50  CALCIUM 9.4 9.0 9.0 9.1   GFR: Estimated Creatinine Clearance: 78.7 mL/min (by C-G formula based on SCr of 0.5 mg/dL). Liver Function Tests: Recent Labs  Lab 10/11/19 1851 10/12/19 1651 10/14/19 0420 10/15/19 0619  AST 1,467* 1,151* 1,260* 1,265*  ALT 883* 748* 803* 861*  ALKPHOS 200* 173* 167* 153*  BILITOT 9.5* 7.9* 7.8* 7.7*  PROT 8.2* 6.9 6.9 6.6  ALBUMIN 3.1* 2.6* 2.5* 2.5*   Recent Labs  Lab 10/11/19 1851  LIPASE 22   No results for input(s): AMMONIA in the last 168 hours. Coagulation Profile: Recent Labs  Lab 10/12/19 1651  INR 1.0   Cardiac Enzymes: No results for input(s): CKTOTAL, CKMB, CKMBINDEX, TROPONINI in the last 168 hours. BNP (last 3 results) No results for input(s): PROBNP in the last 8760 hours. HbA1C: No results for input(s): HGBA1C in the last 72 hours. CBG: No results for input(s): GLUCAP in the last 168 hours. Lipid Profile: No results for input(s):  CHOL, HDL, LDLCALC, TRIG, CHOLHDL, LDLDIRECT in the last 72 hours. Thyroid Function Tests: Recent Labs    10/12/19 1651  TSH <0.010*  FREET4 2.87*   Anemia Panel: Recent Labs    10/12/19 1651  FERRITIN 348*  TIBC 301  IRON 99   Urine analysis:    Component Value Date/Time   COLORURINE AMBER (A) 10/11/2019 1845   APPEARANCEUR HAZY (A) 10/11/2019 1845   LABSPEC 1.024 10/11/2019 1845   PHURINE 5.0 10/11/2019 1845   GLUCOSEU 50 (A) 10/11/2019 1845   HGBUR NEGATIVE 10/11/2019 1845   BILIRUBINUR MODERATE (A) 10/11/2019 1845   KETONESUR NEGATIVE 10/11/2019 1845   PROTEINUR NEGATIVE 10/11/2019 1845   UROBILINOGEN 0.2 03/30/2019 1215   NITRITE NEGATIVE 10/11/2019 1845   LEUKOCYTESUR NEGATIVE 10/11/2019 1845   Sepsis Labs: @LABRCNTIP (procalcitonin:4,lacticidven:4)  ) Recent Results (from the past 240 hour(s))  SARS Coronavirus 2 by RT PCR (hospital order, performed in [place] hospital lab) Nasopharyngeal Nasopharyngeal Swab     Status: None   Collection Time: 10/12/19  1:43 AM   Specimen: Nasopharyngeal Swab  Result Value Ref Range Status   SARS Coronavirus 2 NEGATIVE NEGATIVE Final    Comment: (NOTE) SARS-CoV-2 target nucleic acids are NOT DETECTED.  The SARS-CoV-2 RNA is generally detectable in upper and lower respiratory specimens during the acute phase of infection. The lowest concentration of SARS-CoV-2 viral copies this assay can detect is 250 copies / mL. A negative result does not preclude SARS-CoV-2 infection and should not be used as the sole basis for treatment or other patient management decisions.  A negative result may occur with improper specimen collection / handling, submission of specimen other than nasopharyngeal swab, presence of viral mutation(s) within the areas targeted by this assay, and inadequate number of viral copies (<250 copies / mL). A negative result must be combined with clinical observations, patient history, and epidemiological  information.  Fact Sheet for Patients:   StrictlyIdeas.no  Fact Sheet for Healthcare Providers: BankingDealers.co.za  This test is not yet approved or  cleared by the [place] FDA and has been authorized for detection and/or diagnosis of SARS-CoV-2 by FDA under an Emergency Use Authorization (EUA).  This EUA will remain in effect (meaning this test can be used) for the duration of the COVID-19 declaration under Section 564(b)(1) of the Act, 21 U.S.C. section 360bbb-3(b)(1), unless the authorization is terminated  or revoked sooner.  Performed at [facility], [place] [address] [place], [place] [address]       Studies: No results found.  Scheduled Meds: . atenolol  12.5 mg Oral BID  . enoxaparin (LOVENOX) injection  40 mg Subcutaneous Q24H  . gabapentin  100 mg Oral TID  . sodium chloride flush  3 mL Intravenous Once    Continuous Infusions: . sodium chloride 150 mL/hr at 10/15/19 0836     LOS: 2 days     Kayleen Memos, MD [facility] Pager [phone]  If 7PM-7AM, please contact night-coverage [email or web address] Password [email or web address] 10/15/2019, 1:02 PM

## 2019-10-16 ENCOUNTER — Inpatient Hospital Stay: Payer: Self-pay

## 2019-10-16 LAB — CBC WITH DIFFERENTIAL/PLATELET
Abs Immature Granulocytes: 0.04 10*3/uL (ref 0.00–0.07)
Basophils Absolute: 0 10*3/uL (ref 0.0–0.1)
Basophils Relative: 0 %
Eosinophils Absolute: 0.2 10*3/uL (ref 0.0–0.5)
Eosinophils Relative: 3 %
HCT: 29.1 % — ABNORMAL LOW (ref 36.0–46.0)
Hemoglobin: 10.4 g/dL — ABNORMAL LOW (ref 12.0–15.0)
Immature Granulocytes: 0 %
Lymphocytes Relative: 22 %
Lymphs Abs: 2 10*3/uL (ref 0.7–4.0)
MCH: 25 pg — ABNORMAL LOW (ref 26.0–34.0)
MCHC: 35.7 g/dL (ref 30.0–36.0)
MCV: 70 fL — ABNORMAL LOW (ref 80.0–100.0)
Monocytes Absolute: 0.9 10*3/uL (ref 0.1–1.0)
Monocytes Relative: 10 %
Neutro Abs: 6 10*3/uL (ref 1.7–7.7)
Neutrophils Relative %: 65 %
Platelets: 361 10*3/uL (ref 150–400)
RBC: 4.16 MIL/uL (ref 3.87–5.11)
RDW: 16.9 % — ABNORMAL HIGH (ref 11.5–15.5)
WBC: 9.1 10*3/uL (ref 4.0–10.5)
nRBC: 0 % (ref 0.0–0.2)

## 2019-10-16 LAB — COMPREHENSIVE METABOLIC PANEL
ALT: 1094 U/L — ABNORMAL HIGH (ref 0–44)
ALT: 1028 U/L — ABNORMAL HIGH (ref 0–44)
AST: 1679 U/L — ABNORMAL HIGH (ref 15–41)
AST: 1382 U/L — ABNORMAL HIGH (ref 15–41)
Albumin: 2.8 g/dL — ABNORMAL LOW (ref 3.5–5.0)
Albumin: 2.5 g/dL — ABNORMAL LOW (ref 3.5–5.0)
Alkaline Phosphatase: 177 U/L — ABNORMAL HIGH (ref 38–126)
Alkaline Phosphatase: 159 U/L — ABNORMAL HIGH (ref 38–126)
Anion gap: 9 (ref 5–15)
Anion gap: 7 (ref 5–15)
BUN: 10 mg/dL (ref 6–20)
BUN: 14 mg/dL (ref 6–20)
CO2: 23 mmol/L (ref 22–32)
CO2: 22 mmol/L (ref 22–32)
Calcium: 9.4 mg/dL (ref 8.9–10.3)
Calcium: 8.9 mg/dL (ref 8.9–10.3)
Chloride: 103 mmol/L (ref 98–111)
Chloride: 105 mmol/L (ref 98–111)
Creatinine, Ser: 0.63 mg/dL (ref 0.44–1.00)
Creatinine, Ser: 0.76 mg/dL (ref 0.44–1.00)
GFR calc Af Amer: >60 mL/min (ref >60)
GFR calc Af Amer: >60 mL/min (ref >60)
GFR calc non Af Amer: >60 mL/min (ref >60)
GFR calc non Af Amer: >60 mL/min (ref >60)
Glucose, Bld: 101 mg/dL — ABNORMAL HIGH (ref 70–99)
Glucose, Bld: 112 mg/dL — ABNORMAL HIGH (ref 70–99)
Potassium: 4.4 mmol/L (ref 3.5–5.1)
Potassium: 4.3 mmol/L (ref 3.5–5.1)
Sodium: 135 mmol/L (ref 135–145)
Sodium: 134 mmol/L — ABNORMAL LOW (ref 135–145)
Total Bilirubin: 10.5 mg/dL — ABNORMAL HIGH (ref 0.3–1.2)
Total Bilirubin: 9.4 mg/dL — ABNORMAL HIGH (ref 0.3–1.2)
Total Protein: 7.3 g/dL (ref 6.5–8.1)
Total Protein: 6.9 g/dL (ref 6.5–8.1)

## 2019-10-16 LAB — URINALYSIS, ROUTINE W REFLEX MICROSCOPIC
Bilirubin Urine: NEGATIVE
Glucose, UA: NEGATIVE mg/dL
Ketones, ur: NEGATIVE mg/dL
Nitrite: NEGATIVE
Protein, ur: NEGATIVE mg/dL
Specific Gravity, Urine: 1.01 (ref 1.005–1.030)
pH: 5 (ref 5.0–8.0)

## 2019-10-16 LAB — PROTIME-INR
INR: 1.1 (ref 0.8–1.2)
Prothrombin Time: 13.6 seconds (ref 11.4–15.2)

## 2019-10-16 LAB — AMMONIA: Ammonia: 27 umol/L (ref 9–35)

## 2019-10-16 MED ORDER — SODIUM CHLORIDE 0.9% FLUSH
10.0000 mL | Freq: Two times a day (BID) | INTRAVENOUS | Status: DC
  Administered 2019-10-16 – 2019-10-19 (×3): 10 mL

## 2019-10-16 MED ORDER — IOHEXOL 9 MG/ML PO SOLN
ORAL | Status: AC
  Administered 2019-10-16: 500 mL
  Filled 2019-10-16: qty 500

## 2019-10-16 MED ORDER — SODIUM CHLORIDE 0.9% FLUSH: 10.0000 mL | INTRAVENOUS | Status: DC | PRN

## 2019-10-16 NOTE — Progress Notes (Signed)
At bedside per pt request to not have female insert IV access. Attempted to obtain consent for PICC placement.  Pt refused PICC placement "I do not want any kind of device in my chest. Instructed pt on PIV, midline and PICC lines, differences, risks and benefits.  Pt gave verbal consent for midline placement.  "I would rather be stuck for blood work than to have the PICC in my chest"- referring to unable to always obtain labs via midline vs PICC line.   Midline placed.

## 2019-10-16 NOTE — Progress Notes (Signed)
Contrast was presented from CT.  Patient has concerns about doing CT scan.  She is concerned about receiving too much radiation b/c of her Graves disease radiation treatment and now this CT scan.  Contacted CT at [phone].  CT was able to answer her questions and addressed her concerns.    Contrast given to patient and she is to finish it by 2200 tonight.

## 2019-10-16 NOTE — Progress Notes (Signed)
Patient became irate because when CT came and left the contrast they instructed nurse to not have patient eat food or drink fluids while taking contrast.  This was explained to patient before the dinner tray was removed to the side.  Patient called nurse to the room and all of a sudden accused nurse of taking her food without her permission.    Patient argued with nurse and charge nurse and stated that she is not going to do the CT scan because her food was taken away.  Patient started eating while nurses were present in the room.  Patient accused nurse of not giving her her medication for itching.  Nurse explained that staff have to be aware that the medication is needed before bringing it in. It is an as needed medication.  Transport came to get patient for CT.  Patient refused and continued to eat.

## 2019-10-16 NOTE — Progress Notes (Signed)
PICC order received. Attempted to get consent for PICC placement. After introduction of this Pryor Curia, patient stated that "you all are heavy handed. They should know not to send a man in to start an IV. Do you all have a female to do this?" I instructed the patient that we do. I informed the patient's nurse and fellow PICC nurse, Clarene Critchley, RN of the patient's request.

## 2019-10-16 NOTE — Progress Notes (Signed)
Spoke with Estill Bamberg RN re PICC order.  States pt is agreeable at this time for PICC placement.

## 2019-10-16 NOTE — Progress Notes (Signed)
PROGRESS NOTE  Mary Frey EHO:[id number] DOB: 08/10/1975 DOA: 10/11/2019 PCP: Mora Bellman, MD  HPI/Recap of past 24 hours: HPI: Mary Frey is a 44 y.o. female with medical history significant for Graves' disease who presents to the emergency room with a 3-week history of diarrhea lower abdominal pain.  Patient states that she had been on methimazole 5 mg twice daily from 2014 up until December 2020 when she stopped taking the medication.  Over the course of the next 6 months she was initially doing well but then started losing weight and feeling very fatigued.  She contacted her endocrinologist 3 weeks ago who and some blood tests.  Patient states they told her her thyroid was very hyperactive so they were going to start her on a higher dose of methimazole 10 mg twice daily.  She said since starting the medication she progressively developed diarrhea and then lower abdominal pain.  She vomited once today.  She denied fever or chills.  Denied dysuria denied chest pains or shortness of breath.  The symptoms started after taking the medication, she stopped taking it 3 days ago but her discomfort continued.  Additionally she complained of dark-colored urine and pruritus.  ED Course: On arrival in the emergency room she was mildly tachycardic at 101 but with otherwise normal vitals.  Labs notable for markedly elevated LFTs with AST 1467, ALT 883, alk phos 200 and total bilirubin 9.5.  Compared to blood work done 3 months ago when AST was slightly elevated at 50 but with otherwise normal liver function.  Labs were mostly unremarkable.  Right upper quadrant sonogram was essentially negative.  Hospitalist consulted for admission possible methimazole hepatotoxicity.  GI following.  10/14/19:  Spoke with her endocrinologist this AM, Dr. Katheran James.  He recommended continue to hold off methimazole and PTU which are contraindicated in the setting of methimazole drug induced liver toxicity.  He last  seen her on 09/15/19 with plan for radioactive iodine ablation at [facility].  He also recommends starting a BB, atenolol, to prevent thyroid storm.  He did not think that she needs steroids or potassium iodine at this time.  The potassium iodine can affect her radioactive iodine ablation.  He is available at [phone]  For further questions.  She has an appointment scheduled with Dr. Hartford Poli on 10/21/19 at 2:40 PM.   10/16/19: Seen and examined at her bedside.  No IV access.  She has declined IV access and blood drawn.  Stating it hurts too much.  PICC line ordered to be placed for IV therapies and blood draws.    At the time of this dictation no lab results available for trending of LFTs in the setting of drug-induced liver toxicity.  No IV fluid running.  Patient counseled on the importance of compliance with medical management.  She verbalizes understanding.  Patient is alert and oriented x4.  Assessment/Plan: Principal Problem:   Abnormal LFTs Active Problems:   Graves disease   HTN (hypertension)   History of use of hepatotoxic drug   Abnormal liver enzymes  Methimazole induced liver toxicity, POA Presented with significantly elevated Abnormal LFTs in the setting of recent restart of methimazole, highly suspicious for methimazole toxicity. -Also presenting with lower abdominal discomfort and diarrhea with onset after restarting methimazole at a higher dose than her usual -Acute hepatitis panel negative.   -Continue to avoid hepatotoxic agents  -Continue IV hydration NS, rate increased to 150 cc/h  -Discussed with her endocrinologist as stated  above -GI also following. -Continue to trend liver enzymes -Needs to keep appointment scheduled for 10/21/19 at 2:40 PM with Dr. Katheran James. -Plan for radiation iodine ablation  Graves disease -Patient was restarted on methimazole 3 weeks prior for symptomatic hyperthyroidism -TSH less than 0.010, free T4 2.87. -No Endocrinology service at  [place] with her endocrinologist this AM, Dr. Katheran James.  He recommended continue to hold off methimazole and PTU which are contraindicated in the setting of methimazole drug induced liver toxicity.  He last seen her on 09/15/19 with plan for radioactive iodine ablation at [facility].  He also recommends starting a BB, atenolol, to prevent thyroid storm.  He did not think that she needs steroids or potassium iodine at this time.  The potassium iodine can affect her radioactive iodine ablation.  He is available at [phone]  For further questions.  She has an appointment scheduled with Dr. Hartford Poli on 10/21/19 at 2:40 PM.  Acute transaminitis in the setting of Methimazole liver toxicity Presented with alkaline phosphatase 200, AST 1400, ALT 800, T bilirubin 9.5 RUQ [place] neagtive LFTs still significantly elevated Continue IV fluid hydration 150 cc/h normal saline. Continue to trend LFTs  Hyperbilirubinemia with Graves' disease drug toxicity Presented with T bili of 9.5 Levels are trending down 7.8 Continue to monitor  Essential HTN Continue to hold off HCTZ Continue atenolol 12.5 mg BID Continue to monitor vital signs  Iron deficiency anemia Hg stable and trending up 11.7, MCV 70 Resume home ferrous sulfate supplement  Improving, frequent loose stools, POA Afebrile with no leukocytosis Less likely infective Continue PRN Imodium  Pruritus in the setting of liver toxicity Continue as needed Atarax and benadryl PRN Continue as needed Sarna lotion     DVT prophylaxis: Lovenox subcu daily Code Status: full code  Family Communication:  none     Status is: Inpatient   Dispo: The patient is from: Home.              Anticipated d/c is to: Home.              Anticipated d/c date is: 10/16/2019               Patient currently currently not stable for discharge due to drug induced liver toxicity requiring IV fluid hydration, and close  monitoring        Objective: Vitals:   10/15/19 1500 10/15/19 2006 10/16/19 0420 10/16/19 0848  BP: 119/80 139/79 135/68 (!) 144/93  Pulse: 70 (!) 56 62 83  Resp:  17 16 17   Temp: 98.8 F (37.1 C) 98.4 F (36.9 C) 98.5 F (36.9 C) 98.5 F (36.9 C)  TempSrc: Oral Oral Oral Oral  SpO2: 96% 97% 98% (!) 86%  Weight:      Height:       No intake or output data in the 24 hours ending 10/16/19 1223 Filed Weights   10/12/19 0939 10/12/19 0940  Weight: 62.1 kg 54.4 kg    Exam: No significant changes from prior exam.  . General: 44 y.o. year-old female thin built in no acute distress.  Alert oriented x3. .  Cardiovascular: Regular rate and rhythm no rubs or gallops.   Marland Kitchen Respiratory: Clear auscultation no wheezes or rales. . Abdomen: Soft nontender nondistended normal bowel sounds present.   . Musculoskeletal: No lower extremity edema bilaterally.   Marland Kitchen Psychiatry: Mood is appropriate for condition and setting.  Data Reviewed: CBC: Recent Labs  Lab 10/11/19 1851 10/12/19  1651 10/14/19 0420  WBC 6.6 7.1 8.0  NEUTROABS  --  4.5 4.7  HGB 13.3 11.5* 11.7*  HCT 37.5 32.3* 33.0*  MCV 71.6* 70.7* 70.8*  PLT 356 300 481   Basic Metabolic Panel: Recent Labs  Lab 10/11/19 1851 10/12/19 1651 10/14/19 0420 10/15/19 0619 10/15/19 1845  NA 134* 138 137 136 134*  K 3.9 4.1 4.3 4.5 4.6  CL 104 107 107 106 104  CO2 22 23 21* 21* 22  GLUCOSE 117* 114* 95 85 141*  BUN 9 8 9 11 11   CREATININE 0.51 0.54 0.50 0.50 0.50  CALCIUM 9.4 9.0 9.0 9.1 9.0   GFR: Estimated Creatinine Clearance: 78.7 mL/min (by C-G formula based on SCr of 0.5 mg/dL). Liver Function Tests: Recent Labs  Lab 10/11/19 1851 10/12/19 1651 10/14/19 0420 10/15/19 0619 10/15/19 1845  AST 1,467* 1,151* 1,260* 1,265* 1,391*  ALT 883* 748* 803* 861* 956*  ALKPHOS 200* 173* 167* 153* 165*  BILITOT 9.5* 7.9* 7.8* 7.7* 9.0*  PROT 8.2* 6.9 6.9 6.6 6.9  ALBUMIN 3.1* 2.6* 2.5* 2.5* 2.6*   Recent Labs  Lab  10/11/19 1851  LIPASE 22   No results for input(s): AMMONIA in the last 168 hours. Coagulation Profile: Recent Labs  Lab 10/12/19 1651  INR 1.0   Cardiac Enzymes: No results for input(s): CKTOTAL, CKMB, CKMBINDEX, TROPONINI in the last 168 hours. BNP (last 3 results) No results for input(s): PROBNP in the last 8760 hours. HbA1C: No results for input(s): HGBA1C in the last 72 hours. CBG: No results for input(s): GLUCAP in the last 168 hours. Lipid Profile: No results for input(s): CHOL, HDL, LDLCALC, TRIG, CHOLHDL, LDLDIRECT in the last 72 hours. Thyroid Function Tests: No results for input(s): TSH, T4TOTAL, FREET4, T3FREE, THYROIDAB in the last 72 hours. Anemia Panel: No results for input(s): VITAMINB12, FOLATE, FERRITIN, TIBC, IRON, RETICCTPCT in the last 72 hours. Urine analysis:    Component Value Date/Time   COLORURINE AMBER (A) 10/11/2019 1845   APPEARANCEUR HAZY (A) 10/11/2019 1845   LABSPEC 1.024 10/11/2019 1845   PHURINE 5.0 10/11/2019 1845   GLUCOSEU 50 (A) 10/11/2019 1845   HGBUR NEGATIVE 10/11/2019 1845   BILIRUBINUR MODERATE (A) 10/11/2019 1845   KETONESUR NEGATIVE 10/11/2019 1845   PROTEINUR NEGATIVE 10/11/2019 1845   UROBILINOGEN 0.2 03/30/2019 1215   NITRITE NEGATIVE 10/11/2019 1845   LEUKOCYTESUR NEGATIVE 10/11/2019 1845   Sepsis Labs: @LABRCNTIP (procalcitonin:4,lacticidven:4)  ) Recent Results (from the past 240 hour(s))  SARS Coronavirus 2 by RT PCR (hospital order, performed in [place] hospital lab) Nasopharyngeal Nasopharyngeal Swab     Status: None   Collection Time: 10/12/19  1:43 AM   Specimen: Nasopharyngeal Swab  Result Value Ref Range Status   SARS Coronavirus 2 NEGATIVE NEGATIVE Final    Comment: (NOTE) SARS-CoV-2 target nucleic acids are NOT DETECTED.  The SARS-CoV-2 RNA is generally detectable in upper and lower respiratory specimens during the acute phase of infection. The lowest concentration of SARS-CoV-2 viral copies this  assay can detect is 250 copies / mL. A negative result does not preclude SARS-CoV-2 infection and should not be used as the sole basis for treatment or other patient management decisions.  A negative result may occur with improper specimen collection / handling, submission of specimen other than nasopharyngeal swab, presence of viral mutation(s) within the areas targeted by this assay, and inadequate number of viral copies (<250 copies / mL). A negative result must be combined with clinical observations, patient history, and epidemiological information.  Fact Sheet for Patients:   StrictlyIdeas.no  Fact Sheet for Healthcare Providers: BankingDealers.co.za  This test is not yet approved or  cleared by the [place] FDA and has been authorized for detection and/or diagnosis of SARS-CoV-2 by FDA under an Emergency Use Authorization (EUA).  This EUA will remain in effect (meaning this test can be used) for the duration of the COVID-19 declaration under Section 564(b)(1) of the Act, 21 U.S.C. section 360bbb-3(b)(1), unless the authorization is terminated or revoked sooner.  Performed at [facility], [place] [address] [place], [place] [address]       Studies: [place] EKG SITE RITE  Result Date: 10/16/2019 If [facility] image not attached, placement could not be confirmed due to current cardiac rhythm.   Scheduled Meds: . atenolol  12.5 mg Oral BID  . enoxaparin (LOVENOX) injection  40 mg Subcutaneous Q24H  . ferrous sulfate  325 mg Oral Q breakfast  . gabapentin  100 mg Oral TID  . melatonin  5 mg Oral QHS  . sodium chloride flush  3 mL Intravenous Once    Continuous Infusions: . sodium chloride 150 mL/hr at 10/15/19 1715     LOS: 3 days     Kayleen Memos, MD [facility] Pager [phone]  If 7PM-7AM, please contact night-coverage [email or web address] Password [facility] 10/16/2019, 12:23 PM

## 2019-10-16 NOTE — Progress Notes (Signed)
Patient refused new IV at this time stating she wanted to rest and that the doctor had spoke to her about a PICC line this morning. Writer has asked several times about replacing the peripheral IV and patient continues to refuse. Patient also refused lovenox. Writer spoke with Dr Nevada Crane about patient refusal and Dr Nevada Crane ordered a Picc line for her at this time.

## 2019-10-17 LAB — COMPREHENSIVE METABOLIC PANEL
ALT: 962 U/L — ABNORMAL HIGH (ref 0–44)
ALT: 1068 U/L — ABNORMAL HIGH (ref 0–44)
AST: 1328 U/L — ABNORMAL HIGH (ref 15–41)
AST: 1354 U/L — ABNORMAL HIGH (ref 15–41)
Albumin: 2.4 g/dL — ABNORMAL LOW (ref 3.5–5.0)
Albumin: 2.5 g/dL — ABNORMAL LOW (ref 3.5–5.0)
Alkaline Phosphatase: 150 U/L — ABNORMAL HIGH (ref 38–126)
Alkaline Phosphatase: 167 U/L — ABNORMAL HIGH (ref 38–126)
Anion gap: 8 (ref 5–15)
Anion gap: 7 (ref 5–15)
BUN: 11 mg/dL (ref 6–20)
BUN: 9 mg/dL (ref 6–20)
CO2: 20 mmol/L — ABNORMAL LOW (ref 22–32)
CO2: 23 mmol/L (ref 22–32)
Calcium: 9 mg/dL (ref 8.9–10.3)
Calcium: 9.1 mg/dL (ref 8.9–10.3)
Chloride: 108 mmol/L (ref 98–111)
Chloride: 105 mmol/L (ref 98–111)
Creatinine, Ser: 0.59 mg/dL (ref 0.44–1.00)
Creatinine, Ser: 0.51 mg/dL (ref 0.44–1.00)
GFR calc Af Amer: >60 mL/min (ref >60)
GFR calc Af Amer: >60 mL/min (ref >60)
GFR calc non Af Amer: >60 mL/min (ref >60)
GFR calc non Af Amer: >60 mL/min (ref >60)
Glucose, Bld: 86 mg/dL (ref 70–99)
Glucose, Bld: 109 mg/dL — ABNORMAL HIGH (ref 70–99)
Potassium: 4.4 mmol/L (ref 3.5–5.1)
Potassium: 4.7 mmol/L (ref 3.5–5.1)
Sodium: 136 mmol/L (ref 135–145)
Sodium: 135 mmol/L (ref 135–145)
Total Bilirubin: 9.4 mg/dL — ABNORMAL HIGH (ref 0.3–1.2)
Total Bilirubin: 10.1 mg/dL — ABNORMAL HIGH (ref 0.3–1.2)
Total Protein: 6.5 g/dL (ref 6.5–8.1)
Total Protein: 7.1 g/dL (ref 6.5–8.1)

## 2019-10-17 NOTE — Plan of Care (Signed)
  Problem: Education: Goal: Knowledge of General Education information will improve Description: Including pain rating scale, medication(s)/side effects and non-pharmacologic comfort measures Outcome: Progressing   Problem: Health Behavior/Discharge Planning: Goal: Ability to manage health-related needs will improve Outcome: Progressing   Problem: Clinical Measurements: Goal: Will remain free from infection Outcome: Progressing   

## 2019-10-17 NOTE — Progress Notes (Signed)
PROGRESS NOTE  TRUDI MORGENTHALER ZLD:[id number] DOB: [birth year] DOA: 10/11/2019 PCP: Mora Bellman, MD  HPI/Recap of past 24 hours: HPI: ROSENDA GEFFRARD is a 44 y.o. female with medical history significant for Graves' disease who presents to the emergency room with a 3-week history of diarrhea lower abdominal pain.  Patient states that she had been on methimazole 5 mg twice daily from 2014 up until December 2020 when she stopped taking the medication.  Over the course of the next 6 months she was initially doing well but then started losing weight and feeling very fatigued.  She contacted her endocrinologist 3 weeks ago who and some blood tests.  Patient states they told her her thyroid was very hyperactive so they were going to start her on a higher dose of methimazole 10 mg twice daily.  She said since starting the medication she progressively developed diarrhea and then lower abdominal pain.  She vomited once today.  She denied fever or chills.  Denied dysuria denied chest pains or shortness of breath.  The symptoms started after taking the medication, she stopped taking it 3 days ago but her discomfort continued.  Additionally she complained of dark-colored urine and pruritus.  ED Course: On arrival in the emergency room she was mildly tachycardic at 101 but with otherwise normal vitals.  Labs notable for markedly elevated LFTs with AST 1467, ALT 883, alk phos 200 and total bilirubin 9.5.  Compared to blood work done 3 months ago when AST was slightly elevated at 50 but with otherwise normal liver function.  Labs were mostly unremarkable.  Right upper quadrant sonogram was essentially negative.  Hospitalist consulted for admission possible methimazole hepatotoxicity.  GI following.  10/14/19:  Spoke with her endocrinologist this AM, Dr. Katheran James.  He recommended continue to hold off methimazole and PTU which are contraindicated in the setting of methimazole drug induced liver toxicity.  He last  seen her on 09/15/19 with plan for radioactive iodine ablation at [facility].  He also recommends starting a BB, atenolol, to prevent thyroid storm.  He did not think that she needs steroids or potassium iodine at this time.  The potassium iodine can affect her radioactive iodine ablation.  He is available at [phone]  For further questions.  She has an appointment scheduled with Dr. Hartford Poli on 10/21/19 at 2:40 PM.   10/16/19: No IV access.  She has declined IV access and blood drawn.  Stating it hurts too much.  PICC line ordered to be placed for IV therapies and blood draws.    At the time of this dictation no lab results available for trending of LFTs in the setting of drug-induced liver toxicity.  No IV fluid running.  Patient counseled on the importance of compliance with medical management.  She verbalizes understanding.  Patient is alert and oriented x4.  Assessment/Plan: Principal Problem:   Abnormal LFTs Active Problems:   Graves disease   HTN (hypertension)   History of use of hepatotoxic drug   Abnormal liver enzymes  Methimazole induced liver toxicity, POA Presented with significantly elevated Abnormal LFTs in the setting of recent restart of methimazole, highly suspicious for methimazole toxicity. -Also presenting with lower abdominal discomfort and diarrhea with onset after restarting methimazole at a higher dose than her usual -Acute hepatitis panel negative.   -Continue to avoid hepatotoxic agents  -Continue IV hydration NS, rate increased to 200 cc/h  -Repeat CMP this afternoon at 1700.. -Continue to trend liver enzymes -Needs to  keep appointment scheduled for 10/21/19 at 2:40 PM with Dr. Katheran James. -Plan for radiation iodine ablation -Methimazole and PTU added to list of allergies  Graves disease -Patient was restarted on higher doses of methimazole 3 weeks prior for symptomatic hyperthyroidism -Presented with TSH less than 0.010, free T4 2.87. -No Endocrinology  service at [place] with her endocrinologist this AM, Dr. Katheran James.  He recommended continue to hold off methimazole and PTU which are contraindicated in the setting of methimazole drug induced liver toxicity.  He last seen her on 09/15/19 with plan for radioactive iodine ablation at [facility].  He also recommends starting a BB, atenolol, to prevent thyroid storm.  He did not think that she needs steroids or potassium iodine at this time.  The potassium iodine can affect her radioactive iodine ablation.  He is available at [phone]  For further questions.  She has an appointment scheduled with Dr. Hartford Poli on 10/21/19 at 2:40 PM.  Acute transaminitis in the setting of Methimazole liver toxicity Presented with alkaline phosphatase 200, AST 1400, ALT 800, T bilirubin 9.5 RUQ [place] negative INR and ammonia level normal LFTs trending down with IV fluids Increase rate of IV fluid to 200 cc/h Continue to trend LFTs and repeat CMP at 1700.  Hyperbilirubinemia with Graves' disease drug toxicity Presented with T bili of 9.5 Repeat level this afternoon  Essential HTN Continue to hold off HCTZ Continue atenolol 12.5 mg BID Continue to monitor vital signs  Iron deficiency anemia Hg stable and trending up 11.7, MCV 70 Resume home ferrous sulfate supplement  Improving, frequent loose stools, POA Afebrile with no leukocytosis Less likely infective Continue PRN Imodium  Pruritus in the setting of liver toxicity Continue as needed Atarax and benadryl PRN Continue as needed Sarna lotion     DVT prophylaxis: Lovenox subcu daily Code Status: full code  Family Communication:  none     Status is: Inpatient   Dispo: The patient is from: Home.              Anticipated d/c is to: Home.              Anticipated d/c date is: 10/19/2019               Patient currently currently not stable for discharge due to drug induced liver toxicity requiring IV fluid hydration, and close  monitoring.        Objective: Vitals:   10/16/19 1535 10/16/19 1952 10/17/19 0415 10/17/19 0856  BP: (!) 150/93 134/75 (!) 135/98 (!) 141/87  Pulse: 68 (!) 57 (!) 56 67  Resp: _0 Temp: 98.1 F (36.7 C) (!) 97.5 F (36.4 C) 98.5 F (36.9 C) 98.2 F (36.8 C)  TempSrc: Oral Oral Oral Oral  SpO2: 99% 100% 100% 98%  Weight:   51.4 kg   Height:       No intake or output data in the 24 hours ending 10/17/19 1309 Filed Weights   10/12/19 0939 10/12/19 0940 10/17/19 0415  Weight: 62.1 kg 54.4 kg 51.4 kg    Exam: . General: 44 y.o. year-old female thin built, jaundiced, in no acute distress.  Alert oriented x3. .  Cardiovascular: Regular rate and rhythm no rubs or gallops.   Marland Kitchen Respiratory: Clear consultation no wheezes or rales.   . Abdomen: Soft nontender mildly distended, normal bowel sounds present.   . Musculoskeletal: No lower extremity edema bilaterally. Marland Kitchen Psychiatry: Mood is appropriate for condition  and setting.  Data Reviewed: CBC: Recent Labs  Lab 10/11/19 1851 10/12/19 1651 10/14/19 0420 10/16/19 1940  WBC 6.6 7.1 8.0 9.1  NEUTROABS  --  4.5 4.7 6.0  HGB 13.3 11.5* 11.7* 10.4*  HCT 37.5 32.3* 33.0* 29.1*  MCV 71.6* 70.7* 70.8* 70.0*  PLT 356 300 314 341   Basic Metabolic Panel: Recent Labs  Lab 10/15/19 0619 10/15/19 1845 10/16/19 1245 10/16/19 1940 10/17/19 0420  NA 136 134* 135 134* 136  K 4.5 4.6 4.4 4.3 4.4  CL 106 104 103 105 108  CO2 21* _0 20*  GLUCOSE 85 141* 101* 112* 86  BUN _1 CREATININE 0.50 0.50 0.63 0.76 0.59  CALCIUM 9.1 9.0 9.4 8.9 9.0   GFR: Estimated Creatinine Clearance: 74.3 mL/min (by C-G formula based on SCr of 0.59 mg/dL). Liver Function Tests: Recent Labs  Lab 10/15/19 0619 10/15/19 1845 10/16/19 1245 10/16/19 1940 10/17/19 0420  AST 1,265* 1,391* 1,679* 1,382* 1,328*  ALT 861* 956* 1,094* 1,028* 962*  ALKPHOS 153* 165* 177* 159* 150*  BILITOT 7.7* 9.0* 10.5* 9.4* 9.4*  PROT 6.6  6.9 7.3 6.9 6.5  ALBUMIN 2.5* 2.6* 2.8* 2.5* 2.4*   Recent Labs  Lab 10/11/19 1851  LIPASE 22   Recent Labs  Lab 10/16/19 1940  AMMONIA 27   Coagulation Profile: Recent Labs  Lab 10/12/19 1651 10/16/19 1940  INR 1.0 1.1   Cardiac Enzymes: No results for input(s): CKTOTAL, CKMB, CKMBINDEX, TROPONINI in the last 168 hours. BNP (last 3 results) No results for input(s): PROBNP in the last 8760 hours. HbA1C: No results for input(s): HGBA1C in the last 72 hours. CBG: No results for input(s): GLUCAP in the last 168 hours. Lipid Profile: No results for input(s): CHOL, HDL, LDLCALC, TRIG, CHOLHDL, LDLDIRECT in the last 72 hours. Thyroid Function Tests: No results for input(s): TSH, T4TOTAL, FREET4, T3FREE, THYROIDAB in the last 72 hours. Anemia Panel: No results for input(s): VITAMINB12, FOLATE, FERRITIN, TIBC, IRON, RETICCTPCT in the last 72 hours. Urine analysis:    Component Value Date/Time   COLORURINE AMBER (A) 10/16/2019 2211   APPEARANCEUR CLEAR 10/16/2019 2211   LABSPEC 1.010 10/16/2019 2211   PHURINE 5.0 10/16/2019 2211   GLUCOSEU NEGATIVE 10/16/2019 2211   HGBUR LARGE (A) 10/16/2019 2211   BILIRUBINUR NEGATIVE 10/16/2019 2211   KETONESUR NEGATIVE 10/16/2019 2211   PROTEINUR NEGATIVE 10/16/2019 2211   UROBILINOGEN 0.2 03/30/2019 1215   NITRITE NEGATIVE 10/16/2019 2211   LEUKOCYTESUR SMALL (A) 10/16/2019 2211   Sepsis Labs: _2 (procalcitonin:4,lacticidven:4)  ) Recent Results (from the past 240 hour(s))  SARS Coronavirus 2 by RT PCR (hospital order, performed in [place] hospital lab) Nasopharyngeal Nasopharyngeal Swab     Status: None   Collection Time: 10/12/19  1:43 AM   Specimen: Nasopharyngeal Swab  Result Value Ref Range Status   SARS Coronavirus 2 NEGATIVE NEGATIVE Final    Comment: (NOTE) SARS-CoV-2 target nucleic acids are NOT DETECTED.  The SARS-CoV-2 RNA is generally detectable in upper and lower respiratory specimens during the  acute phase of infection. The lowest concentration of SARS-CoV-2 viral copies this assay can detect is 250 copies / mL. A negative result does not preclude SARS-CoV-2 infection and should not be used as the sole basis for treatment or other patient management decisions.  A negative result may occur with improper specimen collection / handling, submission of specimen other than nasopharyngeal swab, presence of viral mutation(s) within the areas targeted by this assay,  and inadequate number of viral copies (<250 copies / mL). A negative result must be combined with clinical observations, patient history, and epidemiological information.  Fact Sheet for Patients:   StrictlyIdeas.no  Fact Sheet for Healthcare Providers: BankingDealers.co.za  This test is not yet approved or  cleared by the [place] FDA and has been authorized for detection and/or diagnosis of SARS-CoV-2 by FDA under an Emergency Use Authorization (EUA).  This EUA will remain in effect (meaning this test can be used) for the duration of the COVID-19 declaration under Section 564(b)(1) of the Act, 21 U.S.C. section 360bbb-3(b)(1), unless the authorization is terminated or revoked sooner.  Performed at [facility], [place] [address] [place], [place] [address]       Studies: No results found.  Scheduled Meds: . atenolol  12.5 mg Oral BID  . enoxaparin (LOVENOX) injection  40 mg Subcutaneous Q24H  . ferrous sulfate  325 mg Oral Q breakfast  . gabapentin  100 mg Oral TID  . melatonin  5 mg Oral QHS  . sodium chloride flush  10-40 mL Intracatheter Q12H    Continuous Infusions: . sodium chloride 200 mL/hr at 10/17/19 0914     LOS: 4 days     Kayleen Memos, MD [facility] Pager [phone]  If 7PM-7AM, please contact night-coverage [email or web address] Password [facility] 10/17/2019, 1:09 PM

## 2019-10-18 LAB — COMPREHENSIVE METABOLIC PANEL
ALT: 945 U/L — ABNORMAL HIGH (ref 0–44)
AST: 1248 U/L — ABNORMAL HIGH (ref 15–41)
Albumin: 2.5 g/dL — ABNORMAL LOW (ref 3.5–5.0)
Alkaline Phosphatase: 155 U/L — ABNORMAL HIGH (ref 38–126)
Anion gap: 9 (ref 5–15)
BUN: 5 mg/dL — ABNORMAL LOW (ref 6–20)
CO2: 20 mmol/L — ABNORMAL LOW (ref 22–32)
Calcium: 8.7 mg/dL — ABNORMAL LOW (ref 8.9–10.3)
Chloride: 107 mmol/L (ref 98–111)
Creatinine, Ser: 0.56 mg/dL (ref 0.44–1.00)
GFR calc Af Amer: >60 mL/min (ref >60)
GFR calc non Af Amer: >60 mL/min (ref >60)
Glucose, Bld: 120 mg/dL — ABNORMAL HIGH (ref 70–99)
Potassium: 3.6 mmol/L (ref 3.5–5.1)
Sodium: 136 mmol/L (ref 135–145)
Total Bilirubin: 11.5 mg/dL — ABNORMAL HIGH (ref 0.3–1.2)
Total Protein: 6.5 g/dL (ref 6.5–8.1)

## 2019-10-18 NOTE — Progress Notes (Signed)
PROGRESS NOTE  Mary Frey:[id number] DOB: [birth year] DOA: 10/11/2019 PCP: Mora Bellman, MD  HPI/Recap of past 24 hours: HPI: Mary Frey is a 44 y.o. female with medical history significant for Graves' disease who presents to the emergency room with a 3-week history of diarrhea lower abdominal pain.  Patient states that she had been on methimazole 5 mg twice daily from 2014 up until December 2020 when she stopped taking the medication.  Over the course of the next 6 months she was initially doing well but then started losing weight and feeling very fatigued.  She contacted her endocrinologist 3 weeks ago who and some blood tests.  Patient states they told her her thyroid was very hyperactive so they were going to start her on a higher dose of methimazole 10 mg twice daily.  She said since starting the medication she progressively developed diarrhea and then lower abdominal pain.  She vomited once today.  She denied fever or chills.  Denied dysuria denied chest pains or shortness of breath.  The symptoms started after taking the medication, she stopped taking it 3 days ago but her discomfort continued.  Additionally she complained of dark-colored urine and pruritus.  ED Course: On arrival in the emergency room she was mildly tachycardic at 101 but with otherwise normal vitals.  Labs notable for markedly elevated LFTs with AST 1467, ALT 883, alk phos 200 and total bilirubin 9.5.  Compared to blood work done 3 months ago when AST was slightly elevated at 50 but with otherwise normal liver function.  Labs were mostly unremarkable.  Right upper quadrant sonogram was essentially negative.  Hospitalist consulted for admission possible methimazole hepatotoxicity.  GI following.  10/14/19:  Spoke with her endocrinologist this AM, Dr. Katheran James.  He recommended continue to hold off methimazole and PTU which are contraindicated in the setting of methimazole drug induced liver toxicity.  He last  seen her on 09/15/19 with plan for radioactive iodine ablation at [facility].  He also recommends starting a BB, atenolol, to prevent thyroid storm.  He did not think that she needs steroids or potassium iodine at this time.  The potassium iodine can affect her radioactive iodine ablation.  He is available at [phone]  For further questions.  She has an appointment scheduled with Dr. Hartford Poli on 10/21/19 at 2:40 PM.   10/16/19: No IV access.  She has declined IV access and blood drawn.  Stating it hurts too much.  PICC line ordered to be placed for IV therapies and blood draws.    At the time of this dictation no lab results available for trending of LFTs in the setting of drug-induced liver toxicity.  No IV fluid running.  Patient counseled on the importance of compliance with medical management.  She verbalizes understanding.  Patient is alert and oriented x4.  10/18/19: Seen and examined.  No new complaints.  Urine is clearing out.  LFTs are trending down except T bili which is uptrending.  Will obtain CT abdomen and pelvis.  Patient is agreeable to get it done tomorrow but not today.  Assessment/Plan: Principal Problem:   Abnormal LFTs Active Problems:   Graves disease   HTN (hypertension)   History of use of hepatotoxic drug   Abnormal liver enzymes  Methimazole induced liver toxicity, POA Presented with significantly elevated Abnormal LFTs in the setting of recent restart of methimazole, highly suspicious for methimazole toxicity. -Also presenting with lower abdominal discomfort and diarrhea with onset after  restarting methimazole at a higher dose than her usual -Acute hepatitis panel negative.   -Continue to avoid hepatotoxic agents  -Continue IV hydration NS, rate 150cc/h  -Repeat CMP this afternoon at 1700.. -Continue to trend liver enzymes -Needs to keep appointment scheduled for 10/21/19 at 2:40 PM with Dr. Katheran James. -Plan for radiation iodine ablation -Methimazole and PTU  added to list of allergies  Graves disease -Patient was restarted on higher doses of methimazole 3 weeks prior for symptomatic hyperthyroidism -Presented with TSH less than 0.010, free T4 2.87. -No Endocrinology service at [facility] with her endocrinologist this AM, Dr. Katheran James.  He recommended continue to hold off methimazole and PTU which are contraindicated in the setting of methimazole drug induced liver toxicity.  He last seen her on 09/15/19 with plan for radioactive iodine ablation at [facility].  He also recommends starting a BB, atenolol, to prevent thyroid storm.  He did not think that she needs steroids or potassium iodine at this time.  The potassium iodine can affect her radioactive iodine ablation.  He is available at [phone]  For further questions.  She has an appointment scheduled with Dr. Hartford Poli on 10/21/19 at 2:40 PM.  Acute transaminitis in the setting of Methimazole liver toxicity Presented with alkaline phosphatase 200, AST 1400, ALT 800, T bilirubin 9.5 RUQ [place] negative INR and ammonia level normal LFTs trending down with IV fluids Currently 150 cc/h of normal saline Continue to trend LFTs  Hyperbilirubinemia with Graves' disease drug toxicity Presented with T bili of 9.5> 11.5 Obtain CT abdomen and pelvis, patient agreeable to get it done tomorrow not today.  Essential HTN Continue to hold off HCTZ Continue atenolol 12.5 mg BID Continue to monitor vital signs  Iron deficiency anemia Hg stable and trending up 11.7, MCV 70 Resume home ferrous sulfate supplement  Improving, frequent loose stools, POA Afebrile with no leukocytosis Less likely infective Continue PRN Imodium  Improved pruritus in the setting of liver toxicity Continue as needed Atarax and benadryl PRN Continue as needed Sarna lotion  Abdominal distention in the setting of large fibroid Monitor Follow CT scan results      DVT prophylaxis: Lovenox subcu daily Code  Status: full code  Family Communication:  none     Status is: Inpatient   Dispo: The patient is from: Home.              Anticipated d/c is to: Home.              Anticipated d/c date is: 10/20/2019               Patient currently currently not stable for discharge due to drug induced liver toxicity requiring IV fluid hydration, and close monitoring.        Objective: Vitals:   10/17/19 1831 10/17/19 2059 10/17/19 2241 10/18/19 0442  BP: (!) 159/91 (!) 154/63 (!) 146/87 139/83  Pulse: 66 71  63  Resp: 18 16  15   Temp: 98.3 F (36.8 C) 98.9 F (37.2 C)  98.5 F (36.9 C)  TempSrc: Oral   Oral  SpO2: 100% 100%  100%  Weight:    54.5 kg  Height:       No intake or output data in the 24 hours ending 10/18/19 1320 Filed Weights   10/12/19 0940 10/17/19 0415 10/18/19 0442  Weight: 54.4 kg 51.4 kg 54.5 kg    Exam: . General: 44 y.o. year-old female thin built jaundice in no  acute distress.  Alert oriented x3. .  Cardiovascular: Regular rate and rhythm no rubs or gallops.   Marland Kitchen Respiratory: Clear to auscultation no wheezes or rales. . Abdomen: Distended nontender normal bowel sounds present.   . Musculoskeletal: No lower extremity edema bilaterally.   Marland Kitchen Psychiatry: Mood is appropriate for condition and setting.  Data Reviewed: CBC: Recent Labs  Lab 10/11/19 1851 10/12/19 1651 10/14/19 0420 10/16/19 1940  WBC 6.6 7.1 8.0 9.1  NEUTROABS  --  4.5 4.7 6.0  HGB 13.3 11.5* 11.7* 10.4*  HCT 37.5 32.3* 33.0* 29.1*  MCV 71.6* 70.7* 70.8* 70.0*  PLT 356 300 314 756   Basic Metabolic Panel: Recent Labs  Lab 10/16/19 1245 10/16/19 1940 10/17/19 0420 10/17/19 2035 10/18/19 1211  NA 135 134* 136 135 136  K 4.4 4.3 4.4 4.7 3.6  CL 103 105 108 105 107  CO2 23 22 20* 23 20*  GLUCOSE 101* 112* 86 109* 120*  BUN 10 14 11 9  5*  CREATININE 0.63 0.76 0.59 0.51 0.56  CALCIUM 9.4 8.9 9.0 9.1 8.7*   GFR: Estimated Creatinine Clearance: 78.8 mL/min (by C-G formula based on  SCr of 0.56 mg/dL). Liver Function Tests: Recent Labs  Lab 10/16/19 1245 10/16/19 1940 10/17/19 0420 10/17/19 2035 10/18/19 1211  AST 1,679* 1,382* 1,328* 1,354* 1,248*  ALT 1,094* 1,028* 962* 1,068* 945*  ALKPHOS 177* 159* 150* 167* 155*  BILITOT 10.5* 9.4* 9.4* 10.1* 11.5*  PROT 7.3 6.9 6.5 7.1 6.5  ALBUMIN 2.8* 2.5* 2.4* 2.5* 2.5*   Recent Labs  Lab 10/11/19 1851  LIPASE 22   Recent Labs  Lab 10/16/19 1940  AMMONIA 27   Coagulation Profile: Recent Labs  Lab 10/12/19 1651 10/16/19 1940  INR 1.0 1.1   Cardiac Enzymes: No results for input(s): CKTOTAL, CKMB, CKMBINDEX, TROPONINI in the last 168 hours. BNP (last 3 results) No results for input(s): PROBNP in the last 8760 hours. HbA1C: No results for input(s): HGBA1C in the last 72 hours. CBG: No results for input(s): GLUCAP in the last 168 hours. Lipid Profile: No results for input(s): CHOL, HDL, LDLCALC, TRIG, CHOLHDL, LDLDIRECT in the last 72 hours. Thyroid Function Tests: No results for input(s): TSH, T4TOTAL, FREET4, T3FREE, THYROIDAB in the last 72 hours. Anemia Panel: No results for input(s): VITAMINB12, FOLATE, FERRITIN, TIBC, IRON, RETICCTPCT in the last 72 hours. Urine analysis:    Component Value Date/Time   COLORURINE AMBER (A) 10/16/2019 2211   APPEARANCEUR CLEAR 10/16/2019 2211   LABSPEC 1.010 10/16/2019 2211   PHURINE 5.0 10/16/2019 2211   GLUCOSEU NEGATIVE 10/16/2019 2211   HGBUR LARGE (A) 10/16/2019 2211   BILIRUBINUR NEGATIVE 10/16/2019 2211   KETONESUR NEGATIVE 10/16/2019 2211   PROTEINUR NEGATIVE 10/16/2019 2211   UROBILINOGEN 0.2 03/30/2019 1215   NITRITE NEGATIVE 10/16/2019 2211   LEUKOCYTESUR SMALL (A) 10/16/2019 2211   Sepsis Labs: @LABRCNTIP (procalcitonin:4,lacticidven:4)  ) Recent Results (from the past 240 hour(s))  SARS Coronavirus 2 by RT PCR (hospital order, performed in [place] hospital lab) Nasopharyngeal Nasopharyngeal Swab     Status: None   Collection Time:  10/12/19  1:43 AM   Specimen: Nasopharyngeal Swab  Result Value Ref Range Status   SARS Coronavirus 2 NEGATIVE NEGATIVE Final    Comment: (NOTE) SARS-CoV-2 target nucleic acids are NOT DETECTED.  The SARS-CoV-2 RNA is generally detectable in upper and lower respiratory specimens during the acute phase of infection. The lowest concentration of SARS-CoV-2 viral copies this assay can detect is 250 copies / mL.  A negative result does not preclude SARS-CoV-2 infection and should not be used as the sole basis for treatment or other patient management decisions.  A negative result may occur with improper specimen collection / handling, submission of specimen other than nasopharyngeal swab, presence of viral mutation(s) within the areas targeted by this assay, and inadequate number of viral copies (<250 copies / mL). A negative result must be combined with clinical observations, patient history, and epidemiological information.  Fact Sheet for Patients:   StrictlyIdeas.no  Fact Sheet for Healthcare Providers: BankingDealers.co.za  This test is not yet approved or  cleared by the [place] FDA and has been authorized for detection and/or diagnosis of SARS-CoV-2 by FDA under an Emergency Use Authorization (EUA).  This EUA will remain in effect (meaning this test can be used) for the duration of the COVID-19 declaration under Section 564(b)(1) of the Act, 21 U.S.C. section 360bbb-3(b)(1), unless the authorization is terminated or revoked sooner.  Performed at [facility], [place] [address] [place], [place] [address]       Studies: No results found.  Scheduled Meds: . atenolol  12.5 mg Oral BID  . enoxaparin (LOVENOX) injection  40 mg Subcutaneous Q24H  . ferrous sulfate  325 mg Oral Q breakfast  . gabapentin  100 mg Oral TID  . melatonin  5 mg Oral QHS  . sodium chloride flush  10-40 mL Intracatheter Q12H    Continuous  Infusions: . sodium chloride 150 mL/hr at 10/18/19 0909     LOS: 5 days     Kayleen Memos, MD [facility] Pager [phone]  If 7PM-7AM, please contact night-coverage [email or web address] Password [email or web address] 10/18/2019, 1:20 PM

## 2019-10-18 NOTE — Plan of Care (Signed)

## 2019-10-18 NOTE — Progress Notes (Signed)
Pt refused lab draw @ this time. Pt requested labs to be drawn on day shift. Bedside RN notified.  Randol Kern, RN VAST

## 2019-10-19 ENCOUNTER — Inpatient Hospital Stay (HOSPITAL_COMMUNITY): Payer: Medicaid - Out of State

## 2019-10-19 ENCOUNTER — Ambulatory Visit: Payer: Medicaid - Out of State

## 2019-10-19 ENCOUNTER — Inpatient Hospital Stay: Payer: Medicaid - Out of State

## 2019-10-19 ENCOUNTER — Inpatient Hospital Stay: Payer: Medicaid - Out of State | Admitting: Adult Health

## 2019-10-19 LAB — COMPREHENSIVE METABOLIC PANEL
ALT: 950 U/L — ABNORMAL HIGH (ref 0–44)
AST: 1181 U/L — ABNORMAL HIGH (ref 15–41)
Albumin: 2.6 g/dL — ABNORMAL LOW (ref 3.5–5.0)
Alkaline Phosphatase: 153 U/L — ABNORMAL HIGH (ref 38–126)
Anion gap: 10 (ref 5–15)
BUN: 8 mg/dL (ref 6–20)
CO2: 21 mmol/L — ABNORMAL LOW (ref 22–32)
Calcium: 9.1 mg/dL (ref 8.9–10.3)
Chloride: 105 mmol/L (ref 98–111)
Creatinine, Ser: 0.73 mg/dL (ref 0.44–1.00)
GFR calc Af Amer: >60 mL/min (ref >60)
GFR calc non Af Amer: >60 mL/min (ref >60)
Glucose, Bld: 87 mg/dL (ref 70–99)
Potassium: 3.9 mmol/L (ref 3.5–5.1)
Sodium: 136 mmol/L (ref 135–145)
Total Bilirubin: 11.3 mg/dL — ABNORMAL HIGH (ref 0.3–1.2)
Total Protein: 6.8 g/dL (ref 6.5–8.1)

## 2019-10-19 NOTE — Progress Notes (Signed)
Patient refused CT this morning, request to speak to the MD this morning before going forward.  Patient would also like to discuss a higher dosage of melatonin at night or something else to help her sleep.

## 2019-10-19 NOTE — Progress Notes (Signed)
 PROGRESS NOTE  Mary Frey MRN:[id number] DOB: 04/13/1976 DOA: 10/11/2019 PCP: Constant, [name], MD  HPI/Recap of past 24 hours: HPI: Mary Frey is a 44 y.o. female with medical history significant for Graves' disease who presents to the emergency room with a 3-week history of diarrhea associated with lower abdominal pain.  She had been on methimazole 5 mg twice daily from 2014 up until December 2020 when she stopped taking the medication.  Over the course of the next 6 months she was initially doing well but then started losing weight and feeling very fatigued.  She contacted her endocrinologist 3 weeks prior to her presentation.  Patient states they told her that her thyroid was very hyperactive so they started her on a higher dose of methimazole 10 mg twice daily.  She said since starting the medication she progressively developed diarrhea and then lower abdominal pain.  She vomited once on the day of presentation.  She denied fever or chills.  Denied dysuria denied chest pains or shortness of breath.  The symptoms started after taking the medication, she stopped taking it 3 days prior to coming to the ED but her discomfort continued.  Additionally she complained of dark-colored urine and pruritus.  ED Course: Labs notable for markedly elevated LFTs with AST 1467, ALT 883, alk phos 200 and total bilirubin 9.5.  Compared to blood work done 3 months prior when AST was slightly elevated at 50 but with otherwise normal liver function tests.  Right upper quadrant sonogram was essentially negative.  Hospitalist consulted for admission due to possible methimazole hepatotoxicity.  GI consulted and signed off.  No endocrinology service at [facility].  10/14/19:  Spoke with her endocrinologist, Dr. Levy Matthews.  He recommended continue to hold off methimazole and PTU which are contraindicated in the setting of methimazole drug induced liver toxicity.  He last seen her on 09/15/19 with plan  for radioactive iodine ablation at [facility].  He also recommends starting a BB, atenolol, to prevent thyroid storm.  He did not think that she needs steroids or potassium iodine at this time.  The potassium iodine can affect her radioactive iodine ablation.  He is available at [phone]  For further questions.  She has an appointment scheduled with Dr. Levy on 10/21/19 at 2:40 PM.   10/16/19: No IV access.  She has declined IV access and blood drawn.  Stating it hurts too much.  PICC line ordered to be placed for IV therapies and blood draws.    At the time of this dictation no lab results available for trending of LFTs in the setting of drug-induced liver toxicity.  No IV fluid running.  Patient counseled on the importance of compliance with medical management.  She verbalizes understanding.  Patient is alert and oriented x4.  10/18/19:T bili is uptrending.  She declines CT abdomen and pelvis.    10/19/19: Seen and examined.  She states her diarrhea has improved.  Agreeable to obtaining complete abdominal ultrasound.  No new complaints.  Continue to trend LFTs.  Continue IV fluid.    Assessment/Plan: Principal Problem:   Abnormal LFTs Active Problems:   Graves disease   HTN (hypertension)   History of use of hepatotoxic drug   Abnormal liver enzymes  Methimazole induced liver toxicity, POA Presented with significantly elevated Abnormal LFTs in the setting of recent restart of methimazole, highly suspicious for methimazole toxicity. -Also presented with lower abdominal discomfort and diarrhea with onset after restarting methimazole at   a higher dose than her usual -Acute hepatitis panel negative.   -Continue to avoid hepatotoxic agents  -Continue IV hydration NS, rate 150cc/h  -Repeat CMP daily -Continue to trend liver enzymes -Needs to keep appointment scheduled for 10/21/19 at 2:40 PM with Dr. Levy Matthews. -Plan for radiation iodine ablation outpatient -Methimazole and PTU added  to list of allergies  Graves disease -Patient was restarted on higher doses of methimazole 3 weeks prior for symptomatic hyperthyroidism -Presented with TSH less than 0.010, free T4 2.87. -No Endocrinology service at [facility].  -Spoke with her endocrinologist this AM, Dr. Levy Matthews.  He recommended continue to hold off methimazole and PTU which are contraindicated in the setting of methimazole drug induced liver toxicity.  He last seen her on 09/15/19 with plan for radioactive iodine ablation at [facility].  He also recommends starting a BB, atenolol, to prevent thyroid storm.  He did not think that she needs steroids or potassium iodine at this time.  The potassium iodine can affect her radioactive iodine ablation.  He is available at [phone]  For further questions.  She has an appointment scheduled with Dr. Levy on 10/21/19 at 2:40 PM.  Acute transaminitis in the setting of Methimazole liver toxicity Presented with alkaline phosphatase 200, AST 1400, ALT 800, T bilirubin 9.5 RUQ [place] negative INR and ammonia level normal LFTs trending down with IV fluids Currently 150 cc/h of normal saline Continue to trend LFTs  Hyperbilirubinemia with Graves' disease drug toxicity Presented with T bili of 9.5> 11.5 Declined CT abdomen pelvis, states due to risks of radiation exposure. Obtain complete abdominal ultrasound, follow results  Essential HTN Blood pressure at goal Continue to hold off HCTZ Continue atenolol 12.5 mg BID Continue to monitor vital signs  Iron deficiency anemia Continue home ferrous sulfate supplement  Improving, frequent loose stools, POA Afebrile with no leukocytosis Less likely infective Continue PRN Imodium  Improved pruritus in the setting of liver toxicity Continue as needed Atarax and benadryl PRN Continue as needed Sarna lotion  Abdominal distention  Reports history of large fibroids Continue to monitor Follow results of complete abdominal  ultrasound     DVT prophylaxis: Lovenox subcu daily Code Status: full code  Family Communication:  none at bedside    Status is: Inpatient   Dispo: The patient is from: Home.              Anticipated d/c is to: Home.              Anticipated d/c date is: 10/20/2019               Patient currently currently not stable for discharge due to drug induced liver toxicity requiring IV fluid hydration, and close monitoring.        Objective: Vitals:   10/18/19 1500 10/18/19 2011 10/19/19 0531 10/19/19 0758  BP: (!) 147/81 (!) 149/81 135/78 136/79  Pulse: 62 (!) 57 62 66  Resp: 18 18 18 17  Temp: 97.9 F (36.6 C) 98.8 F (37.1 C) 98.7 F (37.1 C) 98.5 F (36.9 C)  TempSrc: Oral Oral Oral Oral  SpO2: 100% 100% 100% 100%  Weight:   54.8 kg   Height:        Intake/Output Summary (Last 24 hours) at 10/19/2019 1337 Last data filed at 10/19/2019 0900 Gross per 24 hour  Intake 732.17 ml  Output 200 ml  Net 532.17 ml   Filed Weights   10/17/19 0415 10/18/19 0442 10/19/19 0531    Weight: 51.4 kg 54.5 kg 54.8 kg    Exam: . General: 44 y.o. year-old female thin built jaundice in no acute distress.  Alert oriented x3. .  Cardiovascular: Regular rate and rhythm no rubs or gallops. Marland Kitchen Respiratory: Clear to auscultation no wheeze no rales.   . Abdomen: Mildly distended nontender bowel sounds present.   . Musculoskeletal: No lower extremity edema bilaterally. Marland Kitchen Psychiatry: Mood is appropriate for condition and setting.   Data Reviewed: CBC: Recent Labs  Lab 10/12/19 1651 10/14/19 0420 10/16/19 1940  WBC 7.1 8.0 9.1  NEUTROABS 4.5 4.7 6.0  HGB 11.5* 11.7* 10.4*  HCT 32.3* 33.0* 29.1*  MCV 70.7* 70.8* 70.0*  PLT 300 314 762   Basic Metabolic Panel: Recent Labs  Lab 10/16/19 1940 10/17/19 0420 10/17/19 2035 10/18/19 1211 10/19/19 0429  NA 134* 136 135 136 136  K 4.3 4.4 4.7 3.6 3.9  CL 105 108 105 107 105  CO2 22 20* 23 20* 21*  GLUCOSE 112* 86 109* 120* 87    BUN _0 5* 8  CREATININE 0.76 0.59 0.51 0.56 0.73  CALCIUM 8.9 9.0 9.1 8.7* 9.1   GFR: Estimated Creatinine Clearance: 79.3 mL/min (by C-G formula based on SCr of 0.73 mg/dL). Liver Function Tests: Recent Labs  Lab 10/16/19 1940 10/17/19 0420 10/17/19 2035 10/18/19 1211 10/19/19 0429  AST 1,382* 1,328* 1,354* 1,248* 1,181*  ALT 1,028* 962* 1,068* 945* 950*  ALKPHOS 159* 150* 167* 155* 153*  BILITOT 9.4* 9.4* 10.1* 11.5* 11.3*  PROT 6.9 6.5 7.1 6.5 6.8  ALBUMIN 2.5* 2.4* 2.5* 2.5* 2.6*   No results for input(s): LIPASE, AMYLASE in the last 168 hours. Recent Labs  Lab 10/16/19 1940  AMMONIA 27   Coagulation Profile: Recent Labs  Lab 10/12/19 1651 10/16/19 1940  INR 1.0 1.1   Cardiac Enzymes: No results for input(s): CKTOTAL, CKMB, CKMBINDEX, TROPONINI in the last 168 hours. BNP (last 3 results) No results for input(s): PROBNP in the last 8760 hours. HbA1C: No results for input(s): HGBA1C in the last 72 hours. CBG: No results for input(s): GLUCAP in the last 168 hours. Lipid Profile: No results for input(s): CHOL, HDL, LDLCALC, TRIG, CHOLHDL, LDLDIRECT in the last 72 hours. Thyroid Function Tests: No results for input(s): TSH, T4TOTAL, FREET4, T3FREE, THYROIDAB in the last 72 hours. Anemia Panel: No results for input(s): VITAMINB12, FOLATE, FERRITIN, TIBC, IRON, RETICCTPCT in the last 72 hours. Urine analysis:    Component Value Date/Time   COLORURINE AMBER (A) 10/16/2019 2211   APPEARANCEUR CLEAR 10/16/2019 2211   LABSPEC 1.010 10/16/2019 2211   PHURINE 5.0 10/16/2019 2211   GLUCOSEU NEGATIVE 10/16/2019 2211   HGBUR LARGE (A) 10/16/2019 2211   BILIRUBINUR NEGATIVE 10/16/2019 2211   KETONESUR NEGATIVE 10/16/2019 2211   PROTEINUR NEGATIVE 10/16/2019 2211   UROBILINOGEN 0.2 03/30/2019 1215   NITRITE NEGATIVE 10/16/2019 2211   LEUKOCYTESUR SMALL (A) 10/16/2019 2211   Sepsis Labs: _1 (procalcitonin:4,lacticidven:4)  ) Recent Results (from the  past 240 hour(s))  SARS Coronavirus 2 by RT PCR (hospital order, performed in [facility] hospital lab) Nasopharyngeal Nasopharyngeal Swab     Status: None   Collection Time: 10/12/19  1:43 AM   Specimen: Nasopharyngeal Swab  Result Value Ref Range Status   SARS Coronavirus 2 NEGATIVE NEGATIVE Final    Comment: (NOTE) SARS-CoV-2 target nucleic acids are NOT DETECTED.  The SARS-CoV-2 RNA is generally detectable in upper and lower respiratory specimens during the acute phase of infection. The lowest concentration of  SARS-CoV-2 viral copies this assay can detect is 250 copies / mL. A negative result does not preclude SARS-CoV-2 infection and should not be used as the sole basis for treatment or other patient management decisions.  A negative result may occur with improper specimen collection / handling, submission of specimen other than nasopharyngeal swab, presence of viral mutation(s) within the areas targeted by this assay, and inadequate number of viral copies (<250 copies / mL). A negative result must be combined with clinical observations, patient history, and epidemiological information.  Fact Sheet for Patients:   StrictlyIdeas.no  Fact Sheet for Healthcare Providers: BankingDealers.co.za  This test is not yet approved or  cleared by the [place] FDA and has been authorized for detection and/or diagnosis of SARS-CoV-2 by FDA under an Emergency Use Authorization (EUA).  This EUA will remain in effect (meaning this test can be used) for the duration of the COVID-19 declaration under Section 564(b)(1) of the Act, 21 U.S.C. section 360bbb-3(b)(1), unless the authorization is terminated or revoked sooner.  Performed at [facility], [place] [address], [place], [place] [address]       Studies: No results found.  Scheduled Meds: . atenolol  12.5 mg Oral BID  . enoxaparin (LOVENOX) injection  40 mg Subcutaneous Q24H  .  ferrous sulfate  325 mg Oral Q breakfast  . gabapentin  100 mg Oral TID  . melatonin  5 mg Oral QHS  . sodium chloride flush  10-40 mL Intracatheter Q12H    Continuous Infusions: . sodium chloride 150 mL/hr at 10/19/19 0540     LOS: 6 days     Kayleen Memos, MD [facility] Pager [phone]  If 7PM-7AM, please contact night-coverage [email or web address] Password [facility] 10/19/2019, 1:37 PM

## 2019-10-19 NOTE — Plan of Care (Signed)

## 2019-10-19 NOTE — Plan of Care (Signed)

## 2019-10-20 DIAGNOSIS — R748 Abnormal levels of other serum enzymes: Secondary | ICD-10-CM

## 2019-10-20 DIAGNOSIS — I10 Essential (primary) hypertension: Secondary | ICD-10-CM

## 2019-10-20 LAB — CBC WITH DIFFERENTIAL/PLATELET
Abs Immature Granulocytes: 0.04 10*3/uL (ref 0.00–0.07)
Basophils Absolute: 0 10*3/uL (ref 0.0–0.1)
Basophils Relative: 0 %
Eosinophils Absolute: 0.2 10*3/uL (ref 0.0–0.5)
Eosinophils Relative: 3 %
HCT: 29.2 % — ABNORMAL LOW (ref 36.0–46.0)
Hemoglobin: 10.7 g/dL — ABNORMAL LOW (ref 12.0–15.0)
Immature Granulocytes: 1 %
Lymphocytes Relative: 25 %
Lymphs Abs: 2 10*3/uL (ref 0.7–4.0)
MCH: 25.4 pg — ABNORMAL LOW (ref 26.0–34.0)
MCHC: 36.6 g/dL — ABNORMAL HIGH (ref 30.0–36.0)
MCV: 69.2 fL — ABNORMAL LOW (ref 80.0–100.0)
Monocytes Absolute: 0.7 10*3/uL (ref 0.1–1.0)
Monocytes Relative: 9 %
Neutro Abs: 5 10*3/uL (ref 1.7–7.7)
Neutrophils Relative %: 62 %
Platelets: 378 10*3/uL (ref 150–400)
RBC: 4.22 MIL/uL (ref 3.87–5.11)
RDW: 17.5 % — ABNORMAL HIGH (ref 11.5–15.5)
WBC: 8 10*3/uL (ref 4.0–10.5)
nRBC: 0 % (ref 0.0–0.2)

## 2019-10-20 LAB — COMPREHENSIVE METABOLIC PANEL
ALT: 868 U/L — ABNORMAL HIGH (ref 0–44)
AST: 1030 U/L — ABNORMAL HIGH (ref 15–41)
Albumin: 2.5 g/dL — ABNORMAL LOW (ref 3.5–5.0)
Alkaline Phosphatase: 154 U/L — ABNORMAL HIGH (ref 38–126)
Anion gap: 7 (ref 5–15)
BUN: 8 mg/dL (ref 6–20)
CO2: 22 mmol/L (ref 22–32)
Calcium: 9.3 mg/dL (ref 8.9–10.3)
Chloride: 108 mmol/L (ref 98–111)
Creatinine, Ser: 0.51 mg/dL (ref 0.44–1.00)
GFR calc Af Amer: >60 mL/min (ref >60)
GFR calc non Af Amer: >60 mL/min (ref >60)
Glucose, Bld: 88 mg/dL (ref 70–99)
Potassium: 3.8 mmol/L (ref 3.5–5.1)
Sodium: 137 mmol/L (ref 135–145)
Total Bilirubin: 13.6 mg/dL — ABNORMAL HIGH (ref 0.3–1.2)
Total Protein: 7.3 g/dL (ref 6.5–8.1)

## 2019-10-20 NOTE — Plan of Care (Signed)

## 2019-10-20 NOTE — Progress Notes (Signed)
PROGRESS NOTE        PATIENT DETAILS Name: Mary Frey Age: 44 y.o. Sex: female Date of Birth: [birth year] Admit Date: 10/11/2019 Admitting Physician Kayleen Memos, DO ZDG:LOVFIEPP, [name], MD  Brief Narrative: Patient is a 44 y.o. female with history of Graves' disease-stopped taking methimazole December 2020-subsequently started having symptomatic hypothyroidism-and was restarted on methimazole-presented to the ED with lower abdominal pain, diarrhea-found to have hepatitis with jaundice-thought to be from methimazole toxicity and admitted to the hospitalist service..  Significant events: 6/21>> admit to [facility] for hepatitis/jaundice  Significant studies: 6/21>> RUQ ultrasound: Negative right upper quadrant abdominal ultrasound 6/29>> abdominal ultrasound complete: Contracted gallbladder with mild wall thickening.  Antimicrobial therapy: None  Microbiology data: None  Procedures : None  Consults: GI  DVT Prophylaxis : enoxaparin (LOVENOX) injection 40 mg Start: 10/12/19 1000  Subjective: Mild pruritus-but does not have chest pain or shortness of breath.  Assessment/Plan: Acute hepatitis with jaundice: Thought to be from methimazole toxicity.  Continues to have significant LFT elevation-however apart from mild pruritus-she is relatively asymptomatic.Acute hepatitis serology negative.  AMA, ASMA negative.  IgM EBV and IgM CMV negative, however IgM HSV 1 +ve.  Spoke with ID MD-Dr. 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  Spoke with GI team-they are recommending adding on a HSV PCR with a.m. labs-they will reevaluate tomorrow morning and provide further recommendations.  Positive ANA and SSA antibody, IgG: SSA titer mildly elevated-however dsDNA negative.  Will require outpatient follow-up with rheumatology.    Hyperthyroidism: Due to  significantly elevated LFTs-methimazole on hold.  Remains on atenolol.  Patient has a appointment scheduled with her outpatient endocrinologist on 10/21/2019 at 2:40 PM.  HTN: BP controlled-continue atenolol.  History of iron deficiency anemia: Continue iron supplementation  Diet: Diet Order            Diet regular Room service appropriate? Yes; Fluid consistency: Thin  Diet effective now                 Code Status: Full code  Family Communication: None at bedside  Disposition Plan: Status is: Inpatient  Remains inpatient appropriate because:Inpatient level of care appropriate due to severity of illness  Dispo:  Patient From: Home  Planned Disposition: Home  Expected discharge date: 10/20/19  Medically stable for discharge: No   Barriers to Discharge: Significantly elevated LFTs  Antimicrobial agents: Anti-infectives (From admission, onward)   None     Time spent: 25- minutes-Greater than 50% of this time was spent in counseling, explanation of diagnosis, planning of further management, and coordination of care.  MEDICATIONS: Scheduled Meds: . atenolol  12.5 mg Oral BID  . enoxaparin (LOVENOX) injection  40 mg Subcutaneous Q24H  . ferrous sulfate  325 mg Oral Q breakfast  . gabapentin  100 mg Oral TID  . melatonin  5 mg Oral QHS  . sodium chloride flush  10-40 mL Intracatheter Q12H   Continuous Infusions: . sodium chloride 150 mL/hr at 10/19/19 0540   PRN Meds:.camphor-menthol, diphenhydrAMINE, hydrOXYzine, ibuprofen, loperamide, ondansetron **OR** ondansetron (ZOFRAN) IV, polyvinyl alcohol, sodium chloride flush   PHYSICAL EXAM: Vital signs: Vitals:   10/19/19 1351 10/19/19 2000 10/20/19 0355 10/20/19 0815  BP: 134/82 (!) 149/81 (!) 163/91 140/90  Pulse: 82 62 69 70  Resp: 17  16 16 18   Temp: 98.6 F (37 C) 98.6 F (37 C) 98.1 F (36.7 C) (!) 97.4 F (36.3 C)  TempSrc: Oral Oral Oral Oral  SpO2: 99% 100% 91% 100%  Weight:   56.4 kg   Height:        Filed Weights   10/18/19 0442 10/19/19 0531 10/20/19 0355  Weight: 54.5 kg 54.8 kg 56.4 kg   Body mass index is 20.69 kg/m.   Gen Exam:Alert awake-not in any distress HEENT:atraumatic, normocephalic Chest: B/L clear to auscultation anteriorly CVS:S1S2 regular Abdomen:soft non tender, non distended Extremities:no edema Neurology: Non focal Skin: no rash  I have personally reviewed following labs and imaging studies  LABORATORY DATA: CBC: Recent Labs  Lab 10/14/19 0420 10/16/19 1940 10/20/19 0500  WBC 8.0 9.1 8.0  NEUTROABS 4.7 6.0 5.0  HGB 11.7* 10.4* 10.7*  HCT 33.0* 29.1* 29.2*  MCV 70.8* 70.0* 69.2*  PLT 314 361 035    Basic Metabolic Panel: Recent Labs  Lab 10/17/19 0420 10/17/19 2035 10/18/19 1211 10/19/19 0429 10/20/19 0500  NA 136 135 136 136 137  K 4.4 4.7 3.6 3.9 3.8  CL 108 105 107 105 108  CO2 20* 23 20* 21* 22  GLUCOSE 86 109* 120* 87 88  BUN 11 9 5* 8 8  CREATININE 0.59 0.51 0.56 0.73 0.51  CALCIUM 9.0 9.1 8.7* 9.1 9.3    GFR: Estimated Creatinine Clearance: 81.6 mL/min (by C-G formula based on SCr of 0.51 mg/dL).  Liver Function Tests: Recent Labs  Lab 10/17/19 0420 10/17/19 2035 10/18/19 1211 10/19/19 0429 10/20/19 0500  AST 1,328* 1,354* 1,248* 1,181* 1,030*  ALT 962* 1,068* 945* 950* 868*  ALKPHOS 150* 167* 155* 153* 154*  BILITOT 9.4* 10.1* 11.5* 11.3* 13.6*  PROT 6.5 7.1 6.5 6.8 7.3  ALBUMIN 2.4* 2.5* 2.5* 2.6* 2.5*   No results for input(s): LIPASE, AMYLASE in the last 168 hours. Recent Labs  Lab 10/16/19 1940  AMMONIA 27    Coagulation Profile: Recent Labs  Lab 10/16/19 1940  INR 1.1    Cardiac Enzymes: No results for input(s): CKTOTAL, CKMB, CKMBINDEX, TROPONINI in the last 168 hours.  BNP (last 3 results) No results for input(s): PROBNP in the last 8760 hours.  Lipid Profile: No results for input(s): CHOL, HDL, LDLCALC, TRIG, CHOLHDL, LDLDIRECT in the last 72 hours.  Thyroid Function Tests: No  results for input(s): TSH, T4TOTAL, FREET4, T3FREE, THYROIDAB in the last 72 hours.  Anemia Panel: No results for input(s): VITAMINB12, FOLATE, FERRITIN, TIBC, IRON, RETICCTPCT in the last 72 hours.  Urine analysis:    Component Value Date/Time   COLORURINE AMBER (A) 10/16/2019 2211   APPEARANCEUR CLEAR 10/16/2019 2211   LABSPEC 1.010 10/16/2019 2211   PHURINE 5.0 10/16/2019 2211   GLUCOSEU NEGATIVE 10/16/2019 2211   HGBUR LARGE (A) 10/16/2019 2211   BILIRUBINUR NEGATIVE 10/16/2019 2211   KETONESUR NEGATIVE 10/16/2019 2211   PROTEINUR NEGATIVE 10/16/2019 2211   UROBILINOGEN 0.2 03/30/2019 1215   NITRITE NEGATIVE 10/16/2019 2211   LEUKOCYTESUR SMALL (A) 10/16/2019 2211    Sepsis Labs: Lactic Acid, Venous No results found for: LATICACIDVEN  MICROBIOLOGY: Recent Results (from the past 240 hour(s))  SARS Coronavirus 2 by RT PCR (hospital order, performed in [place] hospital lab) Nasopharyngeal Nasopharyngeal Swab     Status: None   Collection Time: 10/12/19  1:43 AM   Specimen: Nasopharyngeal Swab  Result Value Ref Range Status   SARS Coronavirus 2 NEGATIVE NEGATIVE Final    Comment: (  NOTE) SARS-CoV-2 target nucleic acids are NOT DETECTED.  The SARS-CoV-2 RNA is generally detectable in upper and lower respiratory specimens during the acute phase of infection. The lowest concentration of SARS-CoV-2 viral copies this assay can detect is 250 copies / mL. A negative result does not preclude SARS-CoV-2 infection and should not be used as the sole basis for treatment or other patient management decisions.  A negative result may occur with improper specimen collection / handling, submission of specimen other than nasopharyngeal swab, presence of viral mutation(s) within the areas targeted by this assay, and inadequate number of viral copies (<250 copies / mL). A negative result must be combined with clinical observations, patient history, and epidemiological  information.  Fact Sheet for Patients:   StrictlyIdeas.no  Fact Sheet for Healthcare Providers: BankingDealers.co.za  This test is not yet approved or  cleared by the [place] FDA and has been authorized for detection and/or diagnosis of SARS-CoV-2 by FDA under an Emergency Use Authorization (EUA).  This EUA will remain in effect (meaning this test can be used) for the duration of the COVID-19 declaration under Section 564(b)(1) of the Act, 21 U.S.C. section 360bbb-3(b)(1), unless the authorization is terminated or revoked sooner.  Performed at [facility], [place] [address], [place],  [address]     RADIOLOGY STUDIES/RESULTS: US Abdomen Complete  Result Date: 10/19/2019 CLINICAL DATA:  Abnormal LFTs EXAM: ABDOMEN ULTRASOUND COMPLETE COMPARISON:  None. FINDINGS: Gallbladder: Gallbladder appears contracted with mild wall thickening, 4 mm. No visible stones or sonographic Murphy sign. Common bile duct: Diameter: Normal caliber, 3 mm Liver: No focal lesion identified. Within normal limits in parenchymal echogenicity. Portal vein is patent on color Doppler imaging with normal direction of blood flow towards the liver. IVC: No abnormality visualized. Pancreas: Visualized portion unremarkable. Spleen: Size and appearance within normal limits. Right Kidney: Length: 12.5 cm. Echogenicity within normal limits. No mass or hydronephrosis visualized. Left Kidney: Length: 11.9 cm. Echogenicity within normal limits. No mass or hydronephrosis visualized. Abdominal aorta: No aneurysm visualized. Other findings: None. IMPRESSION: Contracted gallbladder with mild wall thickening. No visible gallstones. Electronically Signed   By: Rolm Baptise M.D.   On: 10/19/2019 15:53     LOS: 7 days   Oren Binet, MD  [facility]    To contact the attending provider between 7A-7P or the covering provider during after hours 7P-7A, please log  into the web site [email or web address] and access using universal [place] password for that web site. If you do not have the password, please call the hospital operator.  10/20/2019, 2:36 PM

## 2019-10-20 NOTE — Plan of Care (Signed)

## 2019-10-20 NOTE — Progress Notes (Signed)
During routine line check found midline dsg with edges slightly peeling due to patients itching (as per patient). Refused to allow dressing to be changed at this time stating she will be leaving tomorrow. Explained the importance of keeping dressing sealed and intact. Will have RN monitor for changes.

## 2019-10-21 LAB — HEPATIC FUNCTION PANEL
ALT: 878 U/L — ABNORMAL HIGH (ref 0–44)
AST: 1083 U/L — ABNORMAL HIGH (ref 15–41)
Albumin: 2.9 g/dL — ABNORMAL LOW (ref 3.5–5.0)
Alkaline Phosphatase: 167 U/L — ABNORMAL HIGH (ref 38–126)
Bilirubin, Direct: 11.1 mg/dL — ABNORMAL HIGH (ref 0.0–0.2)
Indirect Bilirubin: 5.2 mg/dL — ABNORMAL HIGH (ref 0.3–0.9)
Total Bilirubin: 16.3 mg/dL — ABNORMAL HIGH (ref 0.3–1.2)
Total Protein: 7.8 g/dL (ref 6.5–8.1)

## 2019-10-21 LAB — PROTIME-INR
INR: 1 (ref 0.8–1.2)
Prothrombin Time: 12.8 seconds (ref 11.4–15.2)

## 2019-10-21 MED ORDER — ENOXAPARIN SODIUM 40 MG/0.4ML ~~LOC~~ SOLN: 40.0000 mg | SUBCUTANEOUS | Status: DC

## 2019-10-21 NOTE — Consult Note (Addendum)
Chief Complaint: Patient was seen in consultation today for image guided random liver biopsy Chief Complaint  Patient presents with  . Diarrhea    Referring Physician(s): Brahmbhatt,P  Supervising Physician: Corrie Mckusick  Patient Status: [facility] - In-pt  History of Present Illness: Mary Frey is a 44 y.o. female with PMH anemia, fibroids, Graves dz, HTN admitted to [facility] 10/11/19 with lower abd pain, diarrhea and noted to have hepatitis/jaundice? secondary to methimazole toxicity. Now with worsening LFT's, t bili 16.3.  [place] abd with contracted GB,mild wall thickening, no stones, no ascites. No focal liver lesion. Acute hepatitis panel negative, HSV IgM positive, CMVand EBVIgM negative, no deficiencies of alpha-1-antitrypsin and ceruloplasmin ASMAandAMA negative.Request now received from GI service for image guided random liver biopsy.   Past Medical History:  Diagnosis Date  . Anemia   . Chronic back pain   . Fibroids   . Graves disease   . Hypertension   . Pregnancy induced hypertension     Past Surgical History:  Procedure Laterality Date  . NO PAST SURGERIES      Allergies: Hydrocodone, Methimazole, Ptu [propylthiouracil], and Latex  Medications: Prior to Admission medications   Medication Sig Start Date End Date Taking? Authorizing Provider  ferrous sulfate 325 (65 FE) MG EC tablet Take 325 mg by mouth daily with breakfast.   Yes [provider]  hydrochlorothiazide (HYDRODIURIL) 12.5 MG tablet Take 12.5 mg by mouth daily.   Yes [provider]  hydrOXYzine (ATARAX/VISTARIL) 25 MG tablet Take 25 mg by mouth every 6 (six) hours as needed for anxiety or itching.    Yes [provider]  melatonin 5 MG TABS Take 5 mg by mouth at bedtime.   Yes [provider]  traZODone (DESYREL) 50 MG tablet Take 50 mg by mouth at bedtime.   Yes [provider]  methimazole (TAPAZOLE) 10 MG tablet Take 10 mg in the morning and 5 mg in  the evening. Patient not taking: Reported on 06/21/2019 06/28/15   Antonietta Breach, PA-C  Norethindrone Acetate-Ethinyl Estrad-FE (LOESTRIN 24 FE) 1-20 MG-MCG(24) tablet Take 1 tablet by mouth daily. Patient not taking: Reported on 10/11/2019 07/21/19   Woodroe Mode, MD  Prenatal Vit-Fe Fumarate-FA (PRENATAL COMPLETE) 14-0.4 MG TABS Take 1 tablet by mouth daily. Patient not taking: Reported on 10/11/2019 07/21/19   Woodroe Mode, MD  norethindrone (MICRONOR,CAMILA,ERRIN) 0.35 MG tablet Take 1 tablet (0.35 mg total) by mouth daily. Patient not taking: Reported on 06/27/2015 03/30/14 03/30/19  Lahoma Crocker, MD  propranolol (INDERAL) 20 MG tablet Take 1 tablet (20 mg total) by mouth 2 (two) times daily. Patient not taking: Reported on 06/27/2015 08/12/13 03/30/19  Philemon Kingdom, MD     Family History  Problem Relation Age of Onset  . Diabetes Maternal Aunt   . Diabetes Maternal Uncle   . Diabetes Maternal Grandmother   . Diabetes Maternal Grandfather   . Stroke Paternal Grandfather   . Breast cancer Mother     Social History   Socioeconomic History  . Marital status: Single    Spouse name: Not on file  . Number of children: Not on file  . Years of education: Not on file  . Highest education level: Not on file  Occupational History  . Not on file  Tobacco Use  . Smoking status: Light Tobacco Smoker    Years: 14.00    Types: Cigarettes  . Smokeless tobacco: Never Used  Substance and Sexual Activity  . Alcohol use:  Yes    Alcohol/week: 0.0 standard drinks    Comment: on occasion  . Drug use: No    Types: Cocaine  . Sexual activity: Not Currently  Other Topics Concern  . Not on file  Social History Narrative   Regular exercise: walk   Caffeine use: 2 times a week   Social Determinants of Health   Financial Resource Strain:   . Difficulty of Paying Living Expenses:   Food Insecurity:   . Worried About Charity fundraiser in the Last Year:   . Arboriculturist in the Last  Year:   Transportation Needs:   . Film/video editor (Medical):   Marland Kitchen Lack of Transportation (Non-Medical):   Physical Activity:   . Days of Exercise per Week:   . Minutes of Exercise per Session:   Stress:   . Feeling of Stress :   Social Connections:   . Frequency of Communication with Friends and Family:   . Frequency of Social Gatherings with Friends and Family:   . Attends Religious Services:   . Active Member of Clubs or Organizations:   . Attends Archivist Meetings:   Marland Kitchen Marital Status:       Review of Systems see above; denies fever,CP,dyspnea, cough, worsening abd /back pain,N/V or bleeding; she does have fatigue and intermittent HA's  Vital Signs: BP (!) 152/93 (BP Location: Right Arm)   Pulse 60   Temp (!) 97.5 F (36.4 C) (Oral)   Resp 18   Ht 5\' 5"  (1.651 m)   Wt 114 lb 10.2 oz (52 kg)   SpO2 96%   BMI 19.08 kg/m   Physical Exam: awake/alert; chest - CTA bilat; heart- RRR; abd- soft,+BS,NT; no LE edema; scleral icterus noted  Imaging: US Abdomen Complete  Result Date: 10/19/2019 CLINICAL DATA:  Abnormal LFTs EXAM: ABDOMEN ULTRASOUND COMPLETE COMPARISON:  None. FINDINGS: Gallbladder: Gallbladder appears contracted with mild wall thickening, 4 mm. No visible stones or sonographic Murphy sign. Common bile duct: Diameter: Normal caliber, 3 mm Liver: No focal lesion identified. Within normal limits in parenchymal echogenicity. Portal vein is patent on color Doppler imaging with normal direction of blood flow towards the liver. IVC: No abnormality visualized. Pancreas: Visualized portion unremarkable. Spleen: Size and appearance within normal limits. Right Kidney: Length: 12.5 cm. Echogenicity within normal limits. No mass or hydronephrosis visualized. Left Kidney: Length: 11.9 cm. Echogenicity within normal limits. No mass or hydronephrosis visualized. Abdominal aorta: No aneurysm visualized. Other findings: None. IMPRESSION: Contracted gallbladder with mild  wall thickening. No visible gallstones. Electronically Signed   By: Rolm Baptise M.D.   On: 10/19/2019 15:53   [place] EKG SITE RITE  Result Date: 10/16/2019 If Site Rite image not attached, placement could not be confirmed due to current cardiac rhythm.  US Abdomen Limited RUQ  Result Date: 10/11/2019 CLINICAL DATA:  Elevated liver function EXAM: ULTRASOUND ABDOMEN LIMITED RIGHT UPPER QUADRANT COMPARISON:  None. FINDINGS: Gallbladder: No gallstones or wall thickening visualized. No sonographic Murphy sign noted by sonographer. Common bile duct: Diameter: 4.2 mm Liver: No focal lesion identified. Within normal limits in parenchymal echogenicity. Portal vein is patent on color Doppler imaging with normal direction of blood flow towards the liver. Other: None. IMPRESSION: Negative right upper quadrant abdominal ultrasound Electronically Signed   By: Donavan Foil M.D.   On: 10/11/2019 23:15    Labs:  CBC: Recent Labs    10/12/19 1651 10/14/19 0420 10/16/19 1940 10/20/19 0500  WBC 7.1 8.0  9.1 8.0  HGB 11.5* 11.7* 10.4* 10.7*  HCT 32.3* 33.0* 29.1* 29.2*  PLT 300 314 361 378    COAGS: Recent Labs    06/22/19 0402 10/12/19 1651 10/16/19 1940  INR 1.0 1.0 1.1  APTT 30  --   --     BMP: Recent Labs    10/17/19 2035 10/18/19 1211 10/19/19 0429 10/20/19 0500  NA 135 136 136 137  K 4.7 3.6 3.9 3.8  CL 105 107 105 108  CO2 23 20* 21* 22  GLUCOSE 109* 120* 87 88  BUN 9 5* 8 8  CALCIUM 9.1 8.7* 9.1 9.3  CREATININE 0.51 0.56 0.73 0.51  GFRNONAA >60 >60 >60 >60  GFRAA >60 >60 >60 >60    LIVER FUNCTION TESTS: Recent Labs    10/18/19 1211 10/19/19 0429 10/20/19 0500 10/21/19 0916  BILITOT 11.5* 11.3* 13.6* 16.3*  AST 1,248* 1,181* 1,030* 1,083*  ALT 945* 950* 868* 878*  ALKPHOS 155* 153* 154* 167*  PROT 6.5 6.8 7.3 7.8  ALBUMIN 2.5* 2.6* 2.5* 2.9*    TUMOR MARKERS: No results for input(s): AFPTM, CEA, CA199, CHROMGRNA in the last 8760 hours.  Assessment and  Plan: 44 y.o. female with PMH anemia, fibroids, Graves dz, HTN admitted to [facility] 10/11/19 with lower abd pain, diarrhea and noted to have hepatitis/jaundice? secondary to methimazole toxicity. Now with worsening LFT's, t bili 16.3.  [place] abd with contracted GB,mild wall thickening, no stones, no ascites. No focal liver lesion. Acute hepatitis panel negative, HSV IgM positive, CMVand EBVIgM negative, no deficiencies of alpha-1-antitrypsin and ceruloplasmin ASMAandAMA negative.Request now received from GI service for image guided random liver biopsy. Risks and benefits of procedure was discussed with the patient  including, but not limited to bleeding, infection, damage to adjacent structures or low yield requiring additional tests.  All of the questions were answered; pt prefers to wait until 7/2 to proceed with bx; she does not wish to sign permit until 7/2; nurse made aware        Thank you for this interesting consult.  I greatly enjoyed meeting Mary Frey and look forward to participating in their care.  A copy of this report was sent to the requesting provider on this date.  Electronically Signed: D. Rowe Robert, PA-C 10/21/2019, 10:36 AM   I spent a total of 25 minutes  in face to face in clinical consultation, greater than 50% of which was counseling/coordinating care for image guided random liver biopsy

## 2019-10-21 NOTE — Progress Notes (Signed)
Patient refused lab draw.Unable to assess line. [place] to have line removed even after explaining that MD orders needed for discontinuation. RN notified.

## 2019-10-21 NOTE — Progress Notes (Signed)
Pt refused IV team to draw labs from PICC line  Pt stating "I don't want no blood taken, this thing is supposed to come out of my arm today and I want it out."  Education provided to pt on reasoning doctors want/need labs collected.   Pt then stating "I have not been treated for anything since I have been here. Everyone is just telling me I need to wait to get better. Most I have been given is IV fluids".   More education provided to pt about treatment plan from MDs and her plan of care.

## 2019-10-21 NOTE — Progress Notes (Signed)
PROGRESS NOTE        PATIENT DETAILS Name: Mary Frey Age: 44 y.o. Sex: female Date of Birth: April 03, 1976 Admit Date: 10/11/2019 Admitting Physician Kayleen Memos, DO HRC:BULAGTXM, [name], MD  Brief Narrative: Patient is a 44 y.o. female with history of Graves' disease-stopped taking methimazole December 2020-subsequently started having symptomatic hypothyroidism-and was restarted on methimazole-presented to the ED with lower abdominal pain, diarrhea-found to have hepatitis with jaundice-thought to be from methimazole toxicity and admitted to the hospitalist service..  Significant events: 6/21>> admit to [facility] for hepatitis/jaundice  Significant studies: 6/21>> RUQ ultrasound: Negative right upper quadrant abdominal ultrasound 6/29>> abdominal ultrasound complete: Contracted gallbladder with mild wall thickening.  Antimicrobial therapy: None  Microbiology data: None  Procedures : None  Consults: GI  DVT Prophylaxis : enoxaparin (LOVENOX) injection 40 mg Start: 10/23/19 1000  Subjective: Continues to have mild pruritus-sclera is icteric.  Has multiple questions about HSV IgM positive titer-very hesitant to do a HSV PCR-reluctantly agreed (had refused blood work earlier per nursing documentation).  Does not want to be tested for HIV-Per prior documentation she has refused this in the past as well.  (Rounded with RN at bedside)  Assessment/Plan: Acute hepatitis with jaundice: Thought to be from methimazole toxicity-however LFTs continue to be persistently elevated-acute hepatitis serology negative, AMA, ASMA negative.  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  I spoke about possibly ruling out HIV infection as well-she does not want to be tested for HIV-I have reconsulted GI today-recommendations are to proceed with a liver biopsy.   Positive ANA and SSA antibody,  IgG: SSA titer mildly elevated-however dsDNA negative.  Will require outpatient follow-up with rheumatology.    Hyperthyroidism: Due to significantly elevated LFTs-methimazole on hold.  Remains on atenolol.  Patient had a appointment scheduled with her outpatient endocrinologist on 10/21/2019 at 2:40 PM-this will be need to reschedule due to plans for liver biopsy.  HTN: BP controlled-continue atenolol.  History of iron deficiency anemia: Continue iron supplementation  Diet: Diet Order            Diet NPO time specified Except for: Sips with Meds  Diet effective midnight           Diet regular Room service appropriate? Yes; Fluid consistency: Thin  Diet effective now                 Code Status: Full code  Family Communication: None at bedside  Disposition Plan: Status is: Inpatient  Remains inpatient appropriate because:Inpatient level of care appropriate due to severity of illness  Dispo:  Patient From: Home  Planned Disposition: Home  Expected discharge date: 10/20/19  Medically stable for discharge: No   Barriers to Discharge: Significantly elevated LFTs  Antimicrobial agents: Anti-infectives (From admission, onward)   None     Time spent: 25- minutes-Greater than 50% of this time was spent in counseling, explanation of diagnosis, planning of further management, and coordination of care.  MEDICATIONS: Scheduled Meds: . atenolol  12.5 mg Oral BID  . [START ON 10/23/2019] enoxaparin (LOVENOX) injection  40 mg Subcutaneous Q24H  . ferrous sulfate  325 mg Oral Q breakfast  . gabapentin  100 mg Oral TID  . melatonin  5 mg Oral QHS  . sodium chloride flush  10-40 mL  Intracatheter Q12H   Continuous Infusions: . sodium chloride 10 mL/hr at 10/20/19 1654   PRN Meds:.camphor-menthol, diphenhydrAMINE, hydrOXYzine, ibuprofen, loperamide, ondansetron **OR** ondansetron (ZOFRAN) IV, polyvinyl alcohol, sodium chloride flush   PHYSICAL EXAM: Vital signs: Vitals:    10/20/19 1454 10/20/19 2122 10/21/19 0452 10/21/19 0745  BP: (!) 152/91 (!) 155/83 (!) 145/61 (!) 152/93  Pulse: (!) 57 72 (!) 51 60  Resp: 18 18 16 18   Temp: 97.7 F (36.5 C) 98.2 F (36.8 C) (!) 97.5 F (36.4 C) (!) 97.5 F (36.4 C)  TempSrc: Oral Oral Oral Oral  SpO2: 100% 100% 100% 96%  Weight:   52 kg   Height:       Filed Weights   10/19/19 0531 10/20/19 0355 10/21/19 0452  Weight: 54.8 kg 56.4 kg 52 kg   Body mass index is 19.08 kg/m.   Gen Exam:Alert awake-not in any distress HEENT:atraumatic, normocephalic Chest: B/L clear to auscultation anteriorly CVS:S1S2 regular Abdomen:soft non tender, non distended Extremities:no edema Neurology: Non focal Skin: no rash  I have personally reviewed following labs and imaging studies  LABORATORY DATA: CBC: Recent Labs  Lab 10/16/19 1940 10/20/19 0500  WBC 9.1 8.0  NEUTROABS 6.0 5.0  HGB 10.4* 10.7*  HCT 29.1* 29.2*  MCV 70.0* 69.2*  PLT 361 235    Basic Metabolic Panel: Recent Labs  Lab 10/17/19 0420 10/17/19 2035 10/18/19 1211 10/19/19 0429 10/20/19 0500  NA 136 135 136 136 137  K 4.4 4.7 3.6 3.9 3.8  CL 108 105 107 105 108  CO2 20* 23 20* 21* 22  GLUCOSE 86 109* 120* 87 88  BUN 11 9 5* 8 8  CREATININE 0.59 0.51 0.56 0.73 0.51  CALCIUM 9.0 9.1 8.7* 9.1 9.3    GFR: Estimated Creatinine Clearance: 75.2 mL/min (by C-G formula based on SCr of 0.51 mg/dL).  Liver Function Tests: Recent Labs  Lab 10/17/19 2035 10/18/19 1211 10/19/19 0429 10/20/19 0500 10/21/19 0916  AST 1,354* 1,248* 1,181* 1,030* 1,083*  ALT 1,068* 945* 950* 868* 878*  ALKPHOS 167* 155* 153* 154* 167*  BILITOT 10.1* 11.5* 11.3* 13.6* 16.3*  PROT 7.1 6.5 6.8 7.3 7.8  ALBUMIN 2.5* 2.5* 2.6* 2.5* 2.9*   No results for input(s): LIPASE, AMYLASE in the last 168 hours. Recent Labs  Lab 10/16/19 1940  AMMONIA 27    Coagulation Profile: Recent Labs  Lab 10/16/19 1940  INR 1.1    Cardiac Enzymes: No results for input(s):  CKTOTAL, CKMB, CKMBINDEX, TROPONINI in the last 168 hours.  BNP (last 3 results) No results for input(s): PROBNP in the last 8760 hours.  Lipid Profile: No results for input(s): CHOL, HDL, LDLCALC, TRIG, CHOLHDL, LDLDIRECT in the last 72 hours.  Thyroid Function Tests: No results for input(s): TSH, T4TOTAL, FREET4, T3FREE, THYROIDAB in the last 72 hours.  Anemia Panel: No results for input(s): VITAMINB12, FOLATE, FERRITIN, TIBC, IRON, RETICCTPCT in the last 72 hours.  Urine analysis:    Component Value Date/Time   COLORURINE AMBER (A) 10/16/2019 2211   APPEARANCEUR CLEAR 10/16/2019 2211   LABSPEC 1.010 10/16/2019 2211   PHURINE 5.0 10/16/2019 2211   GLUCOSEU NEGATIVE 10/16/2019 2211   HGBUR LARGE (A) 10/16/2019 2211   BILIRUBINUR NEGATIVE 10/16/2019 2211   KETONESUR NEGATIVE 10/16/2019 2211   PROTEINUR NEGATIVE 10/16/2019 2211   UROBILINOGEN 0.2 03/30/2019 1215   NITRITE NEGATIVE 10/16/2019 2211   LEUKOCYTESUR SMALL (A) 10/16/2019 2211    Sepsis Labs: Lactic Acid, Venous No results found for: LATICACIDVEN  MICROBIOLOGY: Recent Results (from the past 240 hour(s))  SARS Coronavirus 2 by RT PCR (hospital order, performed in [facility] hospital lab) Nasopharyngeal Nasopharyngeal Swab     Status: None   Collection Time: 10/12/19  1:43 AM   Specimen: Nasopharyngeal Swab  Result Value Ref Range Status   SARS Coronavirus 2 NEGATIVE NEGATIVE Final    Comment: (NOTE) SARS-CoV-2 target nucleic acids are NOT DETECTED.  The SARS-CoV-2 RNA is generally detectable in upper and lower respiratory specimens during the acute phase of infection. The lowest concentration of SARS-CoV-2 viral copies this assay can detect is 250 copies / mL. A negative result does not preclude SARS-CoV-2 infection and should not be used as the sole basis for treatment or other patient management decisions.  A negative result may occur with improper specimen collection / handling, submission of specimen  other than nasopharyngeal swab, presence of viral mutation(s) within the areas targeted by this assay, and inadequate number of viral copies (<250 copies / mL). A negative result must be combined with clinical observations, patient history, and epidemiological information.  Fact Sheet for Patients:   StrictlyIdeas.no  Fact Sheet for Healthcare Providers: BankingDealers.co.za  This test is not yet approved or  cleared by the [place] FDA and has been authorized for detection and/or diagnosis of SARS-CoV-2 by FDA under an Emergency Use Authorization (EUA).  This EUA will remain in effect (meaning this test can be used) for the duration of the COVID-19 declaration under Section 564(b)(1) of the Act, 21 U.S.C. section 360bbb-3(b)(1), unless the authorization is terminated or revoked sooner.  Performed at [facility], [place] [address] [place], [place] [address]     RADIOLOGY STUDIES/RESULTS: US Abdomen Complete  Result Date: 10/19/2019 CLINICAL DATA:  Abnormal LFTs EXAM: ABDOMEN ULTRASOUND COMPLETE COMPARISON:  None. FINDINGS: Gallbladder: Gallbladder appears contracted with mild wall thickening, 4 mm. No visible stones or sonographic Murphy sign. Common bile duct: Diameter: Normal caliber, 3 mm Liver: No focal lesion identified. Within normal limits in parenchymal echogenicity. Portal vein is patent on color Doppler imaging with normal direction of blood flow towards the liver. IVC: No abnormality visualized. Pancreas: Visualized portion unremarkable. Spleen: Size and appearance within normal limits. Right Kidney: Length: 12.5 cm. Echogenicity within normal limits. No mass or hydronephrosis visualized. Left Kidney: Length: 11.9 cm. Echogenicity within normal limits. No mass or hydronephrosis visualized. Abdominal aorta: No aneurysm visualized. Other findings: None. IMPRESSION: Contracted gallbladder with mild wall thickening. No visible  gallstones. Electronically Signed   By: Rolm Baptise M.D.   On: 10/19/2019 15:53     LOS: 8 days   Oren Binet, MD  [facility]    To contact the attending provider between 7A-7P or the covering provider during after hours 7P-7A, please log into the web site [email or web address] and access using universal [place] password for that web site. If you do not have the password, please call the hospital operator.  10/21/2019, 2:20 PM

## 2019-10-21 NOTE — Progress Notes (Signed)
@  0400,entered pt room for dbiv. Patient stated line needs to be removed. Explained that no MD orders received for discontinuation. I explain DBIV but pt stated she will only have  one tube drawn. Spoke to nurse and stated he will talk to her. Stated will come back later.

## 2019-10-21 NOTE — Progress Notes (Signed)
Pt refused to have any labs drawn until she speaks with her doctor

## 2019-10-21 NOTE — Progress Notes (Signed)
[facility] Progress Note  Mary Frey 44 y.o. 03/26/1976  CC:  transaminitis  Subjective: Patient denies any abdominal pain, nausea, or vomiting.  States she has had a normal appetite and is tolerating a regular diet.  Diarrhea that she had earlier in the hospitalization has now resolved.  Endorses fatigue and "brain fog" but continues to be asymptomatic from a GI standpoint.  ROS : Review of Systems  Constitutional: Positive for malaise/fatigue.  Cardiovascular: Negative for chest pain and palpitations.  Gastrointestinal: Negative for abdominal pain, blood in stool, constipation, diarrhea, heartburn, melena, nausea and vomiting.   Objective: Vital signs in last 24 hours: Vitals:   10/21/19 0452 10/21/19 0745  BP: (!) 145/61 (!) 152/93  Pulse: (!) 51 60  Resp: 16 18  Temp: (!) 97.5 F (36.4 C) (!) 97.5 F (36.4 C)  SpO2: 100% 96%    Physical Exam:  General:  Alert, oriented, cooperative, sitting in bed eating breakfast in no acute distress  Head:  Normocephalic, without obvious abnormality, atraumatic  Eyes:  Deep icterus, EOMs intact  Lungs:   Clear to auscultation bilaterally, respirations unlabored  Heart:  Regular rate and rhythm, S1, S2 normal  Abdomen:   Soft, non-tender, non-distended, normoactive bowel sounds, no guarding or peritoneal signs  Extremities: Extremities normal, atraumatic, no  edema  Pulses: 2+ and symmetric    Lab Results: Recent Labs    10/19/19 0429 10/20/19 0500  NA 136 137  K 3.9 3.8  CL 105 108  CO2 21* 22  GLUCOSE 87 88  BUN 8 8  CREATININE 0.73 0.51  CALCIUM 9.1 9.3   Recent Labs    10/19/19 0429 10/20/19 0500  AST 1,181* 1,030*  ALT 950* 868*  ALKPHOS 153* 154*  BILITOT 11.3* 13.6*  PROT 6.8 7.3  ALBUMIN 2.6* 2.5*   Recent Labs    10/20/19 0500  WBC 8.0  NEUTROABS 5.0  HGB 10.7*  HCT 29.2*  MCV 69.2*  PLT 378   No results for input(s): LABPROT, INR in the last 72 hours.  Transaminitis:unclear  etiology. Initially, there was concern this could be related to recent methimazole use; however, AST/ALT/ALP remain elevated, and T. Bili continues to rise. -Yesterday, T. Bili 13.6/ AST 1030/ ALT 868/ ALP 154.  Today's labs pending -Acute hepatitis panel negative -HSV IgM positive -CMV and EBV IgM negative -No deficiencies of alpha-1-antitrypsin and ceruloplasmin -ASMA and AMA negative -Right upper quadrant ultrasound was unremarkable  Weight loss, likely related to Graves' disease.  Plan: Placed IR consultation for liver biopsy.  Liver biopsy discussed with patient and she agrees to proceed.  HSV, HCV PCRs ordered.  Continue supportive care. Continue to monitor LFTs.  [facility] will follow.  Salley Slaughter PA-C 10/21/2019, 9:31 AM  Contact #  [phone]

## 2019-10-22 DIAGNOSIS — E0500 Thyrotoxicosis with diffuse goiter without thyrotoxic crisis or storm: Secondary | ICD-10-CM

## 2019-10-22 LAB — CBC WITH DIFFERENTIAL/PLATELET
Abs Immature Granulocytes: 0.04 10*3/uL (ref 0.00–0.07)
Basophils Absolute: 0 10*3/uL (ref 0.0–0.1)
Basophils Relative: 0 %
Eosinophils Absolute: 0.3 10*3/uL (ref 0.0–0.5)
Eosinophils Relative: 3 %
HCT: 32 % — ABNORMAL LOW (ref 36.0–46.0)
Hemoglobin: 11.6 g/dL — ABNORMAL LOW (ref 12.0–15.0)
Immature Granulocytes: 0 %
Lymphocytes Relative: 17 %
Lymphs Abs: 1.7 10*3/uL (ref 0.7–4.0)
MCH: 25.2 pg — ABNORMAL LOW (ref 26.0–34.0)
MCHC: 36.3 g/dL — ABNORMAL HIGH (ref 30.0–36.0)
MCV: 69.4 fL — ABNORMAL LOW (ref 80.0–100.0)
Monocytes Absolute: 0.9 10*3/uL (ref 0.1–1.0)
Monocytes Relative: 9 %
Neutro Abs: 6.8 10*3/uL (ref 1.7–7.7)
Neutrophils Relative %: 71 %
Platelets: 421 10*3/uL — ABNORMAL HIGH (ref 150–400)
RBC: 4.61 MIL/uL (ref 3.87–5.11)
RDW: 18.6 % — ABNORMAL HIGH (ref 11.5–15.5)
WBC: 9.7 10*3/uL (ref 4.0–10.5)
nRBC: 0 % (ref 0.0–0.2)

## 2019-10-22 LAB — HEPATIC FUNCTION PANEL
ALT: 779 U/L — ABNORMAL HIGH (ref 0–44)
AST: 956 U/L — ABNORMAL HIGH (ref 15–41)
Albumin: 2.8 g/dL — ABNORMAL LOW (ref 3.5–5.0)
Alkaline Phosphatase: 159 U/L — ABNORMAL HIGH (ref 38–126)
Bilirubin, Direct: 11.7 mg/dL — ABNORMAL HIGH (ref 0.0–0.2)
Indirect Bilirubin: 7 mg/dL — ABNORMAL HIGH (ref 0.3–0.9)
Total Bilirubin: 18.7 mg/dL (ref 0.3–1.2)
Total Protein: 7.6 g/dL (ref 6.5–8.1)

## 2019-10-22 LAB — HEPATITIS C VRS RNA DETECT BY PCR-QUAL: Hepatitis C Vrs RNA by PCR-Qual: NEGATIVE

## 2019-10-22 LAB — PROTIME-INR
INR: 1.1 (ref 0.8–1.2)
Prothrombin Time: 13.5 seconds (ref 11.4–15.2)

## 2019-10-22 MED ORDER — HYDROXYZINE HCL 10 MG/5ML PO SYRP: 10.0000 mg | ORAL_SOLUTION | Freq: Three times a day (TID) | ORAL | 0 refills | Status: DC | PRN

## 2019-10-22 MED ORDER — ATENOLOL 25 MG PO TABS: 12.5000 mg | ORAL_TABLET | Freq: Two times a day (BID) | ORAL | 0 refills | Status: DC

## 2019-10-22 NOTE — Plan of Care (Signed)
  Problem: Health Behavior/Discharge Planning: Goal: Ability to manage health-related needs will improve Outcome: Adequate for Discharge   

## 2019-10-22 NOTE — Progress Notes (Signed)
-  Patient seen and examined at bedside.  Chart reviewed.  Discussed with hospitalist.  Abdominal exam remains benign.  Deep icterus noted in the sclera.  -She was seen by interventional radiology team yesterday but she refused liver biopsy.  -Long discussion with patient.  She is refusing blood work.  Refusing HIV testing.  She thinks she would have gotten better care at [facility] because " they know my history ".  She got somewhat upset when I told her that [facility] system.   -  She also thinks her jaundice is getting better despite of elevated T bili at 16.3 as of yesterday.  -She is also questioning that why all the blood work and liver biopsy was not done on day of admission.  I tried to explain that we use stepwise approach to rule out common things first .  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  Pending HSV PCR.  Also HCV PCR pending.  -Patient is refusing blood work.  She insisted on getting discharged today.  Approximately 30 minutes spent in the patient encounter.  Long discussion with the patient at bedside.  Discussed with Dr. Sloan Leiter.  Otis Brace MD, [place] 10/22/2019, 9:45 AM  Contact #  [phone]

## 2019-10-22 NOTE — TOC Initial Note (Signed)
Transition of Care [facility]) - Initial/Assessment Note    Patient Details  Name: Mary Frey MRN: [id number] Date of Birth: 18-May-1975  Transition of [facility] CM/SW Contact:    Curlene Labrum, RN Phone Number: 10/22/2019, 3:00 PM  Clinical Narrative:                 Case Management met with the patient concerning discharge needs for home.  Patient was irritable and frustrated at the time that she remains in the hospital with no answers to her health issues.  The patient discussed need to question testing, biopsy and other lab tests.  She states that she is considering leaving the hospital today or tomorrow.  I gave patient support and education regarding hospital stay and need to be compliant with testing in order to find a proper diagnosis and treatment plan.  F/U appointment is listed regarding labwork at the clinic as ordered.  Will continue to follow for patient needs.  Expected Discharge Plan: Home/Self Care Barriers to Discharge: Continued Medical Work up   Patient Goals and CMS Choice Patient states their goals for this hospitalization and ongoing recovery are:: Patient states that her goal is to get better but she is frustrated at time and procedures for diagnosis. CMS Medicare.gov Compare Post Acute Care list provided to:: Patient    Expected Discharge Plan and Services Expected Discharge Plan: Home/Self Care   Discharge Planning Services: CM Consult   Living arrangements for the past 2 months: Single Family Home                                      Prior Living Arrangements/Services Living arrangements for the past 2 months: Single Family Home Lives with:: Self Patient language and need for interpreter reviewed:: Yes Do you feel safe going back to the place where you live?: Yes      Need for Family Participation in Patient Care: No (Comment) Care giver support system in place?: Yes (comment)   Criminal Activity/Legal Involvement Pertinent to Current  Situation/Hospitalization: No - Comment as needed  Activities of Daily Living Home Assistive Devices/Equipment: None ADL Screening (condition at time of admission) Patient's cognitive ability adequate to safely complete daily activities?: Yes Is the patient deaf or have difficulty hearing?: No Does the patient have difficulty seeing, even when wearing glasses/contacts?: No Does the patient have difficulty concentrating, remembering, or making decisions?: No Patient able to express need for assistance with ADLs?: Yes Does the patient have difficulty dressing or bathing?: No Independently performs ADLs?: Yes (appropriate for developmental age) Does the patient have difficulty walking or climbing stairs?: No Weakness of Legs: None Weakness of Arms/Hands: None  Permission Sought/Granted Permission sought to share information with : Case Manager Permission granted to share information with : Yes, Verbal Permission Granted              Emotional Assessment Appearance:: Appears stated age Attitude/Demeanor/Rapport: Reactive Affect (typically observed): Apprehensive Orientation: : Oriented to Self, Oriented to Place, Oriented to  Time, Oriented to Situation Alcohol / Substance Use: Not Applicable Psych Involvement: No (comment)  Admission diagnosis:  Elevated liver enzymes [R74.8] Abnormal liver enzymes [R74.8] Elevated bilirubin [R17] Abnormal LFTs [R94.5] Patient Active Problem List   Diagnosis Date Noted  . Abnormal LFTs 10/12/2019  . History of use of hepatotoxic drug 10/12/2019  . Abnormal liver enzymes 10/12/2019  . Other pancytopenia ([place]) 06/22/2019  .  Cobalamin deficiency 06/22/2019  . Cocaine abuse ([place]) 01/12/2013  . Protein-calorie malnutrition, severe ([place]) 01/12/2013  . Graves disease 01/12/2013  . Acute systolic CHF (congestive heart failure) ([place]) 01/12/2013  . HTN (hypertension) 01/12/2013  . Microcytic anemia 01/12/2013  . Tobacco use disorder 01/12/2013    PCP:  Mora Bellman, MD Pharmacy:   [facility] [phone] [address], [place] [address] [place] [address] Phone: [phone] Fax: [phone]     Social Determinants of Health (SDOH) Interventions    Readmission Risk Interventions Readmission Risk Prevention Plan 10/22/2019  Post Dischage Appt Complete  Medication Screening Complete  Transportation Screening Complete  Some recent data might be hidden

## 2019-10-22 NOTE — Progress Notes (Signed)
Informed by RN that patient is requesting discharge ASAP.  Please see prior notes from myself, radiology and GI.  Patient refused liver biopsy today.  Per patient-she wants to be discharged because we are "doing nothing for her in the hospital"  She wants to pursue outpatient liver biopsy-we will send request to radiology.  Case management in the process of arranging outpatient follow-up.  Explained risk of refusing medical treatment-she is aware of life-threatening and life disabling effects-of worsening liver failure.  We will discharge patient at own request-as she is completely awake/alert and oriented x4.

## 2019-10-22 NOTE — Progress Notes (Signed)
Refused blood work this am-does not want to proceed with a liver biopsy-inspite of GI MD (spoke with Dr Luan Pulling) and this MD explaining rationale-risk/benefits multiple times this morning.   Wants to leave the hospital-I tried to re-explain this am (RN at bedside while I was rounding)-she knows that there is no specific treatment unless we find out the underlying etiology-she seems fixated on the pending HSV PCR test-claims that she "knows its going to come back positive". After much discussion-she now wants to see what her LFT's are like today (refused labs this am) before deciding on a liver biopsy-she claims that one of the "other doctors" said that she could do the liver bx next week.    If she continues to refuse labs/bx-will discharge patient at her own request.

## 2019-10-22 NOTE — Progress Notes (Signed)
Mary Frey is a 44 year old female with history of anemia, fibroids, Graves disease, HTN admitted 10/11/19 with hepatitis and jaundice, worsening LFTs, Tbili 16.3.  She does have a positive HSV IgM, PCR pending and a history of methimazole toxicity. Her bilirubin continues to rise.  A liver biopsy has been requested.   Extensive discussion held with patient this AM.  Consult previously completed 7/1, however patient had refused to sign consent. She gives several reasons to refuse procedure today: states she wants to discuss with her relative, is waiting for all tests to come back, wants to know her lab results from today, is anxious, feels this should have been done "on day 3" of her admission, wants to do this next week, etc..   Despite talking through several of these hesitancies, offering to personally speak to her relative to answer any questions they may have, discussing her anxiety, she declines and is not consenting to the procedure today.   Discussed with Dr. Sloan Leiter.   Biopsy can be done as an outpatient if patient to discharge home.   Brynda Greathouse, MS RD PA-C 11:58 AM

## 2019-10-22 NOTE — Discharge Summary (Addendum)
PATIENT DETAILS Name: Mary Frey Age: 44 y.o. Sex: female Date of Birth: Nov 16, 1975 MRN: [id number]. Admitting Physician: Mary Memos, DO TGY:BWLSLHTD, Mary Chafe, MD  Admit Date: 10/11/2019 Discharge date: 10/22/2019  Note-patient being discharged home at her own request (see discussion below)   Recommendations for Outpatient Follow-up:  1. Follow up with PCP in 1-2 weeks 2. Please obtain CMP/CBC in one week-and then frequently (perhaps weekly) until LFTs start improving. 3. Will require outpatient follow-up with her gastroenterologist of her choice. 4. Will require follow-up with endocrinology for management of her hypothyroidism. 5. Outpatient follow-up with radiology for possible liver biopsy 6. HCV viral load and HSV PCR pending-please follow  Admitted From:  Home  Disposition: Home (at her own request-insisting/adamant on being discharged today)    Home Health: No  Equipment/Devices: None  Discharge Condition: Stable  CODE STATUS: FULL CODE  Diet recommendation:  Diet Order            Diet NPO time specified Except for: Sips with Meds  Diet effective midnight           Diet - low sodium heart healthy                  Brief Summary: Patient is a 44 y.o. female with history of Graves' disease-stopped taking methimazole December 2020-subsequently started having symptomatic hypothyroidism-and was restarted on methimazole-presented to the ED with lower abdominal pain, diarrhea-found to have hepatitis with jaundice-thought to be from methimazole toxicity and admitted to the hospitalist service.  Even with supportive care-close follow-up-LFTs continue to be persistently elevated-GI was subsequently consulted-liver biopsy was recommended-however patient refused and on 7/2 insisted on being discharged home.  See below for further details.  Significant events: 6/21>> admit to [facility] for hepatitis/jaundice  Significant studies: 6/21>> RUQ ultrasound: Negative  right upper quadrant abdominal ultrasound 6/29>> abdominal ultrasound complete: Contracted gallbladder with mild wall thickening. 6/30>> GI reconsulted for persistently elevated LFTs 7/2>> refused liver biopsy-requesting discharge home today!  Antimicrobial therapy: None  Microbiology data: None  Procedures : None  Consults: GI IR  Brief Hospital Course: Acute hepatitis with jaundice: Initially thought to be secondary to methimazole toxicity-however with supportive care-continued to have persistently elevated LFTs with no major improvement..Acute hepatitis serology negative.  AMA, ASMA negative.  IgM EBV and IgM CMV negative, however IgM HSV 1 +ve.  Spoke with ID MD-Dr. Glennie Frey HSV hepatitis is a clinical diagnosis-and that if clinical scenario is consistent with methimazole-then really no further work-up required.    Subsequently discussed with GI team-they re-evaluated the patient (had signed off a few days back)-and recommended a liver biopsy.  They also recommended a HSV PCR, and HCV PCR (currently pending at the time of discharge).  Patient was subsequently seen by IR on 7/1-she refused to sign consent.  This morning-she outright refused to perform a liver biopsy.  GI MD, IR-and this MD have explained the rationale, risks, benefits of performing a liver biopsy-we explained that at this point we are not sure if her liver issues are from methimazole-hence liver biopsy results may change outcome/treatment/management.  Patient at this time does not want to undergo liver biopsy-she wants to wait till next week-and wants to wait till her HSV PCR results come back as she is convinced that this could be the cause of her hepatitis.  Subsequently she is now asking to be discharged at any cost-see prior notes from GI, radiology and case management.  Very difficult situation-have spent significant time-over the past  few days that I have taken care of this patient-explaining complexities of  medical decision making-however even after this MD-and GI/IR team explaining clinical situation-she has refused to undergo liver biopsy.  Over the past few days-she has refused morning labs, CT scan of the abdomen (ordered by Dr. Nevada Crane) and HIV testing.  She refused HIV testing as well-even in spite of explaining that in certain cases-HIV could affect the liver.  At this point-patient continues to have persistently elevated LFTs and frank jaundice.  Apart from pleuritis/icterus-she really does not have any symptoms.  INR is stable.  Case management has seen patient-and will try and get the patient [facility]  At the patient's request-I have sent a epic message to radiology to see if we can have liver biopsy done in the outpatient setting.  She wants to leave the hospital today-she is aware of the life-threatening and life disabling risk of untreated/undiagnosed hepatitis-and claims that she will follow LFTs in the outpatient setting-claims that she will go to [facility].  Patient is awake/alert and oriented x4-and is insisting on being discharged-hence she will be discharged home at her own request.  Positive ANA and SSA antibody, IgG: SSA titer mildly elevated-however dsDNA negative.  Will require outpatient follow-up with rheumatology.    Hyperthyroidism: Due to significantly elevated LFTs-methimazole on hold.  Remains on atenolol.  Patient had a scheduled appointment with her outpatient endocrinologist on 10/21/2019 at 2:40 PM-however due to the fact that she remained in this hospital-this will need to be rescheduled-I have asked patient to call her primary endocrinologist office early next week and schedule a follow-up appointment.  HTN: BP controlled-continue atenolol.  History of iron deficiency anemia: Continue iron supplementation  Discharge Diagnoses:  Principal Problem:   Abnormal LFTs Active Problems:   Graves disease   HTN  (hypertension)   History of use of hepatotoxic drug   Abnormal liver enzymes   Discharge Instructions:  Activity:  As tolerated    Discharge Instructions    Call MD for:  difficulty breathing, headache or visual disturbances   Complete by: As directed    Call MD for:  extreme fatigue   Complete by: As directed    Call MD for:  persistant dizziness or light-headedness   Complete by: As directed    Call MD for:  persistant nausea and vomiting   Complete by: As directed    Call MD for:  severe uncontrolled pain   Complete by: As directed    Diet - low sodium heart healthy   Complete by: As directed    Discharge instructions   Complete by: As directed    Do not take any over-the-counter/herbal/prescription medications without talking with your primary care practitioner.  Do not take Tylenol-if you have headache or fever-take ibuprofen/Motrin/Advil/Aleve.  Please follow-up with your primary care practitioner for frequent LFTs  At your request-referral to radiology sent for possible liver biopsy  Follow with Primary MD  Constant, [name], MD in 1-2 weeks  Please get a complete blood count and chemistry panel checked by your Primary MD at your next visit, and again as instructed by your Primary MD.  Get Medicines reviewed and adjusted: Please take all your medications with you for your next visit with your Primary MD  Laboratory/radiological data: Please request your Primary MD to go over all hospital tests and procedure/radiological results at the follow up, please ask your Primary MD to get all Hospital records sent  to his/her office.  In some cases, they will be blood work, cultures and biopsy results pending at the time of your discharge. Please request that your primary care M.D. follows up on these results.  Also Note the following: If you experience worsening of your admission symptoms, develop shortness of breath, life threatening emergency, suicidal or homicidal  thoughts you must seek medical attention immediately by calling 911 or calling your MD immediately  if symptoms less severe.  You must read complete instructions/literature along with all the possible adverse reactions/side effects for all the Medicines you take and that have been prescribed to you. Take any new Medicines after you have completely understood and accpet all the possible adverse reactions/side effects.   Do not drive when taking Pain medications or sleeping medications (Benzodaizepines)  Do not take more than prescribed Pain, Sleep and Anxiety Medications. It is not advisable to combine anxiety,sleep and pain medications without talking with your primary care practitioner  Special Instructions: If you have smoked or chewed Tobacco  in the last 2 yrs please stop smoking, stop any regular Alcohol  and or any Recreational drug use.  Wear Seat belts while driving.  Please note: You were cared for by a hospitalist during your hospital stay. Once you are discharged, your primary care physician will handle any further medical issues. Please note that NO REFILLS for any discharge medications will be authorized once you are discharged, as it is imperative that you return to your primary care physician (or establish a relationship with a primary care physician if you do not have one) for your post hospital discharge needs so that they can reassess your need for medications and monitor your lab values.   Increase activity slowly   Complete by: As directed      Allergies as of 10/22/2019      Reactions   Hydrocodone Nausea And Vomiting   Methimazole Diarrhea   Liver toxicity   Ptu [propylthiouracil] Other (See Comments)   Liver toxicity   Latex Itching, Rash      Medication List    STOP taking these medications   hydrochlorothiazide 12.5 MG tablet Commonly known as: HYDRODIURIL   hydrOXYzine 25 MG tablet Commonly known as: ATARAX/VISTARIL Replaced by: hydrOXYzine 10 MG/5ML  syrup   methimazole 10 MG tablet Commonly known as: TAPAZOLE   Norethindrone Acetate-Ethinyl Estrad-FE 1-20 MG-MCG(24) tablet Commonly known as: LOESTRIN 24 FE   Prenatal Complete 14-0.4 MG Tabs   traZODone 50 MG tablet Commonly known as: DESYREL     TAKE these medications   atenolol 25 MG tablet Commonly known as: TENORMIN Take 0.5 tablets (12.5 mg total) by mouth 2 (two) times daily.   ferrous sulfate 325 (65 FE) MG EC tablet Take 325 mg by mouth daily with breakfast.   hydrOXYzine 10 MG/5ML syrup Commonly known as: ATARAX Take 5 mLs (10 mg total) by mouth 3 (three) times daily as needed for itching. Replaces: hydrOXYzine 25 MG tablet   melatonin 5 MG Tabs Take 5 mg by mouth at bedtime.       Follow-up [place] Follow up.   Why: [facility] be calling you to schedule a hospital followup and liver function test labs in 1 week. Contact information: [place] [address] [phone]       Constant, Mary Chafe, MD. Schedule an appointment as soon as possible for a visit in 1 week(s).   Specialty:  Obstetrics and Gynecology Contact information: [place] [address] [phone]              Allergies  Allergen Reactions   Hydrocodone Nausea And Vomiting   Methimazole Diarrhea    Liver toxicity    Ptu [Propylthiouracil] Other (See Comments)    Liver toxicity    Latex Itching and Rash      Other Procedures/Studies: US Abdomen Complete  Result Date: 10/19/2019 CLINICAL DATA:  Abnormal LFTs EXAM: ABDOMEN ULTRASOUND COMPLETE COMPARISON:  None. FINDINGS: Gallbladder: Gallbladder appears contracted with mild wall thickening, 4 mm. No visible stones or sonographic Murphy sign. Common bile duct: Diameter: Normal caliber, 3 mm Liver: No focal lesion identified. Within normal limits in parenchymal echogenicity. Portal vein is  patent on color Doppler imaging with normal direction of blood flow towards the liver. IVC: No abnormality visualized. Pancreas: Visualized portion unremarkable. Spleen: Size and appearance within normal limits. Right Kidney: Length: 12.5 cm. Echogenicity within normal limits. No mass or hydronephrosis visualized. Left Kidney: Length: 11.9 cm. Echogenicity within normal limits. No mass or hydronephrosis visualized. Abdominal aorta: No aneurysm visualized. Other findings: None. IMPRESSION: Contracted gallbladder with mild wall thickening. No visible gallstones. Electronically Signed   By: Rolm Baptise M.D.   On: 10/19/2019 15:53   [place] EKG SITE RITE  Result Date: 10/16/2019 If Site Rite image not attached, placement could not be confirmed due to current cardiac rhythm.  US Abdomen Limited RUQ  Result Date: 10/11/2019 CLINICAL DATA:  Elevated liver function EXAM: ULTRASOUND ABDOMEN LIMITED RIGHT UPPER QUADRANT COMPARISON:  None. FINDINGS: Gallbladder: No gallstones or wall thickening visualized. No sonographic Murphy sign noted by sonographer. Common bile duct: Diameter: 4.2 mm Liver: No focal lesion identified. Within normal limits in parenchymal echogenicity. Portal vein is patent on color Doppler imaging with normal direction of blood flow towards the liver. Other: None. IMPRESSION: Negative right upper quadrant abdominal ultrasound Electronically Signed   By: Donavan Foil M.D.   On: 10/11/2019 23:15     TODAY-DAY OF DISCHARGE:  Subjective:   Mary Frey today has no headache,no chest abdominal pain,no new weakness tingling or numbness, feels much better wants to go home today.   Objective:   Blood pressure (!) 154/98, pulse 60, temperature 98.2 F (36.8 C), temperature source Oral, resp. rate 17, height 5\' 5"  (1.651 m), weight 52 kg, SpO2 91 %.  Intake/Output Summary (Last 24 hours) at 10/22/2019 1535 Last data filed at 10/21/2019 1635 Gross per 24 hour  Intake 360 ml  Output --  Net 360  ml   Filed Weights   10/19/19 0531 10/20/19 0355 10/21/19 0452  Weight: 54.8 kg 56.4 kg 52 kg    Exam: Awake Alert, Oriented *3, No new F.N deficits, Normal affect [place].AT,PERRAL Supple Neck,No JVD, No cervical lymphadenopathy appriciated.  Symmetrical Chest wall movement, Good air movement bilaterally, CTAB RRR,No Gallops,Rubs or new Murmurs, No Parasternal Heave +ve B.Sounds, Abd Soft, Non tender, No organomegaly appriciated, No rebound -guarding or rigidity. No Cyanosis, Clubbing or edema, No new Rash or bruise   PERTINENT RADIOLOGIC STUDIES: No results found.   PERTINENT LAB RESULTS: CBC: Recent Labs    10/20/19 0500 10/22/19 1144  WBC 8.0 9.7  HGB 10.7* 11.6*  HCT 29.2* 32.0*  PLT 378 421*   CMET CMP     Component Value Date/Time   NA 137 10/20/2019 0500   K 3.8 10/20/2019 0500   CL 108 10/20/2019 0500   CO2 22 10/20/2019 0500  GLUCOSE 88 10/20/2019 0500   BUN 8 10/20/2019 0500   CREATININE 0.51 10/20/2019 0500   CALCIUM 9.3 10/20/2019 0500   PROT 7.6 10/22/2019 1144   ALBUMIN 2.8 (L) 10/22/2019 1144   AST 956 (H) 10/22/2019 1144   ALT 779 (H) 10/22/2019 1144   ALKPHOS 159 (H) 10/22/2019 1144   BILITOT 18.7 (HH) 10/22/2019 1144   GFRNONAA >60 10/20/2019 0500   GFRAA >60 10/20/2019 0500    GFR Estimated Creatinine Clearance: 75.2 mL/min (by C-G formula based on SCr of 0.51 mg/dL). No results for input(s): LIPASE, AMYLASE in the last 72 hours. No results for input(s): CKTOTAL, CKMB, CKMBINDEX, TROPONINI in the last 72 hours. Invalid input(s): POCBNP No results for input(s): DDIMER in the last 72 hours. No results for input(s): HGBA1C in the last 72 hours. No results for input(s): CHOL, HDL, LDLCALC, TRIG, CHOLHDL, LDLDIRECT in the last 72 hours. No results for input(s): TSH, T4TOTAL, T3FREE, THYROIDAB in the last 72 hours.  Invalid input(s): FREET3 No results for input(s): VITAMINB12, FOLATE, FERRITIN, TIBC, IRON, RETICCTPCT in the last 72  hours. Coags: Recent Labs    10/21/19 1423 10/22/19 1144  INR 1.0 1.1   Microbiology: No results found for this or any previous visit (from the past 240 hour(s)).  FURTHER DISCHARGE INSTRUCTIONS:  Get Medicines reviewed and adjusted: Please take all your medications with you for your next visit with your Primary MD  Laboratory/radiological data: Please request your Primary MD to go over all hospital tests and procedure/radiological results at the follow up, please ask your Primary MD to get all Hospital records sent to his/her office.  In some cases, they will be blood work, cultures and biopsy results pending at the time of your discharge. Please request that your primary care M.D. goes through all the records of your hospital data and follows up on these results.  Also Note the following: If you experience worsening of your admission symptoms, develop shortness of breath, life threatening emergency, suicidal or homicidal thoughts you must seek medical attention immediately by calling 911 or calling your MD immediately  if symptoms less severe.  You must read complete instructions/literature along with all the possible adverse reactions/side effects for all the Medicines you take and that have been prescribed to you. Take any new Medicines after you have completely understood and accpet all the possible adverse reactions/side effects.   Do not drive when taking Pain medications or sleeping medications (Benzodaizepines)  Do not take more than prescribed Pain, Sleep and Anxiety Medications. It is not advisable to combine anxiety,sleep and pain medications without talking with your primary care practitioner  Special Instructions: If you have smoked or chewed Tobacco  in the last 2 yrs please stop smoking, stop any regular Alcohol  and or any Recreational drug use.  Wear Seat belts while driving.  Please note: You were cared for by a hospitalist during your hospital stay. Once you are  discharged, your primary care physician will handle any further medical issues. Please note that NO REFILLS for any discharge medications will be authorized once you are discharged, as it is imperative that you return to your primary care physician (or establish a relationship with a primary care physician if you do not have one) for your post hospital discharge needs so that they can reassess your need for medications and monitor your lab values.  Total Time spent coordinating discharge including counseling, education and face to face time equals 35 minutes.  Signed: Oren Binet 10/22/2019  3:35 PM

## 2019-10-23 LAB — HSV DNA BY PCR (REFERENCE LAB)
HSV 1 DNA: NEGATIVE
HSV 2 DNA: NEGATIVE

## 2019-10-26 ENCOUNTER — Telehealth: Payer: Self-pay | Admitting: Obstetrics and Gynecology

## 2019-10-26 NOTE — Telephone Encounter (Signed)
Pt was transferred to our office from [facility] / Offered pt the option to be seen tomorrow by our provider at our [place] stated she does not want to go and be seen in a Bus . Stated she is would like to know her results ASAP as she has not been feeling good since she came out of the hospital. Discussed with the Lead Physician advised to call Hematology/oncology MD and possible discus results or if patient not feeling well return to ED/UC .Advised pt to go to an UC/ ED if symptoms are worsening or persist or start experiencing , chest pain diff breathing,  extreme fatigue,  Persistent nausea and vomiting, bleeding , rapid weight gain, severe uncontrolled pain, or visual disturbances. Verbalized understanding.

## 2019-10-26 NOTE — Telephone Encounter (Signed)
Please advise.  Patient has initial appointment at [facility] 7.22.2021

## 2019-10-26 NOTE — Telephone Encounter (Signed)
Patient calling back about results. She states it is very important that she receives a call back from clinical staff as soon as possible.

## 2019-10-26 NOTE — Telephone Encounter (Signed)
Patient called in and requested for lab results from July 1& 2nd. Please follow up at your earliest convenience.

## 2019-10-26 NOTE — Telephone Encounter (Addendum)
Critical lab results are available in chart. Pt called requesting lab results from labs done while in hospital. Pt has not established with PCP yet until appt 11/11/19.  Pt ph call disconnected in conversation.

## 2019-10-26 NOTE — Telephone Encounter (Signed)
Patient called back after being disconnected, the call transferred to [facility] at the office.

## 2019-10-26 NOTE — Telephone Encounter (Signed)
This is Dr. Harolyn Rutherford (covering for Dr. Elly Modena who is away currently).  Our office ([facility]) did not order labs on 10/21/19 and 10/22/19.  We are an OB/GYN office. She can follow up with [facility] or her PCP as planned.

## 2019-10-28 ENCOUNTER — Emergency Department (HOSPITAL_COMMUNITY): Payer: Medicaid - Out of State

## 2019-10-28 ENCOUNTER — Encounter (HOSPITAL_COMMUNITY): Payer: Self-pay | Admitting: Emergency Medicine

## 2019-10-28 ENCOUNTER — Other Ambulatory Visit: Payer: Self-pay

## 2019-10-28 ENCOUNTER — Inpatient Hospital Stay (HOSPITAL_COMMUNITY)
Admission: EM | Admit: 2019-10-28 | Discharge: 2019-11-03 | DRG: 441 | Disposition: A | Discharge status: Home / Self Care | Payer: Medicaid - Out of State | Attending: Internal Medicine | Admitting: Internal Medicine

## 2019-10-28 DIAGNOSIS — R945 Abnormal results of liver function studies: Secondary | ICD-10-CM | POA: Diagnosis not present

## 2019-10-28 DIAGNOSIS — Z885 Allergy status to narcotic agent status: Secondary | ICD-10-CM

## 2019-10-28 DIAGNOSIS — M549 Dorsalgia, unspecified: Secondary | ICD-10-CM | POA: Diagnosis present

## 2019-10-28 DIAGNOSIS — D259 Leiomyoma of uterus, unspecified: Secondary | ICD-10-CM | POA: Diagnosis present

## 2019-10-28 DIAGNOSIS — R17 Unspecified jaundice: Secondary | ICD-10-CM

## 2019-10-28 DIAGNOSIS — E43 Unspecified severe protein-calorie malnutrition: Secondary | ICD-10-CM | POA: Diagnosis present

## 2019-10-28 DIAGNOSIS — D509 Iron deficiency anemia, unspecified: Secondary | ICD-10-CM | POA: Diagnosis present

## 2019-10-28 DIAGNOSIS — F1410 Cocaine abuse, uncomplicated: Secondary | ICD-10-CM | POA: Diagnosis not present

## 2019-10-28 DIAGNOSIS — Z681 Body mass index (BMI) 19 or less, adult: Secondary | ICD-10-CM

## 2019-10-28 DIAGNOSIS — R350 Frequency of micturition: Secondary | ICD-10-CM | POA: Diagnosis not present

## 2019-10-28 DIAGNOSIS — K7290 Hepatic failure, unspecified without coma: Principal | ICD-10-CM | POA: Diagnosis present

## 2019-10-28 DIAGNOSIS — L299 Pruritus, unspecified: Secondary | ICD-10-CM | POA: Diagnosis present

## 2019-10-28 DIAGNOSIS — I10 Essential (primary) hypertension: Secondary | ICD-10-CM

## 2019-10-28 DIAGNOSIS — Z91040 Latex allergy status: Secondary | ICD-10-CM

## 2019-10-28 DIAGNOSIS — E0500 Thyrotoxicosis with diffuse goiter without thyrotoxic crisis or storm: Secondary | ICD-10-CM | POA: Diagnosis present

## 2019-10-28 DIAGNOSIS — R110 Nausea: Secondary | ICD-10-CM | POA: Diagnosis not present

## 2019-10-28 DIAGNOSIS — Z888 Allergy status to other drugs, medicaments and biological substances status: Secondary | ICD-10-CM

## 2019-10-28 DIAGNOSIS — Z20822 Contact with and (suspected) exposure to covid-19: Secondary | ICD-10-CM | POA: Diagnosis present

## 2019-10-28 DIAGNOSIS — Z833 Family history of diabetes mellitus: Secondary | ICD-10-CM

## 2019-10-28 DIAGNOSIS — Z823 Family history of stroke: Secondary | ICD-10-CM

## 2019-10-28 DIAGNOSIS — R634 Abnormal weight loss: Secondary | ICD-10-CM | POA: Diagnosis present

## 2019-10-28 DIAGNOSIS — I5022 Chronic systolic (congestive) heart failure: Secondary | ICD-10-CM | POA: Diagnosis present

## 2019-10-28 DIAGNOSIS — K7200 Acute and subacute hepatic failure without coma: Secondary | ICD-10-CM | POA: Diagnosis not present

## 2019-10-28 DIAGNOSIS — T382X5A Adverse effect of antithyroid drugs, initial encounter: Secondary | ICD-10-CM | POA: Diagnosis present

## 2019-10-28 DIAGNOSIS — Z803 Family history of malignant neoplasm of breast: Secondary | ICD-10-CM

## 2019-10-28 DIAGNOSIS — R197 Diarrhea, unspecified: Secondary | ICD-10-CM | POA: Diagnosis present

## 2019-10-28 DIAGNOSIS — F17200 Nicotine dependence, unspecified, uncomplicated: Secondary | ICD-10-CM | POA: Diagnosis present

## 2019-10-28 DIAGNOSIS — R7989 Other specified abnormal findings of blood chemistry: Secondary | ICD-10-CM | POA: Diagnosis present

## 2019-10-28 DIAGNOSIS — I110 Hypertensive heart disease with heart failure: Secondary | ICD-10-CM | POA: Diagnosis present

## 2019-10-28 DIAGNOSIS — G8929 Other chronic pain: Secondary | ICD-10-CM | POA: Diagnosis present

## 2019-10-28 DIAGNOSIS — F17210 Nicotine dependence, cigarettes, uncomplicated: Secondary | ICD-10-CM | POA: Diagnosis present

## 2019-10-28 LAB — URINALYSIS, ROUTINE W REFLEX MICROSCOPIC
Glucose, UA: 50 mg/dL — AB
Hgb urine dipstick: NEGATIVE
Ketones, ur: NEGATIVE mg/dL
Nitrite: NEGATIVE
Protein, ur: NEGATIVE mg/dL
Specific Gravity, Urine: 1.015 (ref 1.005–1.030)
pH: 6 (ref 5.0–8.0)

## 2019-10-28 LAB — COMPREHENSIVE METABOLIC PANEL
ALT: 528 U/L — ABNORMAL HIGH (ref 0–44)
AST: 747 U/L — ABNORMAL HIGH (ref 15–41)
Albumin: 3.2 g/dL — ABNORMAL LOW (ref 3.5–5.0)
Alkaline Phosphatase: 219 U/L — ABNORMAL HIGH (ref 38–126)
Anion gap: 11 (ref 5–15)
BUN: 10 mg/dL (ref 6–20)
CO2: 23 mmol/L (ref 22–32)
Calcium: 10.4 mg/dL — ABNORMAL HIGH (ref 8.9–10.3)
Chloride: 104 mmol/L (ref 98–111)
Creatinine, Ser: <0.3 mg/dL — ABNORMAL LOW (ref 0.44–1.00)
Glucose, Bld: 107 mg/dL — ABNORMAL HIGH (ref 70–99)
Potassium: 4 mmol/L (ref 3.5–5.1)
Sodium: 138 mmol/L (ref 135–145)
Total Bilirubin: 30 mg/dL (ref 0.3–1.2)
Total Protein: 8.5 g/dL — ABNORMAL HIGH (ref 6.5–8.1)

## 2019-10-28 LAB — CBC
HCT: 31.6 % — ABNORMAL LOW (ref 36.0–46.0)
Hemoglobin: 11.5 g/dL — ABNORMAL LOW (ref 12.0–15.0)
MCH: 26.1 pg (ref 26.0–34.0)
MCHC: 36.4 g/dL — ABNORMAL HIGH (ref 30.0–36.0)
MCV: 71.7 fL — ABNORMAL LOW (ref 80.0–100.0)
Platelets: 437 10*3/uL — ABNORMAL HIGH (ref 150–400)
RBC: 4.41 MIL/uL (ref 3.87–5.11)
RDW: 18.8 % — ABNORMAL HIGH (ref 11.5–15.5)
WBC: 9.8 10*3/uL (ref 4.0–10.5)
nRBC: 0 % (ref 0.0–0.2)

## 2019-10-28 LAB — PROTIME-INR
INR: 1.1 (ref 0.8–1.2)
Prothrombin Time: 13.4 seconds (ref 11.4–15.2)

## 2019-10-28 LAB — I-STAT BETA HCG BLOOD, ED (MC, WL, AP ONLY): I-stat hCG, quantitative: <5 m[IU]/mL (ref <5)

## 2019-10-28 LAB — APTT: aPTT: 33 seconds (ref 24–36)

## 2019-10-28 LAB — LIPASE, BLOOD: Lipase: 34 U/L (ref 11–51)

## 2019-10-28 MED ORDER — SODIUM CHLORIDE 0.9 % IV SOLN: INTRAVENOUS | Status: DC

## 2019-10-28 MED ORDER — IOHEXOL 300 MG/ML  SOLN
100.0000 mL | Freq: Once | INTRAMUSCULAR | Status: AC | PRN
  Administered 2019-10-28: 100 mL via INTRAVENOUS

## 2019-10-28 MED ORDER — SODIUM CHLORIDE (PF) 0.9 % IJ SOLN
INTRAMUSCULAR | Status: AC
  Filled 2019-10-28: qty 50

## 2019-10-28 NOTE — ED Triage Notes (Addendum)
Patient c/o diarrhea x2 months. Reports recent admission for same. Denies abdominal pain and N/V. Patient reports hx elevated liver enzymes and requesting labs.

## 2019-10-28 NOTE — ED Provider Notes (Signed)
[facility] DEPT Provider Note   CSN: [id number] Arrival date & time: 10/28/19  1950     History Chief Complaint  Patient presents with   Diarrhea    Mary Frey is a 44 y.o. female.  44 year old female with history of hepatitis of unknown etiology thought to maybe due to medications who presents with increased abdominal discomfort.  Patient was discharged from the hospital on the second of this month after work-up there did not reveal an etiology.  Scheduled to have a liver biopsy tomorrow.  Denies any fever or emesis.  No black or bloody stools.  Patient having recommended for abdominal CT which is why she is here at this time.        Past Medical History:  Diagnosis Date   Anemia    Chronic back pain    Fibroids    Graves disease    Hypertension    Pregnancy induced hypertension     Patient Active Problem List   Diagnosis Date Noted   Abnormal LFTs 10/12/2019   History of use of hepatotoxic drug 10/12/2019   Abnormal liver enzymes 10/12/2019   Other pancytopenia ([place]) 06/22/2019   Cobalamin deficiency 06/22/2019   Cocaine abuse ([place]) 01/12/2013   Protein-calorie malnutrition, severe ([place]) 01/12/2013   Graves disease 88/50/2774   Acute systolic CHF (congestive heart failure) ([place]) 01/12/2013   HTN (hypertension) 01/12/2013   Microcytic anemia 01/12/2013   Tobacco use disorder 01/12/2013    Past Surgical History:  Procedure Laterality Date   NO PAST SURGERIES       OB History    Gravida  5   Para  3   Term  3   Preterm      AB  2   Living  3     SAB      TAB  1   Ectopic  1   Multiple      Live Births              Family History  Problem Relation Age of Onset   Diabetes Maternal Aunt    Diabetes Maternal Uncle    Diabetes Maternal Grandmother    Diabetes Maternal Grandfather    Stroke Paternal Grandfather    Breast cancer Mother     Social History   Tobacco Use     Smoking status: Light Tobacco Smoker    Years: 14.00    Types: Cigarettes   Smokeless tobacco: Never Used  Substance Use Topics   Alcohol use: Yes    Alcohol/week: 0.0 standard drinks    Comment: on occasion   Drug use: No    Types: Cocaine    Home Medications Prior to Admission medications   Medication Sig Start Date End Date Taking? Authorizing Provider  atenolol (TENORMIN) 25 MG tablet Take 0.5 tablets (12.5 mg total) by mouth 2 (two) times daily. 10/22/19   Ghimire, Henreitta Leber, MD  ferrous sulfate 325 (65 FE) MG EC tablet Take 325 mg by mouth daily with breakfast.    [provider]  hydrOXYzine (ATARAX) 10 MG/5ML syrup Take 5 mLs (10 mg total) by mouth 3 (three) times daily as needed for itching. 10/22/19   Ghimire, Henreitta Leber, MD  melatonin 5 MG TABS Take 5 mg by mouth at bedtime.    [provider]  norethindrone (MICRONOR,CAMILA,ERRIN) 0.35 MG tablet Take 1 tablet (0.35 mg total) by mouth daily. Patient not taking: Reported on 06/27/2015 03/30/14 03/30/19  Lahoma Crocker, MD  propranolol (INDERAL) 20 MG tablet Take 1 tablet (20 mg total) by mouth 2 (two) times daily. Patient not taking: Reported on 06/27/2015 08/12/13 03/30/19  Philemon Kingdom, MD    Allergies    Hydrocodone, Methimazole, Ptu [propylthiouracil], and Latex  Review of Systems   Review of Systems  All other systems reviewed and are negative.   Physical Exam Updated Vital Signs BP (!) 151/95 (BP Location: Right Arm)    Pulse 75    Temp 98 F (36.7 C) (Oral)    Resp 18    SpO2 100%   Physical Exam Vitals and nursing note reviewed.  Constitutional:      General: She is not in acute distress.    Appearance: Normal appearance. She is well-developed. She is not toxic-appearing.  HENT:     Head: Normocephalic and atraumatic.  Eyes:     General: Lids are normal.     Conjunctiva/sclera: Conjunctivae normal.     Pupils: Pupils are equal, round, and reactive to light.  Neck:     Thyroid:  No thyroid mass.     Trachea: No tracheal deviation.  Cardiovascular:     Rate and Rhythm: Normal rate and regular rhythm.     Heart sounds: Normal heart sounds. No murmur heard.  No gallop.   Pulmonary:     Effort: Pulmonary effort is normal. No respiratory distress.     Breath sounds: Normal breath sounds. No stridor. No decreased breath sounds, wheezing, rhonchi or rales.  Abdominal:     General: Bowel sounds are normal. There is no distension.     Palpations: Abdomen is soft.     Tenderness: There is no abdominal tenderness. There is no rebound.  Musculoskeletal:        General: No tenderness. Normal range of motion.     Cervical back: Normal range of motion and neck supple.  Skin:    General: Skin is warm and dry.     Findings: No abrasion or rash.  Neurological:     Mental Status: She is alert and oriented to person, place, and time.     GCS: GCS eye subscore is 4. GCS verbal subscore is 5. GCS motor subscore is 6.     Cranial Nerves: No cranial nerve deficit.     Sensory: No sensory deficit.  Psychiatric:        Speech: Speech normal.        Behavior: Behavior normal.     ED Results / Procedures / Treatments   Labs (all labs ordered are listed, but only abnormal results are displayed) Labs Reviewed  COMPREHENSIVE METABOLIC PANEL - Abnormal; Notable for the following components:      Result Value   Glucose, Bld 107 (*)    Creatinine, Ser <0.30 (*)    Calcium 10.4 (*)    Total Protein 8.5 (*)    Albumin 3.2 (*)    AST 747 (*)    ALT 528 (*)    Alkaline Phosphatase 219 (*)    Total Bilirubin 30.0 (*)    All other components within normal limits  CBC - Abnormal; Notable for the following components:   Hemoglobin 11.5 (*)    HCT 31.6 (*)    MCV 71.7 (*)    MCHC 36.4 (*)    RDW 18.8 (*)    Platelets 437 (*)    All other components within normal limits  LIPASE, BLOOD  URINALYSIS, ROUTINE W REFLEX MICROSCOPIC  PROTIME-INR  APTT  I-STAT BETA HCG  BLOOD, ED ([facility],  [facility], [facility] ONLY)    EKG None  Radiology No results found.  Procedures Procedures (including critical care time)  Medications Ordered in ED Medications  0.9 %  sodium chloride infusion (has no administration in time range)    ED Course  I have reviewed the triage vital signs and the nursing notes.  Pertinent labs & imaging results that were available during my care of the patient were reviewed by me and considered in my medical decision making (see chart for details).    MDM Rules/Calculators/A&P                          Patient is urinalysis likely contaminated.  Patient's bilirubin is worsened here.  Transaminases have slightly improved.  But they remain elevated.  She does have evidence of scleral icterus.  Patient is CT of abdomen pelvis without acute findings.  Patient has no abdominal pain at this time.  Will consult hospitalist for readmission likely liver biopsy to try to find the etiology of her hepatitis Final Clinical Impression(s) / ED Diagnoses Final diagnoses:  None    Rx / DC Orders ED Discharge Orders    None       Lacretia Leigh, MD 10/28/19 2355

## 2019-10-28 NOTE — H&P (Signed)
Mary Frey YQM:[id number] DOB: [birth year] DOA: 10/28/2019     PCP: Patient, No Pcp Per   Outpatient Specialists:   NONE    Patient arrived to ER on 10/28/19 at 1950 Referred by Attending Lacretia Leigh, MD   Patient coming from: home Lives alone,     Chief Complaint:   Chief Complaint  Patient presents with  . Diarrhea    HPI: Mary Frey is a 44 y.o. female with medical history significant of Liver disease  Anemia, chronic back pain, fibroids, Graves' disease, hypertension, history of substance abuse.  Systolic heart failure, tobacco abuse  Presented with persistently not feeling good since her discharge on the second  As per review of records patient was trying to obtain her lab results from her new PCP office they wanted to see her in the office first but patient had difficulties getting them and stated that she did not want to be seen in the past.  They advised patient if she is not feeling well to present back to emergency department which she did Patient states she has been having diarrhea for the past 2 months no abdominal pain no nausea no vomiting no fever Patient recently was diagnosed with possible hepatitis of unclear etiology felt to be drug-induced.  Work-up was inconclusive.  She is supposed to undergo liver biopsy tomorrow she has not had any fevers or nausea no black stools no melena but she does endorse diarrhea.   Denies EtOh abuse,, states she quit smoking a long time ago, denies using acetaminophen,  States her liver failure came from toxins in the air or at her job when she worked  History significant for patient developing Graves' disease she stopped taking her methimazole in December 2020 and then had hyperthyroidism and was restarted on her methimazole she came to the emergency department originally with abdominal pain and diarrhea and was found to have hepatitis initial thought to be secondary to methimazole toxicity she continued to have elevated  LFTs [facility]  GI consulted and recommended liver biopsy patient refused and left AMA on the second for July plan was for her to have biopsy done as an outpatient.  During her prior hospitalization 21st and 29 June she had unremarkable right upper quadrant ultrasound. Work-up so far included .Acute hepatitis serology negative. AMA, ASMA negative. IgM EBV and IgM CMV negative IgM HSV 1 +ve initially ID felt that HSV hepatitis was a possibility GI recommended liver biopsy to confirm as well as HSV PCR and HCV PCR On 1 July patient was seen by interventional radiology but refused to sign a consent. Patient hospital stay was complicated by the patient refusing labs CT scan of the abdomen no HIV testing.  She was found to have positive ANA and SSA antibody but SSA titer mildly elevated and double-stranded DNA was negative she was supposed to follow-up as an outpatient rheumatology  Infectious risk factors:  Reports Diarrhea    Has  NOt been vaccinated against COVID (thinking about it)   in house  PCR testing  Pending  Lab Results  Component Value Date   [place] NEGATIVE 10/12/2019    Regarding pertinent Chronic problems:      HTN on atenolol   chronic CHF  systolic/ combined - last echo in 2014 no results in the system  Hyperthyroidism:  Lab Results  Component Value Date   TSH <0.010 (L) 10/12/2019  holding methemazole      Liver disease MELD-Na score: 19 at 10/22/2019 11:44  AM      Chronic anemia - baseline hg Hemoglobin & Hematocrit  Recent Labs    10/20/19 0500 10/22/19 1144 10/28/19 2022  HGB 10.7* 11.6* 11.5*     While in ER: Noted to have persistent LFT elevation AST 747 ALT 528 total bilirubin up to 30 Albumin 3.2 Patient was recommended to get CT scan done which was done showed no acute findings   CT scan done Refuses CXR( too much radiation) Hospitalist was called for admission for Progressive Liver disease  The following Work up has been ordered so  far:  Orders Placed This Encounter  Procedures  . CT Abdomen Pelvis W Contrast  . Lipase, blood  . Comprehensive metabolic panel  . CBC  . Urinalysis, Routine w reflex microscopic  . Protime-INR  . APTT  . Diet NPO time specified  . Saline Lock IV, Maintain IV access  . Consult to hospitalist  . I-Stat beta hCG blood, ED    Following Medications were ordered in ER: Medications  0.9 %  sodium chloride infusion ( Intravenous New Bag/Given 10/28/19 2239)  sodium chloride (PF) 0.9 % injection (has no administration in time range)  iohexol (OMNIPAQUE) 300 MG/ML solution 100 mL (100 mLs Intravenous Contrast Given 10/28/19 2302)        Consult Orders  (From admission, onward)         Start     Ordered   10/28/19 2352  Consult to hospitalist  Once       Provider:  (Not yet assigned)  Question Answer Comment  Place call to: [facility]   Reason for Consult Admit      10/28/19 2351          Significant initial  Findings: Abnormal Labs Reviewed  COMPREHENSIVE METABOLIC PANEL - Abnormal; Notable for the following components:      Result Value   Glucose, Bld 107 (*)    Creatinine, Ser <0.30 (*)    Calcium 10.4 (*)    Total Protein 8.5 (*)    Albumin 3.2 (*)    AST 747 (*)    ALT 528 (*)    Alkaline Phosphatase 219 (*)    Total Bilirubin 30.0 (*)    All other components within normal limits  CBC - Abnormal; Notable for the following components:   Hemoglobin 11.5 (*)    HCT 31.6 (*)    MCV 71.7 (*)    MCHC 36.4 (*)    RDW 18.8 (*)    Platelets 437 (*)    All other components within normal limits  URINALYSIS, ROUTINE W REFLEX MICROSCOPIC - Abnormal; Notable for the following components:   Color, Urine AMBER (*)    APPearance CLOUDY (*)    Glucose, UA 50 (*)    Bilirubin Urine MODERATE (*)    Leukocytes,Ua SMALL (*)    Bacteria, UA FEW (*)    All other components within normal limits    Otherwise labs showing:    Recent Labs  Lab 10/28/19 2022  NA 138   K 4.0  CO2 23  GLUCOSE 107*  BUN 10  CREATININE <0.30*  CALCIUM 10.4*    Cr down from baseline see below Lab Results  Component Value Date   CREATININE <0.30 (L) 10/28/2019   CREATININE 0.51 10/20/2019   CREATININE 0.73 10/19/2019    Recent Labs  Lab 10/22/19 1144 10/28/19 2022  AST 956* 747*  ALT 779* 528*  ALKPHOS 159* 219*  BILITOT 18.7* 30.0*  PROT 7.6  8.5*  ALBUMIN 2.8* 3.2*   Lab Results  Component Value Date   CALCIUM 10.4 (H) 10/28/2019   PHOS 4.1 01/14/2013   WBC      Component Value Date/Time   WBC 9.8 10/28/2019 2022   ANC    Component Value Date/Time   NEUTROABS 6.8 10/22/2019 1144   ALC No components found for: LYMPHAB    Plt: Lab Results  Component Value Date   PLT 437 (H) 10/28/2019      COVID-19 Labs  No results for input(s): DDIMER, FERRITIN, LDH, CRP in the last 72 hours.  Lab Results  Component Value Date   [place] NEGATIVE 10/12/2019     HG/HCT   stable,      Component Value Date/Time   HGB 11.5 (L) 10/28/2019 2022   HCT 31.6 (L) 10/28/2019 2022    Recent Labs  Lab 10/28/19 2022  LIPASE 34    ECG:  Ordered      UA   no evidence of UTI      Urine analysis:    Component Value Date/Time   COLORURINE AMBER (A) 10/28/2019 2130   APPEARANCEUR CLOUDY (A) 10/28/2019 2130   LABSPEC 1.015 10/28/2019 2130   PHURINE 6.0 10/28/2019 2130   GLUCOSEU 50 (A) 10/28/2019 2130   HGBUR NEGATIVE 10/28/2019 2130   BILIRUBINUR MODERATE (A) 10/28/2019 2130   KETONESUR NEGATIVE 10/28/2019 2130   PROTEINUR NEGATIVE 10/28/2019 2130   UROBILINOGEN 0.2 03/30/2019 1215   NITRITE NEGATIVE 10/28/2019 2130   LEUKOCYTESUR SMALL (A) 10/28/2019 2130      Ordered    CTabd/pelvis - nonacute     ED Triage Vitals  Enc Vitals Group     BP 10/28/19 2006 (!) 151/95     Pulse Rate 10/28/19 2006 75     Resp 10/28/19 2006 18     Temp 10/28/19 2006 98 F (36.7 C)     Temp Source 10/28/19 2006 Oral     SpO2 10/28/19 2006 100 %      Weight --      Height --      Head Circumference --      Peak Flow --      Pain Score 10/28/19 2014 0     Pain Loc --      Pain Edu? --      Excl. in [place]? --   TMAX(24)@      Latest  Blood pressure (!) 152/101, pulse 71, temperature 98 F (36.7 C), temperature source Oral, resp. rate 18, SpO2 99 %.     Review of Systems:    Pertinent positives include:   Fatigue, diarrhea,  Constitutional:  No weight loss, night sweats, Fevers, chills, weight loss  HEENT:  No headaches, Difficulty swallowing,Tooth/dental problems,Sore throat,  No sneezing, itching, ear ache, nasal congestion, post nasal drip,  Cardio-vascular:  No chest pain, Orthopnea, PND, anasarca, dizziness, palpitations.no Bilateral lower extremity swelling  GI:  No heartburn, indigestion, abdominal pain, nausea, vomiting,  change in bowel habits, loss of appetite, melena, blood in stool, hematemesis Resp:  no shortness of breath at rest. No dyspnea on exertion, No excess mucus, no productive cough, No non-productive cough, No coughing up of blood.No change in color of mucus.No wheezing. Skin:  no rash or lesions. No jaundice GU:  no dysuria, change in color of urine, no urgency or frequency. No straining to urinate.  No flank pain.  Musculoskeletal:  No joint pain or no joint swelling. No decreased range of motion. No back  pain.  Psych:  No change in mood or affect. No depression or anxiety. No memory loss.  Neuro: no localizing neurological complaints, no tingling, no weakness, no double vision, no gait abnormality, no slurred speech, no confusion  All systems reviewed and apart from [place] all are negative  Past Medical History:   Past Medical History:  Diagnosis Date  . Anemia   . Chronic back pain   . Fibroids   . Graves disease   . Hypertension   . Pregnancy induced hypertension      Past Surgical History:  Procedure Laterality Date  . NO PAST SURGERIES      Social History:  Ambulatory   independently       reports that she has been smoking cigarettes. She has smoked for the past 14.00 years. She has never used smokeless tobacco. She reports current alcohol use. She reports that she does not use drugs.  Family History:   Family History  Problem Relation Age of Onset  . Diabetes Maternal Aunt   . Diabetes Maternal Uncle   . Diabetes Maternal Grandmother   . Diabetes Maternal Grandfather   . Stroke Paternal Grandfather   . Breast cancer Mother     Allergies: Allergies  Allergen Reactions  . Hydrocodone Nausea And Vomiting  . Methimazole Diarrhea    Liver toxicity   . Ptu [Propylthiouracil] Other (See Comments)    Liver toxicity   . Latex Itching and Rash    Prior to Admission medications   Medication Sig Start Date End Date Taking? Authorizing Provider  atenolol (TENORMIN) 25 MG tablet Take 0.5 tablets (12.5 mg total) by mouth 2 (two) times daily. 10/22/19  Yes Ghimire, Henreitta Leber, MD  FEROSUL 325 (65 Fe) MG tablet Take 325 mg by mouth daily. 10/25/19  Yes [provider]  hydrOXYzine (ATARAX) 10 MG/5ML syrup Take 5 mLs (10 mg total) by mouth 3 (three) times daily as needed for itching. 10/22/19  Yes Ghimire, Henreitta Leber, MD  melatonin 5 MG TABS Take 5 mg by mouth at bedtime.   Yes [provider]  Multiple Vitamins-Minerals (MULTIVITAMIN ADULTS PO) Take 1 tablet by mouth daily.   Yes [provider]  ferrous sulfate 325 (65 FE) MG EC tablet Take 325 mg by mouth daily with breakfast. Patient not taking: Reported on 10/28/2019    [provider]  norethindrone (MICRONOR,CAMILA,ERRIN) 0.35 MG tablet Take 1 tablet (0.35 mg total) by mouth daily. Patient not taking: Reported on 06/27/2015 03/30/14 03/30/19  Lahoma Crocker, MD  propranolol (INDERAL) 20 MG tablet Take 1 tablet (20 mg total) by mouth 2 (two) times daily. Patient not taking: Reported on 06/27/2015 08/12/13 03/30/19  Philemon Kingdom, MD   Physical Exam: Vitals with BMI  10/28/2019 10/28/2019 10/28/2019  Height - - -  Weight - - -  BMI - - -  Systolic 734 193 790  Diastolic 240 90 95  Pulse 71 71 75     1. General:  in No Acute distress   Chronically ill  -appearing 2. Psychological: Alert and  Oriented 3. Head/ENT:     Dry Mucous Membranes                          Head Non traumatic, neck supple                            Poor Dentition 4. SKIN:   decreased  Skin turgor,  Skin clean Dry and intact no rash 5. Heart: Regular rate and rhythm no  Murmur, no Rub or gallop 6. Lungs:  Clear to auscultation bilaterally, no wheezes or crackles   7. Abdomen: Soft,  non-tender, Non distended  bowel sounds present 8. Lower extremities: no clubbing, cyanosis, no  edema 9. Neurologically Grossly intact, moving all 4 extremities equally   10. MSK: Normal range of motion   All other LABS:     Recent Labs  Lab 10/22/19 1144 10/28/19 2022  WBC 9.7 9.8  NEUTROABS 6.8  --   HGB 11.6* 11.5*  HCT 32.0* 31.6*  MCV 69.4* 71.7*  PLT 421* 437*     Recent Labs  Lab 10/28/19 2022  NA 138  K 4.0  CL 104  CO2 23  GLUCOSE 107*  BUN 10  CREATININE <0.30*  CALCIUM 10.4*     Recent Labs  Lab 10/22/19 1144 10/28/19 2022  AST 956* 747*  ALT 779* 528*  ALKPHOS 159* 219*  BILITOT 18.7* 30.0*  PROT 7.6 8.5*  ALBUMIN 2.8* 3.2*       Cultures:    Component Value Date/Time   SDES URINE, CLEAN CATCH 01/17/2016 1202   SPECREQUEST NONE 01/17/2016 1202   CULT MULTIPLE SPECIES PRESENT, SUGGEST RECOLLECTION (A) 01/17/2016 1202   REPTSTATUS 01/18/2016 FINAL 01/17/2016 1202     Radiological Exams on Admission: CT Abdomen Pelvis W Contrast  Result Date: 10/28/2019 CLINICAL DATA:  Abdomen distension and diarrhea EXAM: CT ABDOMEN AND PELVIS WITH CONTRAST TECHNIQUE: Multidetector CT imaging of the abdomen and pelvis was performed using the standard protocol following bolus administration of intravenous contrast. CONTRAST:  12mL OMNIPAQUE IOHEXOL 300 MG/ML  SOLN  COMPARISON:  Ultrasound 10/19/2019, 03/15/2015 FINDINGS: Lower chest: No acute abnormality. Hepatobiliary: No focal liver abnormality is seen. No gallstones, gallbladder wall thickening, or biliary dilatation. Pancreas: Unremarkable. No pancreatic ductal dilatation or surrounding inflammatory changes. Spleen: Normal in size without focal abnormality. Adrenals/Urinary Tract: Adrenal glands are unremarkable. Kidneys are normal, without renal calculi, focal lesion, or hydronephrosis. Bladder is unremarkable. Stomach/Bowel: Stomach is within normal limits. Appendix appears normal. No evidence of bowel wall thickening, distention, or inflammatory changes. Vascular/Lymphatic: No significant vascular findings are present. No enlarged abdominal or pelvic lymph nodes. Reproductive: Heterogenous posterior fundal uterine mass measuring 6.3 by 6.1 by 4.9 cm, consistent with fibroid. 4.6 x 4.6 cm right adnexal cyst. Other: Negative for free air or free fluid. Musculoskeletal: No acute or significant osseous findings. IMPRESSION: 1. No CT evidence for acute intra-abdominal or pelvic abnormality. 2. Heterogenous posterior fundal fibroid. 4.6 cm right adnexal cyst. Electronically Signed   By: Donavan Foil M.D.   On: 10/28/2019 23:29    Chart has been reviewed    Assessment/Plan  44 y.o. female with medical history significant of Liver disease  Anemia, chronic back pain, fibroids, Graves' disease, hypertension, history of substance abuse.  Systolic heart failure, tobacco abuse Admitted for aggressive treatment of her liver disease number worsening bilirubin  Present on Admission: . Liver failure (HCC) Abnormal LFTs -have had extensive work-up in the past which made difficult by the patient refusal of lab draws and certain tests.  Discussed with GI appreciate their input will see patient in consult tomorrow.  At this point patient seem to be willing to undergo liver biopsy although still thinking about it.  Order IR  consult and asked patient not to eat   Hyperthyroidism -methimazole on hold  . HTN (hypertension) continue atenolol  . Microcytic  anemia obtain anemia panel in the morning  . Protein-calorie malnutrition, severe ([place]) -check prealbumin order nutritional consult  . Tobacco use disorder currently in remission  Possible malnutrition will further evaluate with prealbumin and nutritional consult when able to tolerate p.o. Other plan as per orders.  DVT prophylaxis:  SCD      Code Status:  FULL CODE as per patient   I had personally discussed CODE STATUS with patient     Family Communication:   Family not at  Bedside    Disposition Plan:   To home once workup is complete and patient is stable   Following barriers for discharge: LFTs stabilize                               Will need consultants to evaluate patient prior to discharge   EXPECT DC tomorrow                                     Nutrition    consulted                                  Consults called:    Howie Ill aware spoke to Dr. Penelope Coop will see in AM, IR consult ordered through epic  Admission status:  ED Disposition    ED Disposition Condition [place]: [place] [100102]  Level of Care: Med-Surg [16]  Covid Evaluation: Asymptomatic Screening Protocol (No Symptoms)  Diagnosis: Liver failure [facility]) [[id number]]  Admitting Physician: Toy Baker [[id number]]  Attending Physician: Toy Baker [[id number]]        Obs     Level of care         medical floor        Lab Results  Component Value Date   [place] 10/12/2019     Precautions: admitted as  asymptomatic screening protocol    PPE: Used by the provider:   N95  eye Goggles,  Gloves     Jaeshaun Riva 10/28/2019, 1:08 AM    [facility]     after 2 AM please page floor coverage PA If 7AM-7PM, please contact the day team taking care of the patient using [email or web address]    Patient was evaluated in the context of the global COVID-19 pandemic, which necessitated consideration that the patient might be at risk for infection with the SARS-CoV-2 virus that causes COVID-19. Institutional protocols and algorithms that pertain to the evaluation of patients at risk for COVID-19 are in a state of rapid change based on information released by regulatory bodies including the CDC and federal and state organizations. These policies and algorithms were followed during the patient's care.

## 2019-10-28 NOTE — ED Notes (Signed)
Date and time results received: 10/28/19 10:01 PM (use smartphrase ".now" to insert current time)  Test: bilirubin Critical Value: 30.2  Name of Provider Notified: Lacretia Leigh  Orders Received? Or Actions Taken?: Orders Received - See Orders for details

## 2019-10-29 ENCOUNTER — Emergency Department (HOSPITAL_COMMUNITY): Payer: Medicaid - Out of State

## 2019-10-29 ENCOUNTER — Encounter (HOSPITAL_COMMUNITY): Payer: Self-pay | Admitting: Internal Medicine

## 2019-10-29 ENCOUNTER — Observation Stay (HOSPITAL_COMMUNITY): Payer: Medicaid - Out of State

## 2019-10-29 DIAGNOSIS — K7290 Hepatic failure, unspecified without coma: Secondary | ICD-10-CM | POA: Diagnosis present

## 2019-10-29 DIAGNOSIS — R945 Abnormal results of liver function studies: Secondary | ICD-10-CM | POA: Diagnosis not present

## 2019-10-29 HISTORY — PX: IR US GUIDE BX ASP/DRAIN: IMG2392

## 2019-10-29 LAB — CBC WITH DIFFERENTIAL/PLATELET
Abs Immature Granulocytes: 0.03 10*3/uL (ref 0.00–0.07)
Basophils Absolute: 0 10*3/uL (ref 0.0–0.1)
Basophils Relative: 0 %
Eosinophils Absolute: 0.2 10*3/uL (ref 0.0–0.5)
Eosinophils Relative: 3 %
HCT: 27.2 % — ABNORMAL LOW (ref 36.0–46.0)
Hemoglobin: 10 g/dL — ABNORMAL LOW (ref 12.0–15.0)
Immature Granulocytes: 0 %
Lymphocytes Relative: 19 %
Lymphs Abs: 1.5 10*3/uL (ref 0.7–4.0)
MCH: 26 pg (ref 26.0–34.0)
MCHC: 36.8 g/dL — ABNORMAL HIGH (ref 30.0–36.0)
MCV: 70.6 fL — ABNORMAL LOW (ref 80.0–100.0)
Monocytes Absolute: 1.1 10*3/uL — ABNORMAL HIGH (ref 0.1–1.0)
Monocytes Relative: 13 %
Neutro Abs: 5.4 10*3/uL (ref 1.7–7.7)
Neutrophils Relative %: 65 %
Platelets: 387 10*3/uL (ref 150–400)
RBC: 3.85 MIL/uL — ABNORMAL LOW (ref 3.87–5.11)
RDW: 18.5 % — ABNORMAL HIGH (ref 11.5–15.5)
WBC: 8.2 10*3/uL (ref 4.0–10.5)
nRBC: 0 % (ref 0.0–0.2)

## 2019-10-29 LAB — LIPID PANEL
Cholesterol: 147 mg/dL (ref 0–200)
HDL: <10 mg/dL — ABNORMAL LOW (ref >40)
Triglycerides: 327 mg/dL — ABNORMAL HIGH (ref <150)
VLDL: 65 mg/dL — ABNORMAL HIGH (ref 0–40)

## 2019-10-29 LAB — COMPREHENSIVE METABOLIC PANEL
ALT: 453 U/L — ABNORMAL HIGH (ref 0–44)
AST: 632 U/L — ABNORMAL HIGH (ref 15–41)
Albumin: 2.6 g/dL — ABNORMAL LOW (ref 3.5–5.0)
Alkaline Phosphatase: 179 U/L — ABNORMAL HIGH (ref 38–126)
Anion gap: 8 (ref 5–15)
BUN: 9 mg/dL (ref 6–20)
CO2: 24 mmol/L (ref 22–32)
Calcium: 9.7 mg/dL (ref 8.9–10.3)
Chloride: 106 mmol/L (ref 98–111)
Creatinine, Ser: <0.3 mg/dL — ABNORMAL LOW (ref 0.44–1.00)
Glucose, Bld: 115 mg/dL — ABNORMAL HIGH (ref 70–99)
Potassium: 3.2 mmol/L — ABNORMAL LOW (ref 3.5–5.1)
Sodium: 138 mmol/L (ref 135–145)
Total Bilirubin: 25.4 mg/dL (ref 0.3–1.2)
Total Protein: 7 g/dL (ref 6.5–8.1)

## 2019-10-29 LAB — RAPID URINE DRUG SCREEN, HOSP PERFORMED
Amphetamines: NOT DETECTED
Barbiturates: NOT DETECTED
Benzodiazepines: NOT DETECTED
Cocaine: NOT DETECTED
Opiates: NOT DETECTED
Tetrahydrocannabinol: NOT DETECTED

## 2019-10-29 LAB — AMMONIA: Ammonia: 34 umol/L (ref 9–35)

## 2019-10-29 LAB — BILIRUBIN, FRACTIONATED(TOT/DIR/INDIR)
Bilirubin, Direct: 14.3 mg/dL — ABNORMAL HIGH (ref 0.0–0.2)
Indirect Bilirubin: 11.2 mg/dL — ABNORMAL HIGH (ref 0.3–0.9)
Total Bilirubin: 25.5 mg/dL (ref 0.3–1.2)

## 2019-10-29 LAB — IRON AND TIBC
Iron: 207 ug/dL — ABNORMAL HIGH (ref 28–170)
Saturation Ratios: 86 % — ABNORMAL HIGH (ref 10.4–31.8)
TIBC: 241 ug/dL — ABNORMAL LOW (ref 250–450)
UIBC: 34 ug/dL

## 2019-10-29 LAB — VITAMIN B12: Vitamin B-12: 1116 pg/mL — ABNORMAL HIGH (ref 180–914)

## 2019-10-29 LAB — C DIFFICILE QUICK SCREEN W PCR REFLEX
C Diff antigen: NEGATIVE
C Diff interpretation: NOT DETECTED
C Diff toxin: NEGATIVE

## 2019-10-29 LAB — RETICULOCYTES
Immature Retic Fract: 10 % (ref 2.3–15.9)
RBC.: 3.74 MIL/uL — ABNORMAL LOW (ref 3.87–5.11)
Retic Count, Absolute: 100.6 10*3/uL (ref 19.0–186.0)
Retic Ct Pct: 2.7 % (ref 0.4–3.1)

## 2019-10-29 LAB — FOLATE: Folate: 16.6 ng/mL (ref >5.9)

## 2019-10-29 LAB — MAGNESIUM: Magnesium: 1.7 mg/dL (ref 1.7–2.4)

## 2019-10-29 LAB — FERRITIN: Ferritin: 620 ng/mL — ABNORMAL HIGH (ref 11–307)

## 2019-10-29 LAB — SARS CORONAVIRUS 2 BY RT PCR (HOSPITAL ORDER, PERFORMED IN ~~LOC~~ HOSPITAL LAB): SARS Coronavirus 2: NEGATIVE

## 2019-10-29 LAB — PHOSPHORUS: Phosphorus: 4 mg/dL (ref 2.5–4.6)

## 2019-10-29 LAB — CK: Total CK: 28 U/L — ABNORMAL LOW (ref 38–234)

## 2019-10-29 LAB — TSH: TSH: <0.01 u[IU]/mL — ABNORMAL LOW (ref 0.350–4.500)

## 2019-10-29 LAB — PREALBUMIN: Prealbumin: <5 mg/dL — ABNORMAL LOW (ref 18–38)

## 2019-10-29 LAB — BRAIN NATRIURETIC PEPTIDE: B Natriuretic Peptide: 59.4 pg/mL (ref 0.0–100.0)

## 2019-10-29 LAB — T4, FREE: Free T4: 4.46 ng/dL — ABNORMAL HIGH (ref 0.61–1.12)

## 2019-10-29 MED ORDER — FENTANYL CITRATE (PF) 100 MCG/2ML IJ SOLN
INTRAMUSCULAR | Status: AC | PRN
  Administered 2019-10-29 (×2): 50 ug via INTRAVENOUS

## 2019-10-29 MED ORDER — ONDANSETRON HCL 4 MG PO TABS: 4.0000 mg | ORAL_TABLET | Freq: Four times a day (QID) | ORAL | Status: DC | PRN

## 2019-10-29 MED ORDER — DIPHENHYDRAMINE HCL 50 MG/ML IJ SOLN
INTRAMUSCULAR | Status: AC | PRN
  Administered 2019-10-29: 25 mg via INTRAVENOUS

## 2019-10-29 MED ORDER — SODIUM CHLORIDE 0.9 % IV SOLN: INTRAVENOUS | Status: DC

## 2019-10-29 MED ORDER — POTASSIUM CHLORIDE CRYS ER 20 MEQ PO TBCR
40.0000 meq | EXTENDED_RELEASE_TABLET | Freq: Once | ORAL | Status: AC
  Administered 2019-10-29: 40 meq via ORAL
  Filled 2019-10-29: qty 2

## 2019-10-29 MED ORDER — MIDAZOLAM HCL 2 MG/2ML IJ SOLN
INTRAMUSCULAR | Status: AC | PRN
  Administered 2019-10-29: 2 mg via INTRAVENOUS

## 2019-10-29 MED ORDER — DIPHENHYDRAMINE HCL 50 MG/ML IJ SOLN
INTRAMUSCULAR | Status: AC
  Filled 2019-10-29: qty 1

## 2019-10-29 MED ORDER — GELATIN ABSORBABLE 12-7 MM EX MISC
CUTANEOUS | Status: AC
  Filled 2019-10-29: qty 1

## 2019-10-29 MED ORDER — LIDOCAINE HCL 1 % IJ SOLN
INTRAMUSCULAR | Status: AC
  Filled 2019-10-29: qty 20

## 2019-10-29 MED ORDER — ONDANSETRON HCL 4 MG/2ML IJ SOLN: 4.0000 mg | Freq: Four times a day (QID) | INTRAMUSCULAR | Status: DC | PRN

## 2019-10-29 MED ORDER — ATENOLOL 25 MG PO TABS
12.5000 mg | ORAL_TABLET | Freq: Two times a day (BID) | ORAL | Status: DC
  Administered 2019-10-29 – 2019-11-03 (×10): 12.5 mg via ORAL
  Filled 2019-10-29 (×4): qty 1
  Filled 2019-10-29: qty 0.5
  Filled 2019-10-29: qty 1
  Filled 2019-10-29: qty 0.5
  Filled 2019-10-29: qty 1
  Filled 2019-10-29: qty 0.5
  Filled 2019-10-29 (×3): qty 1

## 2019-10-29 MED ORDER — HYDROXYZINE HCL 10 MG/5ML PO SYRP
10.0000 mg | ORAL_SOLUTION | Freq: Once | ORAL | Status: DC
  Filled 2019-10-29 (×2): qty 5

## 2019-10-29 MED ORDER — FENTANYL CITRATE (PF) 100 MCG/2ML IJ SOLN
INTRAMUSCULAR | Status: AC
  Filled 2019-10-29: qty 2

## 2019-10-29 MED ORDER — SODIUM CHLORIDE 0.9% FLUSH
3.0000 mL | Freq: Two times a day (BID) | INTRAVENOUS | Status: DC
  Administered 2019-10-30: 3 mL via INTRAVENOUS

## 2019-10-29 MED ORDER — LIDOCAINE HCL (PF) 1 % IJ SOLN
INTRAMUSCULAR | Status: AC | PRN
  Administered 2019-10-29: 10 mL

## 2019-10-29 MED ORDER — MIDAZOLAM HCL 2 MG/2ML IJ SOLN
INTRAMUSCULAR | Status: AC
  Filled 2019-10-29: qty 4

## 2019-10-29 NOTE — Progress Notes (Signed)
Initially took report on this patient from ED nurse Abby.  Then patient room 1523 was re assigned to another patient. Was not made aware of this situation. Notified charge nurse Mollie Germany, RN. Kasia called AC and bed placement and was told that patient was being [facility] and that room 1523 was reassigned to another patient.  ED nurse called back and I gave her the update that I received from [facility].

## 2019-10-29 NOTE — ED Notes (Signed)
Pt refusing to keep BP cuff and pulse ox on, states it makes her itch. RN reminded pt that she is to remain NPO, pt refuses to stay NPO stating 'they won't do anything until 10 at night I'm not starving myself all day'. Pt asking RN if she should take liver supplement at bedside, RN advised against taking supplement.

## 2019-10-29 NOTE — ED Notes (Signed)
[facility] at bedside.  Pt refusing to go to [facility].

## 2019-10-29 NOTE — Procedures (Signed)
Interventional Radiology Procedure:   Indications: Elevated liver enzymes  Procedure: US guided liver biopsy  Findings: 3 cores from right hepatic lobe  Complications: None     EBL: less than 10 ml  Plan: Bedrest 3 hours   Lunabella Badgett R. Clayborn Milnes, MD  Pager: [phone]      

## 2019-10-29 NOTE — ED Notes (Signed)
Pt informed that she was being transferred to [facility] for further treatment.  Pt became disgruntled by this information, reporting poor experiences a [facility] in the past, does not want to go to [facility], "I don't like [facility], that's why I came here, I havent seen this doctor, I dont know this doctor"  Admitting provider [place] paged.  Awaiting answer.

## 2019-10-29 NOTE — Consult Note (Addendum)
Chief Complaint: Patient was seen in consultation today for hyperbilirubinemia  Referring Physician(s): Dr. Roel Cluck  Supervising Physician: Markus Daft  Patient Status: [facility] - ED  History of Present Illness: Mary Frey is a 44 y.o. female with past medical history of HTN, Graves disease recently admitted 6/21-7/3 for acute hepatitis, jaundice, worsening LFTs, hyperbilirubinemia.  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  Patient has been previously assessed for liver biopsy with IR, however ultimately refused to proceed with the procedure despite multiple conversations with several providers.  She was planning to continue work-up as an outpatient, however presented to the ED overnight with ongoing diarrhea and concern for her lab work.   Mary Frey is assessed at bedside this AM.  She has several concerns related to the procedure which are addressed.  Clearly explained the procedure.  Reviewed risks and benefits. She verbalizes that she is agreeable to proceed with biopsy today.   Instructed to be NPO as ordered.   Past Medical History:  Diagnosis Date  . Anemia   . Chronic back pain   . Fibroids   . Graves disease   . Hypertension   . Pregnancy induced hypertension     Past Surgical History:  Procedure Laterality Date  . NO PAST SURGERIES      Allergies: Hydrocodone, Methimazole, Ptu [propylthiouracil], and Latex  Medications: Prior to Admission medications   Medication Sig Start Date End Date Taking? Authorizing Provider  atenolol (TENORMIN) 25 MG tablet Take 0.5 tablets (12.5 mg total) by mouth 2 (two) times daily. 10/22/19  Yes Ghimire, Henreitta Leber, MD  FEROSUL 325 (65 Fe) MG tablet Take 325 mg by mouth daily. 10/25/19  Yes [provider]  hydrOXYzine (ATARAX) 10 MG/5ML syrup Take 5 mLs (10 mg total) by mouth 3 (three) times daily as needed for itching. 10/22/19  Yes Ghimire, Henreitta Leber, MD   melatonin 5 MG TABS Take 5 mg by mouth at bedtime.   Yes [provider]  Multiple Vitamins-Minerals (MULTIVITAMIN ADULTS PO) Take 1 tablet by mouth daily.   Yes [provider]  ferrous sulfate 325 (65 FE) MG EC tablet Take 325 mg by mouth daily with breakfast. Patient not taking: Reported on 10/28/2019    [provider]  norethindrone (MICRONOR,CAMILA,ERRIN) 0.35 MG tablet Take 1 tablet (0.35 mg total) by mouth daily. Patient not taking: Reported on 06/27/2015 03/30/14 03/30/19  Lahoma Crocker, MD  propranolol (INDERAL) 20 MG tablet Take 1 tablet (20 mg total) by mouth 2 (two) times daily. Patient not taking: Reported on 06/27/2015 08/12/13 03/30/19  Philemon Kingdom, MD     Family History  Problem Relation Age of Onset  . Diabetes Maternal Aunt   . Diabetes Maternal Uncle   . Diabetes Maternal Grandmother   . Diabetes Maternal Grandfather   . Stroke Paternal Grandfather   . Breast cancer Mother     Social History   Socioeconomic History  . Marital status: Single    Spouse name: Not on file  . Number of children: Not on file  . Years of education: Not on file  . Highest education level: Not on file  Occupational History  . Not on file  Tobacco Use  . Smoking status: Light Tobacco Smoker    Years: 14.00    Types: Cigarettes  . Smokeless tobacco: Never Used  Substance and Sexual Activity  . Alcohol use: Yes    Alcohol/week: 0.0 standard  drinks    Comment: on occasion  . Drug use: No    Types: Cocaine  . Sexual activity: Not Currently  Other Topics Concern  . Not on file  Social History Narrative   Regular exercise: walk   Caffeine use: 2 times a week   Social Determinants of Health   Financial Resource Strain:   . Difficulty of Paying Living Expenses:   Food Insecurity:   . Worried About Charity fundraiser in the Last Year:   . Arboriculturist in the Last Year:   Transportation Needs:   . Film/video editor (Medical):   Marland Kitchen Lack of  Transportation (Non-Medical):   Physical Activity:   . Days of Exercise per Week:   . Minutes of Exercise per Session:   Stress:   . Feeling of Stress :   Social Connections:   . Frequency of Communication with Friends and Family:   . Frequency of Social Gatherings with Friends and Family:   . Attends Religious Services:   . Active Member of Clubs or Organizations:   . Attends Archivist Meetings:   Marland Kitchen Marital Status:      Review of Systems: A 12 point ROS discussed and pertinent positives are indicated in the HPI above.  All other systems are negative.  Review of Systems  Constitutional: Negative for fatigue and fever.  Respiratory: Negative for cough and shortness of breath.   Cardiovascular: Negative for chest pain.  Gastrointestinal: Negative for abdominal pain, nausea and vomiting.  Genitourinary: Negative for dysuria.  Musculoskeletal: Negative for back pain.  Neurological: Negative for light-headedness and headaches.  Psychiatric/Behavioral: Negative for behavioral problems and confusion.    Vital Signs: BP 128/70 (BP Location: Left Arm)   Pulse 64   Temp 98 F (36.7 C) (Oral)   Resp 16   SpO2 98%   Physical Exam Vitals and nursing note reviewed.  Constitutional:      General: She is not in acute distress.    Appearance: Normal appearance. She is not ill-appearing.  Eyes:     General: Scleral icterus present.  Cardiovascular:     Rate and Rhythm: Normal rate and regular rhythm.  Pulmonary:     Effort: Pulmonary effort is normal. No respiratory distress.     Breath sounds: Normal breath sounds.  Abdominal:     General: Abdomen is flat.     Palpations: Abdomen is soft.  Skin:    General: Skin is warm and dry.  Neurological:     General: No focal deficit present.     Mental Status: She is alert and oriented to person, place, and time. Mental status is at baseline.  Psychiatric:        Mood and Affect: Mood normal.        Behavior: Behavior  normal.        Thought Content: Thought content normal.        Judgment: Judgment normal.      MD Evaluation Airway: WNL Heart: WNL Abdomen: WNL Chest/ Lungs: WNL ASA  Classification: 3 Mallampati/Airway Score: One   Imaging: US Abdomen Complete  Result Date: 10/19/2019 CLINICAL DATA:  Abnormal LFTs EXAM: ABDOMEN ULTRASOUND COMPLETE COMPARISON:  None. FINDINGS: Gallbladder: Gallbladder appears contracted with mild wall thickening, 4 mm. No visible stones or sonographic Murphy sign. Common bile duct: Diameter: Normal caliber, 3 mm Liver: No focal lesion identified. Within normal limits in parenchymal echogenicity. Portal vein is patent on color Doppler imaging with normal direction  of blood flow towards the liver. IVC: No abnormality visualized. Pancreas: Visualized portion unremarkable. Spleen: Size and appearance within normal limits. Right Kidney: Length: 12.5 cm. Echogenicity within normal limits. No mass or hydronephrosis visualized. Left Kidney: Length: 11.9 cm. Echogenicity within normal limits. No mass or hydronephrosis visualized. Abdominal aorta: No aneurysm visualized. Other findings: None. IMPRESSION: Contracted gallbladder with mild wall thickening. No visible gallstones. Electronically Signed   By: Rolm Baptise M.D.   On: 10/19/2019 15:53   CT Abdomen Pelvis W Contrast  Result Date: 10/28/2019 CLINICAL DATA:  Abdomen distension and diarrhea EXAM: CT ABDOMEN AND PELVIS WITH CONTRAST TECHNIQUE: Multidetector CT imaging of the abdomen and pelvis was performed using the standard protocol following bolus administration of intravenous contrast. CONTRAST:  157m OMNIPAQUE IOHEXOL 300 MG/ML  SOLN COMPARISON:  Ultrasound 10/19/2019, 03/15/2015 FINDINGS: Lower chest: No acute abnormality. Hepatobiliary: No focal liver abnormality is seen. No gallstones, gallbladder wall thickening, or biliary dilatation. Pancreas: Unremarkable. No pancreatic ductal dilatation or surrounding inflammatory  changes. Spleen: Normal in size without focal abnormality. Adrenals/Urinary Tract: Adrenal glands are unremarkable. Kidneys are normal, without renal calculi, focal lesion, or hydronephrosis. Bladder is unremarkable. Stomach/Bowel: Stomach is within normal limits. Appendix appears normal. No evidence of bowel wall thickening, distention, or inflammatory changes. Vascular/Lymphatic: No significant vascular findings are present. No enlarged abdominal or pelvic lymph nodes. Reproductive: Heterogenous posterior fundal uterine mass measuring 6.3 by 6.1 by 4.9 cm, consistent with fibroid. 4.6 x 4.6 cm right adnexal cyst. Other: Negative for free air or free fluid. Musculoskeletal: No acute or significant osseous findings. IMPRESSION: 1. No CT evidence for acute intra-abdominal or pelvic abnormality. 2. Heterogenous posterior fundal fibroid. 4.6 cm right adnexal cyst. Electronically Signed   By: KDonavan FoilM.D.   On: 10/28/2019 23:29   [place] SITE RITE  Result Date: 10/16/2019 If Site Rite image not attached, placement could not be confirmed due to current cardiac rhythm.  [place] Limited RUQ  Result Date: 10/11/2019 CLINICAL DATA:  Elevated liver function EXAM: ULTRASOUND ABDOMEN LIMITED RIGHT UPPER QUADRANT COMPARISON:  None. FINDINGS: Gallbladder: No gallstones or wall thickening visualized. No sonographic Murphy sign noted by sonographer. Common bile duct: Diameter: 4.2 mm Liver: No focal lesion identified. Within normal limits in parenchymal echogenicity. Portal vein is patent on color Doppler imaging with normal direction of blood flow towards the liver. Other: None. IMPRESSION: Negative right upper quadrant abdominal ultrasound Electronically Signed   By: KDonavan FoilM.D.   On: 10/11/2019 23:15    Labs:  CBC: Recent Labs    10/20/19 0500 10/22/19 1144 10/28/19 2022 10/29/19 0500  WBC 8.0 9.7 9.8 8.2  HGB 10.7* 11.6* 11.5* 10.0*  HCT 29.2* 32.0* 31.6* 27.2*  PLT 378 421* 437* 387     COAGS: Recent Labs    06/22/19 0402 10/12/19 1651 10/16/19 1940 10/21/19 1423 10/22/19 1144 10/28/19 2230  INR 1.0   < > 1.1 1.0 1.1 1.1  APTT 30  --   --   --   --  33   < > = values in this interval not displayed.    BMP: Recent Labs    10/19/19 0429 10/20/19 0500 10/28/19 2022 10/29/19 0500  NA 136 137 138 138  K 3.9 3.8 4.0 3.2*  CL 105 108 104 106  CO2 21* 22 23 24   GLUCOSE 87 88 107* 115*  BUN 8 8 10 9   CALCIUM 9.1 9.3 10.4* 9.7  CREATININE 0.73 0.51 <0.30* <0.30*  GFRNONAA >60 >60  NOT CALCULATED NOT CALCULATED  GFRAA >60 >60 NOT CALCULATED NOT CALCULATED    LIVER FUNCTION TESTS: Recent Labs    10/21/19 0916 10/22/19 1144 10/28/19 2022 10/29/19 0500  BILITOT 16.3* 18.7* 30.0* 25.5*  25.4*  AST 1,083* 956* 747* 632*  ALT 878* 779* 528* 453*  ALKPHOS 167* 159* 219* 179*  PROT 7.8 7.6 8.5* 7.0  ALBUMIN 2.9* 2.8* 3.2* 2.6*    TUMOR MARKERS: No results for input(s): AFPTM, CEA, CA199, CHROMGRNA in the last 8760 hours.  Assessment and Plan: Elevated liver enzymes  Patient returns to the ED with ongoing symptoms related to her liver dysfunction.  Tbili again elevated at 30.2.   IR consulted for liver biopsy.   PA met with patient at bedside to discuss biopsy.  Reviewed procedure, expectations, recovery, risks, and benefits.  She is alert and oriented.  She is able to describe her medical condition and demonstrate understanding of care plan.  Patient is hesitant to sign the consent form for the procedure. She asks if she can do the procedure without signing consent and asks if she should have a lawyer review it before signing.  She initially does not want to turn on the lights to read the consent stating "it hurts my eyes."  Explained that the purpose of the consent is to formalize the agreed upon plan between herself and the provider so that she understands exactly what we are doing and why, that we are doing what we said we will do, and that we are  doing what she wants [place] to do.  Reinforced that she has the ability to decide, but again reminded that the recommendation from the care team is to sample the liver for tissue analysis which will hopefully lead to answers. Mary Frey tells me she wants to do the biopsy and will move forward today.  I instructed her to remain NPO for sedation purposes. She would like to have her additional questions answered by the performing MD and plans to sign consent at that time.   Risks and benefits of biopsy was discussed with the patient and/or patient's family including, but not limited to bleeding, infection, damage to adjacent structures or low yield requiring additional tests.  Thank you for this interesting consult.  I greatly enjoyed meeting Mary Frey and look forward to participating in their care.  A copy of this report was sent to the requesting provider on this date.  Electronically Signed: Docia Barrier, PA 10/29/2019, 9:14 AM   I spent a total of 40 Minutes    in face to face in clinical consultation, greater than 50% of which was counseling/coordinating care for elevated LFTs, hyperbilirubinemia.

## 2019-10-29 NOTE — Progress Notes (Signed)
PROGRESS NOTE    Mary Frey  KWI:[id number] DOB: July 22, 1975 DOA: 10/28/2019 PCP: Patient, No Pcp Per   Chef Complaints: Fatigue, itching with 1 month diarrhea  Brief Narrative: 44 year old female with liver disease anemia chronic back pain fibroids, Graves' disease, hypertension history of substance abuse systolic heart failure tobacco abuse presented with not feeling good since her discharge on 2 July when she left without completing her work-up for liver dysfunction, refused liver biopsy.  As per review of records patient was trying to obtain her lab results from her new PCP office they wanted to see her in the office first but patient had difficulties getting them and stated that she did not want to be seen in the past.  They advised patient if she is not feeling well to present back to emergency department which she did Patient states she has been having diarrhea for the past 2 months no abdominal pain no nausea no vomiting no fever Patient recently was diagnosed with possible hepatitis of unclear etiology felt to be drug-induced.  Work-up was inconclusive.  She is supposed to undergo liver biopsy tomorrow she has not had any fevers or nausea no black stools no melena but she does endorse diarrhea.   Denies EtOh abuse,, states she quit smoking a long time ago, denies using acetaminophen,  States her liver failure came from toxins in the air or at her job when she worked  History significant for patient developing Graves' disease she stopped taking her methimazole in December 2020 and then had hyperthyroidism and was restarted on her methimazole she came to the emergency department originally with abdominal pain and diarrhea and was found to have hepatitis initial thought to be secondary to methimazole toxicity she continued to have elevated LFTs [facility] consulted and recommended liver biopsy patient refused and left AMA on the second for July plan was for her to have biopsy done as an  outpatient.  During her prior hospitalization 21st and 29 June she had unremarkable right upper quadrant ultrasound. Work-up so far included .Acute hepatitis serology negative. AMA, ASMA negative. IgM EBV and IgM CMV negative. IgM HSV 1 +ve initially ID felt that HSV hepatitis was a possibility GI recommended liver biopsy to confirm as well as HSV PCR and HCV PCR On 1 July patient was seen by interventional radiology but refused to sign a consent. Patient hospital stay was complicated by the patient refusing labs CT scan of the abdomen no HIV testing.  She was found to have positive ANA and SSA antibody but SSA titer mildly elevated and double-stranded DNA was negative she was supposed to follow-up as an outpatient rheumatology  Patient was admitted with intention for liver biopsy and further GI consultation/work-up.  Subjective: Complaining of diarrhea no nausea vomiting or abdominal pain.  Complains of itching. Putting shades in the room Reports she has spoken to the GI doctor and he feels ready for liver biopsy today-as advised previously and this time to find answers of her liver problem    Assessment & Plan:  Abnormal LFTs, significantly elevated bilirubin, unclear etiology: There was concern this could be related to recent methimazole use-has been discontinued but total bili continues to rise.  Will bili slightly improving today.  Patient had extensive evaluation with serology blood work GI eval and biopsy of the liver was recommended eventually which she declined and finally left the hospital.  Was supposed to see GI as outpatient.  Patient was agreed for biopsy and going for IR  guided biopsy today.  GI following closely, continue supportive measures, Atarax for itching, avoid hepatotoxic medication.  PT/INR remains stable and reassuring, platelet counts are normal..  Diarrhea: Likely from her hypothyroidism/high bilirubin.  Checking C. difficile and GI panel.  Hyperthyroidism on  methimazole which is on hold given concern for toxicity.  TSH less than 0.01 T4 4.4.  Monitor labs. Await for GI input/-will need to d/w with GI about when to safely start methimazole given that her  Free T4 is increasing  Weight loss likely from Graves' disease.  Hypertension: BP controlled on atenolol  Tobacco use disorder in remission  Possible protein calorie malnutrition augment nutrition dietitian on consult  Chronic microcytic anemia: Fairly stable B12, iron level and folate no signs of acute bleeding  DVT prophylaxis: SCDs Start: 10/29/19 0207 Code Status: full code Family Communication: plan of care discussed with patient at bedside.  Status is: admitted as Observation Patient remains hospitalized for ongoing management of her significant liver dysfunction along with diarrhea, will need to stay in the hospital at least 2 midnight, change to inpatient  Dispo: The patient is from: Home              Anticipated d/c is to: Home              Anticipated d/c date is: 2 days              Patient currently is not medically stable to d/c.  Nutrition: Diet Order            Diet NPO time specified  Diet effective now                   There is no height or weight on file to calculate BMI.  Consultants:see note  Procedures:see note Microbiology:see note  Medications: Scheduled Meds: . atenolol  12.5 mg Oral BID  . hydrOXYzine  10 mg Oral Once  . sodium chloride flush  3 mL Intravenous Q12H   Continuous Infusions: . sodium chloride 75 mL/hr at 10/29/19 0241    Antimicrobials: Anti-infectives (From admission, onward)   None       Objective: Vitals: Today's Vitals   10/29/19 0507 10/29/19 0729 10/29/19 0833 10/29/19 1035  BP: (!) 142/85  128/70 136/87  Pulse: 72  64 75  Resp: 20  16 16   Temp:      TempSrc:      SpO2: 100%  98% 96%  PainSc: 0-No pain 0-No pain      Intake/Output Summary (Last 24 hours) at 10/29/2019 1046 Last data filed at 10/29/2019  0246 Gross per 24 hour  Intake 397.92 ml  Output --  Net 397.92 ml   There were no vitals filed for this visit. Weight change:    Intake/Output from previous day: 07/08 0701 - 07/09 0700 In: 397.9 [I.V.:397.9] Out: -  Intake/Output this shift: No intake/output data recorded.  Examination:  General exam: AAOx3, thin cachectic frail, on room air HEENT:Oral mucosa moist, Ear/Nose WNL grossly,dentition normal.  Scleral icterus present. Respiratory system: bilaterally clear,no wheezing or crackles,no use of accessory muscle, non tender. Cardiovascular system: S1 & S2 +, regular, No JVD. Gastrointestinal system: Abdomen soft, NT,ND, BS+. Nervous System:Alert, awake, moving extremities and grossly nonfocal Extremities: No edema, distal peripheral pulses palpable.  Skin: No rashes,no icterus. MSK: Normal muscle bulk,tone, power  Data Reviewed: I have personally reviewed following labs and imaging studies CBC: Recent Labs  Lab 10/22/19 1144 10/28/19 2022 10/29/19 0500  WBC 9.7  9.8 8.2  NEUTROABS 6.8  --  5.4  HGB 11.6* 11.5* 10.0*  HCT 32.0* 31.6* 27.2*  MCV 69.4* 71.7* 70.6*  PLT 421* 437* 202   Basic Metabolic Panel: Recent Labs  Lab 10/28/19 2022 10/29/19 0500  NA 138 138  K 4.0 3.2*  CL 104 106  CO2 23 24  GLUCOSE 107* 115*  BUN 10 9  CREATININE <0.30* <0.30*  CALCIUM 10.4* 9.7  MG  --  1.7  PHOS  --  4.0   GFR: CrCl cannot be calculated (This lab value cannot be used to calculate CrCl because it is not a number: <0.30). Liver Function Tests: Recent Labs  Lab 10/22/19 1144 10/28/19 2022 10/29/19 0500  AST 956* 747* 632*  ALT 779* 528* 453*  ALKPHOS 159* 219* 179*  BILITOT 18.7* 30.0* 25.5*  25.4*  PROT 7.6 8.5* 7.0  ALBUMIN 2.8* 3.2* 2.6*   Recent Labs  Lab 10/28/19 2022  LIPASE 34   Recent Labs  Lab 10/29/19 0025  AMMONIA 34   Coagulation Profile: Recent Labs  Lab 10/22/19 1144 10/28/19 2230  INR 1.1 1.1   Cardiac Enzymes: Recent  Labs  Lab 10/29/19 0500  CKTOTAL 28*   BNP (last 3 results) No results for input(s): PROBNP in the last 8760 hours. HbA1C: No results for input(s): HGBA1C in the last 72 hours. CBG: No results for input(s): GLUCAP in the last 168 hours. Lipid Profile: Recent Labs    10/29/19 0500  CHOL 147  HDL <10*  LDLCALC NOT CALCULATED  TRIG 327*  CHOLHDL NOT CALCULATED   Thyroid Function Tests: Recent Labs    10/29/19 0022  TSH <0.010*  FREET4 4.46*   Anemia Panel: Recent Labs    10/29/19 0500  VITAMINB12 1,116*  FOLATE 16.6  FERRITIN 620*  TIBC 241*  IRON 207*  RETICCTPCT 2.7   Sepsis Labs: No results for input(s): PROCALCITON, LATICACIDVEN in the last 168 hours.  Recent Results (from the past 240 hour(s))  SARS Coronavirus 2 by RT PCR (hospital order, performed in [facility] hospital lab) Nasopharyngeal Nasopharyngeal Swab     Status: None   Collection Time: 10/29/19 12:07 AM   Specimen: Nasopharyngeal Swab  Result Value Ref Range Status   SARS Coronavirus 2 NEGATIVE NEGATIVE Final    Comment: (NOTE) SARS-CoV-2 target nucleic acids are NOT DETECTED.  The SARS-CoV-2 RNA is generally detectable in upper and lower respiratory specimens during the acute phase of infection. The lowest concentration of SARS-CoV-2 viral copies this assay can detect is 250 copies / mL. A negative result does not preclude SARS-CoV-2 infection and should not be used as the sole basis for treatment or other patient management decisions.  A negative result may occur with improper specimen collection / handling, submission of specimen other than nasopharyngeal swab, presence of viral mutation(s) within the areas targeted by this assay, and inadequate number of viral copies (<250 copies / mL). A negative result must be combined with clinical observations, patient history, and epidemiological information.  Fact Sheet for Patients:   StrictlyIdeas.no  Fact Sheet for  Healthcare Providers: BankingDealers.co.za  This test is not yet approved or  cleared by the [place] FDA and has been authorized for detection and/or diagnosis of SARS-CoV-2 by FDA under an Emergency Use Authorization (EUA).  This EUA will remain in effect (meaning this test can be used) for the duration of the COVID-19 declaration under Section 564(b)(1) of the Act, 21 U.S.C. section 360bbb-3(b)(1), unless the authorization is terminated or  revoked sooner.  Performed at [facility], [place] [address] [place], [place] [address]       Radiology Studies: CT Abdomen Pelvis W Contrast  Result Date: 10/28/2019 CLINICAL DATA:  Abdomen distension and diarrhea EXAM: CT ABDOMEN AND PELVIS WITH CONTRAST TECHNIQUE: Multidetector CT imaging of the abdomen and pelvis was performed using the standard protocol following bolus administration of intravenous contrast. CONTRAST:  110mL OMNIPAQUE IOHEXOL 300 MG/ML  SOLN COMPARISON:  Ultrasound 10/19/2019, 03/15/2015 FINDINGS: Lower chest: No acute abnormality. Hepatobiliary: No focal liver abnormality is seen. No gallstones, gallbladder wall thickening, or biliary dilatation. Pancreas: Unremarkable. No pancreatic ductal dilatation or surrounding inflammatory changes. Spleen: Normal in size without focal abnormality. Adrenals/Urinary Tract: Adrenal glands are unremarkable. Kidneys are normal, without renal calculi, focal lesion, or hydronephrosis. Bladder is unremarkable. Stomach/Bowel: Stomach is within normal limits. Appendix appears normal. No evidence of bowel wall thickening, distention, or inflammatory changes. Vascular/Lymphatic: No significant vascular findings are present. No enlarged abdominal or pelvic lymph nodes. Reproductive: Heterogenous posterior fundal uterine mass measuring 6.3 by 6.1 by 4.9 cm, consistent with fibroid. 4.6 x 4.6 cm right adnexal cyst. Other: Negative for free air or free fluid.  Musculoskeletal: No acute or significant osseous findings. IMPRESSION: 1. No CT evidence for acute intra-abdominal or pelvic abnormality. 2. Heterogenous posterior fundal fibroid. 4.6 cm right adnexal cyst. Electronically Signed   By: Donavan Foil M.D.   On: 10/28/2019 23:29     LOS: 0 days   Antonieta Pert, MD [facility]  10/29/2019, 10:46 AM

## 2019-10-29 NOTE — ED Notes (Signed)
Secretary paged hospitalist for pt concerns

## 2019-10-29 NOTE — ED Notes (Signed)
Upon entering room pt is drinking juice and eating chips, RN reminded patient that MD has patient as NPO.

## 2019-10-29 NOTE — Consult Note (Signed)
Referring Provider: Dr. Toy Baker Primary Care Physician:  Patient, No Pcp Per Primary Gastroenterologist:  Althia Forts  Reason for Consultation:  Transaminitis/hyperbilirubinemia  HPI: Mary Frey is a 44 y.o. female with Graves' disease (not currently on medication) and anemia presenting with transaminitis.  Patient reports worsening fatigue, which is why she presented to the ED.  She has had weight loss over the last month despite a normal appetite.  She also reports itchiness.  She denies any abdominal pain.  She had some nausea with one episode of nonbloody, nonbilious emesis two days ago but denies any nausea or vomiting today.  She also reports diarrhea for 1 month but states it has been worsening and she has been having approximately 6 loose stools per day.  She denies any melena or hematochezia.  She denies any dysphagia, heartburn, and constipation.  Patient states she rarely drinks alcohol.  She last consumed alcohol about a month ago.  She takes naproxen as needed, mostly for menstrual cramping.  She states she takes approximately 2 doses per month.  She denies any Tylenol use.  Denies any anticoagulation use.  Denies any drug use.  She denies any family history of liver disease.  She has never had an EGD or colonoscopy.  She denies any family history of colon cancer or gastrointestinal malignancies.  She had an extensive work-up during prior hospitalization 10/11/19-10/22/19.  Liver biopsy was recommended at that time; however, patient left AMA.  Past Medical History:  Diagnosis Date  . Anemia   . Chronic back pain   . Fibroids   . Graves disease   . Hypertension   . Pregnancy induced hypertension     Past Surgical History:  Procedure Laterality Date  . NO PAST SURGERIES      Prior to Admission medications   Medication Sig Start Date End Date Taking? Authorizing Provider  atenolol (TENORMIN) 25 MG tablet Take 0.5 tablets (12.5 mg total) by mouth 2 (two) times  daily. 10/22/19  Yes Ghimire, Henreitta Leber, MD  FEROSUL 325 (65 Fe) MG tablet Take 325 mg by mouth daily. 10/25/19  Yes [provider]  hydrOXYzine (ATARAX) 10 MG/5ML syrup Take 5 mLs (10 mg total) by mouth 3 (three) times daily as needed for itching. 10/22/19  Yes Ghimire, Henreitta Leber, MD  melatonin 5 MG TABS Take 5 mg by mouth at bedtime.   Yes [provider]  Multiple Vitamins-Minerals (MULTIVITAMIN ADULTS PO) Take 1 tablet by mouth daily.   Yes [provider]  ferrous sulfate 325 (65 FE) MG EC tablet Take 325 mg by mouth daily with breakfast. Patient not taking: Reported on 10/28/2019    [provider]  norethindrone (MICRONOR,CAMILA,ERRIN) 0.35 MG tablet Take 1 tablet (0.35 mg total) by mouth daily. Patient not taking: Reported on 06/27/2015 03/30/14 03/30/19  Lahoma Crocker, MD  propranolol (INDERAL) 20 MG tablet Take 1 tablet (20 mg total) by mouth 2 (two) times daily. Patient not taking: Reported on 06/27/2015 08/12/13 03/30/19  Philemon Kingdom, MD    Scheduled Meds: . atenolol  12.5 mg Oral BID  . hydrOXYzine  10 mg Oral Once  . potassium chloride  40 mEq Oral Once  . sodium chloride flush  3 mL Intravenous Q12H   Continuous Infusions: . sodium chloride 75 mL/hr at 10/29/19 0241   PRN Meds:.ondansetron **OR** ondansetron (ZOFRAN) IV  Allergies as of 10/28/2019 - Review Complete 10/28/2019  Allergen Reaction Noted  . Hydrocodone Nausea And Vomiting 10/01/2011  . Methimazole Diarrhea 10/14/2019  .  Ptu [propylthiouracil] Other (See Comments) 10/14/2019  . Latex Itching and Rash 10/01/2011    Family History  Problem Relation Age of Onset  . Diabetes Maternal Aunt   . Diabetes Maternal Uncle   . Diabetes Maternal Grandmother   . Diabetes Maternal Grandfather   . Stroke Paternal Grandfather   . Breast cancer Mother     Social History   Socioeconomic History  . Marital status: Single    Spouse name: Not on file  . Number of children: Not on  file  . Years of education: Not on file  . Highest education level: Not on file  Occupational History  . Not on file  Tobacco Use  . Smoking status: Light Tobacco Smoker    Years: 14.00    Types: Cigarettes  . Smokeless tobacco: Never Used  Substance and Sexual Activity  . Alcohol use: Yes    Alcohol/week: 0.0 standard drinks    Comment: on occasion  . Drug use: No    Types: Cocaine  . Sexual activity: Not Currently  Other Topics Concern  . Not on file  Social History Narrative   Regular exercise: walk   Caffeine use: 2 times a week   Social Determinants of Health   Financial Resource Strain:   . Difficulty of Paying Living Expenses:   Food Insecurity:   . Worried About Charity fundraiser in the Last Year:   . Arboriculturist in the Last Year:   Transportation Needs:   . Film/video editor (Medical):   Marland Kitchen Lack of Transportation (Non-Medical):   Physical Activity:   . Days of Exercise per Week:   . Minutes of Exercise per Session:   Stress:   . Feeling of Stress :   Social Connections:   . Frequency of Communication with Friends and Family:   . Frequency of Social Gatherings with Friends and Family:   . Attends Religious Services:   . Active Member of Clubs or Organizations:   . Attends Archivist Meetings:   Marland Kitchen Marital Status:   Intimate Partner Violence:   . Fear of Current or Ex-Partner:   . Emotionally Abused:   Marland Kitchen Physically Abused:   . Sexually Abused:     Review of Systems: Review of Systems  Constitutional: Positive for malaise/fatigue and weight loss. Negative for chills and fever.  HENT: Negative for hearing loss and tinnitus.   Eyes: Negative for pain and redness.       +scleral icterus  Respiratory: Negative for cough and shortness of breath.   Cardiovascular: Negative for chest pain and palpitations.  Gastrointestinal: Positive for diarrhea, nausea and vomiting. Negative for abdominal pain, blood in stool, constipation, heartburn and  melena.  Genitourinary: Negative for flank pain and hematuria.  Musculoskeletal: Negative for falls and joint pain.  Skin: Positive for itching. Negative for rash.  Neurological: Negative for seizures and loss of consciousness.  Endo/Heme/Allergies: Negative for polydipsia. Does not bruise/bleed easily.  Psychiatric/Behavioral: Negative for substance abuse. The patient is not nervous/anxious.      Physical Exam: Vital signs: Vitals:   10/29/19 0507 10/29/19 0833  BP: (!) 142/85 128/70  Pulse: 72 64  Resp: 20 16  Temp:    SpO2: 100% 98%   Physical Exam Constitutional:      General: She is not in acute distress.    Appearance: She is underweight. She is ill-appearing.  HENT:     Head: Normocephalic and atraumatic.     Nose: Nose  normal.     Mouth/Throat:     Mouth: Mucous membranes are moist.     Pharynx: Oropharynx is clear.  Eyes:     General: Scleral icterus present.     Extraocular Movements: Extraocular movements intact.  Cardiovascular:     Rate and Rhythm: Normal rate and regular rhythm.     Pulses: Normal pulses.  Pulmonary:     Effort: Pulmonary effort is normal. No respiratory distress.  Abdominal:     General: Bowel sounds are normal. There is no distension.     Palpations: Abdomen is soft. There is no hepatomegaly or mass.     Tenderness: There is no abdominal tenderness. There is no guarding or rebound.  Musculoskeletal:        General: No swelling or tenderness.     Cervical back: Normal range of motion and neck supple.  Skin:    General: Skin is warm and dry.     Coloration: Skin is jaundiced.  Neurological:     General: No focal deficit present.     Mental Status: She is oriented to person, place, and time. She is lethargic.  Psychiatric:        Mood and Affect: Mood normal.        Behavior: Behavior normal. Behavior is cooperative.     GI:  Lab Results: Recent Labs    10/28/19 2022 10/29/19 0500  WBC 9.8 8.2  HGB 11.5* 10.0*  HCT 31.6*  27.2*  PLT 437* 387   BMET Recent Labs    10/28/19 2022 10/29/19 0500  NA 138 138  K 4.0 3.2*  CL 104 106  CO2 23 24  GLUCOSE 107* 115*  BUN 10 9  CREATININE <0.30* <0.30*  CALCIUM 10.4* 9.7   LFT Recent Labs    10/29/19 0500  PROT 7.0  ALBUMIN 2.6*  AST 632*  ALT 453*  ALKPHOS 179*  BILITOT 25.5*  25.4*  BILIDIR 14.3*  IBILI 11.2*   PT/INR Recent Labs    10/28/19 2230  LABPROT 13.4  INR 1.1     Studies/Results: CT Abdomen Pelvis W Contrast  Result Date: 10/28/2019 CLINICAL DATA:  Abdomen distension and diarrhea EXAM: CT ABDOMEN AND PELVIS WITH CONTRAST TECHNIQUE: Multidetector CT imaging of the abdomen and pelvis was performed using the standard protocol following bolus administration of intravenous contrast. CONTRAST:  120mL OMNIPAQUE IOHEXOL 300 MG/ML  SOLN COMPARISON:  Ultrasound 10/19/2019, 03/15/2015 FINDINGS: Lower chest: No acute abnormality. Hepatobiliary: No focal liver abnormality is seen. No gallstones, gallbladder wall thickening, or biliary dilatation. Pancreas: Unremarkable. No pancreatic ductal dilatation or surrounding inflammatory changes. Spleen: Normal in size without focal abnormality. Adrenals/Urinary Tract: Adrenal glands are unremarkable. Kidneys are normal, without renal calculi, focal lesion, or hydronephrosis. Bladder is unremarkable. Stomach/Bowel: Stomach is within normal limits. Appendix appears normal. No evidence of bowel wall thickening, distention, or inflammatory changes. Vascular/Lymphatic: No significant vascular findings are present. No enlarged abdominal or pelvic lymph nodes. Reproductive: Heterogenous posterior fundal uterine mass measuring 6.3 by 6.1 by 4.9 cm, consistent with fibroid. 4.6 x 4.6 cm right adnexal cyst. Other: Negative for free air or free fluid. Musculoskeletal: No acute or significant osseous findings. IMPRESSION: 1. No CT evidence for acute intra-abdominal or pelvic abnormality. 2. Heterogenous posterior fundal  fibroid. 4.6 cm right adnexal cyst. Electronically Signed   By: Donavan Foil M.D.   On: 10/28/2019 23:29   Assessment: Transaminitis:unclear etiology. Initially, there was concern this could be related to recent methimazole use; however, patient discontinued  methimazole and T. Bili continues to rise. -T. Bili 25.4 today: direct bili 14.3, indirect bili 11.2. T bili 30.0 yesterday (16.2 on 10/22/19) -AST/ALT/ALP remain elevated but have decreased since prior admission.  Today, AST 632/ ALT 453/ ALP 179 (AST 1083/ ALT 878/ ALP 167 as of 10/22/19) -PT/INR normal -Acute hepatitis panel negative 10/11/19; Hepatitis C PCR negative 10/21/19 -HSV IgM positive 10/12/19 but HSV PCR negative 10/21/19 -CMVand EBVIgM negative 10/12/19 -No deficiencies of alpha-1-antitrypsin and ceruloplasmin 10/12/19 -ASMAandAMA negative 10/15/19 -Ferritin elevated to 620, Iron elevated to 207 with saturation elevated to 86%; patient taking iron supplementation at home. -CT 10/28/19: No CT evidence for acute intra-abdominal or pelvic abnormality.  No focal liver abnormality is seen. No gallstones, gallbladder wall thickening, or biliary dilatation. -Right upper quadrant ultrasound was unremarkable 10/11/19  Graves' Disease: not currently on medication; TSH <0.010, T4 4.46  Diarrhea, possibly related to Graves' disease; however, patient has not had stool studies, thus stool studies were ordered to rule out infectious etiology  Weight loss, likely related to Graves' disease  Chronic microcytic anemia but no deficiency in iron, folate, or B12.  No signs of GI bleeding.  Plan: Recommend IR consultation for liver biopsy.  Liver biopsy discussed with patient and is in agreement to proceed.  Advised patient that liver may help [place] determine etiology of transaminitis, though there is no guarantee that liver biopsy will yield a diagnosis.   C. Diff and GI pathogen panel ordered to rule out infectious etiology of diarrhea,  pending.  Continue supportive care. Continue to monitor LFTs.  [facility] will follow.   LOS: 0 days   Salley Slaughter  PA-C 10/29/2019, 8:55 AM  Contact #  [phone]

## 2019-10-29 NOTE — ED Notes (Signed)
Secretary paged hospitalist, pt refusing complete lab draw, states she does not want IV fluids, does not want to have an IV in and will only allow RN to draw thyroid labs and ammonia. RN educated pt on labs ordered and reason for IV fluids as well as IV access while admitted.

## 2019-10-29 NOTE — ED Notes (Signed)
Pt requesting to speak to MD and requesting atarax, Dr Roel Cluck messaged regarding pt concerns

## 2019-10-29 NOTE — ED Notes (Signed)
Pt requesting lab results, RN educated patient that is it out of scope and the provider will address lab results during rounds, reminded pt to remain NPO

## 2019-10-30 DIAGNOSIS — I110 Hypertensive heart disease with heart failure: Secondary | ICD-10-CM | POA: Diagnosis present

## 2019-10-30 DIAGNOSIS — Z833 Family history of diabetes mellitus: Secondary | ICD-10-CM | POA: Diagnosis not present

## 2019-10-30 DIAGNOSIS — R945 Abnormal results of liver function studies: Secondary | ICD-10-CM | POA: Diagnosis present

## 2019-10-30 DIAGNOSIS — Z823 Family history of stroke: Secondary | ICD-10-CM | POA: Diagnosis not present

## 2019-10-30 DIAGNOSIS — K7290 Hepatic failure, unspecified without coma: Secondary | ICD-10-CM | POA: Diagnosis present

## 2019-10-30 DIAGNOSIS — F17210 Nicotine dependence, cigarettes, uncomplicated: Secondary | ICD-10-CM | POA: Diagnosis present

## 2019-10-30 DIAGNOSIS — D509 Iron deficiency anemia, unspecified: Secondary | ICD-10-CM | POA: Diagnosis present

## 2019-10-30 DIAGNOSIS — T382X5A Adverse effect of antithyroid drugs, initial encounter: Secondary | ICD-10-CM | POA: Diagnosis present

## 2019-10-30 DIAGNOSIS — Z803 Family history of malignant neoplasm of breast: Secondary | ICD-10-CM | POA: Diagnosis not present

## 2019-10-30 DIAGNOSIS — G8929 Other chronic pain: Secondary | ICD-10-CM | POA: Diagnosis present

## 2019-10-30 DIAGNOSIS — E0500 Thyrotoxicosis with diffuse goiter without thyrotoxic crisis or storm: Secondary | ICD-10-CM | POA: Diagnosis present

## 2019-10-30 DIAGNOSIS — Z20822 Contact with and (suspected) exposure to covid-19: Secondary | ICD-10-CM | POA: Diagnosis present

## 2019-10-30 DIAGNOSIS — M549 Dorsalgia, unspecified: Secondary | ICD-10-CM | POA: Diagnosis present

## 2019-10-30 DIAGNOSIS — R634 Abnormal weight loss: Secondary | ICD-10-CM | POA: Diagnosis present

## 2019-10-30 DIAGNOSIS — E43 Unspecified severe protein-calorie malnutrition: Secondary | ICD-10-CM | POA: Diagnosis present

## 2019-10-30 DIAGNOSIS — Z885 Allergy status to narcotic agent status: Secondary | ICD-10-CM | POA: Diagnosis not present

## 2019-10-30 DIAGNOSIS — R197 Diarrhea, unspecified: Secondary | ICD-10-CM | POA: Diagnosis present

## 2019-10-30 DIAGNOSIS — K759 Inflammatory liver disease, unspecified: Secondary | ICD-10-CM | POA: Diagnosis not present

## 2019-10-30 DIAGNOSIS — I5022 Chronic systolic (congestive) heart failure: Secondary | ICD-10-CM | POA: Diagnosis present

## 2019-10-30 DIAGNOSIS — K7200 Acute and subacute hepatic failure without coma: Secondary | ICD-10-CM | POA: Diagnosis not present

## 2019-10-30 DIAGNOSIS — Z681 Body mass index (BMI) 19 or less, adult: Secondary | ICD-10-CM | POA: Diagnosis not present

## 2019-10-30 DIAGNOSIS — L299 Pruritus, unspecified: Secondary | ICD-10-CM | POA: Diagnosis present

## 2019-10-30 DIAGNOSIS — Z888 Allergy status to other drugs, medicaments and biological substances status: Secondary | ICD-10-CM | POA: Diagnosis not present

## 2019-10-30 DIAGNOSIS — R17 Unspecified jaundice: Secondary | ICD-10-CM | POA: Diagnosis not present

## 2019-10-30 DIAGNOSIS — Z91040 Latex allergy status: Secondary | ICD-10-CM | POA: Diagnosis not present

## 2019-10-30 DIAGNOSIS — D259 Leiomyoma of uterus, unspecified: Secondary | ICD-10-CM | POA: Diagnosis present

## 2019-10-30 DIAGNOSIS — F1410 Cocaine abuse, uncomplicated: Secondary | ICD-10-CM | POA: Diagnosis present

## 2019-10-30 LAB — URINALYSIS, ROUTINE W REFLEX MICROSCOPIC
Glucose, UA: NEGATIVE mg/dL
Hgb urine dipstick: NEGATIVE
Ketones, ur: NEGATIVE mg/dL
Leukocytes,Ua: NEGATIVE
Nitrite: NEGATIVE
Protein, ur: 30 mg/dL — AB
Specific Gravity, Urine: 1.013 (ref 1.005–1.030)
pH: 6 (ref 5.0–8.0)

## 2019-10-30 LAB — GASTROINTESTINAL PANEL BY PCR, STOOL (REPLACES STOOL CULTURE)

## 2019-10-30 LAB — T3: T3, Total: 289 ng/dL — ABNORMAL HIGH (ref 71–180)

## 2019-10-30 LAB — HIV-1 RNA QUANT-NO REFLEX-BLD
HIV 1 RNA Quant: <20 copies/mL
LOG10 HIV-1 RNA: UNDETERMINED log10copy/mL

## 2019-10-30 LAB — CBC
HCT: 30.4 % — ABNORMAL LOW (ref 36.0–46.0)
Hemoglobin: 11.2 g/dL — ABNORMAL LOW (ref 12.0–15.0)
MCH: 26.4 pg (ref 26.0–34.0)
MCHC: 36.8 g/dL — ABNORMAL HIGH (ref 30.0–36.0)
MCV: 71.5 fL — ABNORMAL LOW (ref 80.0–100.0)
Platelets: 397 10*3/uL (ref 150–400)
RBC: 4.25 MIL/uL (ref 3.87–5.11)
RDW: 18.5 % — ABNORMAL HIGH (ref 11.5–15.5)
WBC: 8.7 10*3/uL (ref 4.0–10.5)
nRBC: 0 % (ref 0.0–0.2)

## 2019-10-30 LAB — PROTIME-INR
INR: 1.1 (ref 0.8–1.2)
Prothrombin Time: 13.8 seconds (ref 11.4–15.2)

## 2019-10-30 LAB — ETHANOL: Alcohol, Ethyl (B): <10 mg/dL (ref <10)

## 2019-10-30 MED ORDER — TRAMADOL HCL 50 MG PO TABS
50.0000 mg | ORAL_TABLET | Freq: Once | ORAL | Status: AC
  Administered 2019-10-30: 50 mg via ORAL
  Filled 2019-10-30: qty 1

## 2019-10-30 MED ORDER — HYDROXYZINE HCL 10 MG/5ML PO SYRP
10.0000 mg | ORAL_SOLUTION | Freq: Four times a day (QID) | ORAL | Status: DC | PRN
  Administered 2019-10-30 – 2019-11-03 (×9): 10 mg via ORAL
  Filled 2019-10-30 (×13): qty 5

## 2019-10-30 MED ORDER — ADULT MULTIVITAMIN W/MINERALS CH
ORAL_TABLET | Freq: Every day | ORAL | Status: DC
  Administered 2019-10-30 – 2019-11-03 (×5): 1 via ORAL
  Filled 2019-10-30 (×5): qty 1

## 2019-10-30 MED ORDER — IBUPROFEN 200 MG PO TABS
400.0000 mg | ORAL_TABLET | Freq: Once | ORAL | Status: AC
  Administered 2019-10-30: 400 mg via ORAL
  Filled 2019-10-30: qty 2

## 2019-10-30 MED ORDER — DIPHENOXYLATE-ATROPINE 2.5-0.025 MG/5ML PO LIQD: 5.0000 mL | Freq: Four times a day (QID) | ORAL | Status: DC | PRN

## 2019-10-30 MED ORDER — HYDROXYZINE HCL 10 MG/5ML PO SYRP
10.0000 mg | ORAL_SOLUTION | Freq: Three times a day (TID) | ORAL | Status: DC | PRN
  Filled 2019-10-30 (×2): qty 5

## 2019-10-30 MED ORDER — MELATONIN 5 MG PO TABS
5.0000 mg | ORAL_TABLET | Freq: Every day | ORAL | Status: DC
  Administered 2019-10-30 – 2019-11-01 (×3): 5 mg via ORAL
  Filled 2019-10-30 (×4): qty 1

## 2019-10-30 MED ORDER — FERROUS SULFATE 325 (65 FE) MG PO TABS
325.0000 mg | ORAL_TABLET | Freq: Every day | ORAL | Status: DC
  Administered 2019-10-30 – 2019-11-03 (×5): 325 mg via ORAL
  Filled 2019-10-30 (×5): qty 1

## 2019-10-30 MED ORDER — DIPHENHYDRAMINE HCL 12.5 MG/5ML PO ELIX
6.2500 mg | ORAL_SOLUTION | Freq: Four times a day (QID) | ORAL | Status: DC | PRN
  Administered 2019-10-30: 6.25 mg via ORAL
  Filled 2019-10-30: qty 5

## 2019-10-30 MED ORDER — DIPHENOXYLATE-ATROPINE 2.5-0.025 MG PO TABS
1.0000 | ORAL_TABLET | Freq: Four times a day (QID) | ORAL | Status: DC | PRN
  Administered 2019-10-30 – 2019-11-01 (×4): 1 via ORAL
  Filled 2019-10-30 (×4): qty 1

## 2019-10-30 MED ORDER — ENSURE ENLIVE PO LIQD
237.0000 mL | Freq: Two times a day (BID) | ORAL | Status: DC
  Administered 2019-10-30 – 2019-11-03 (×8): 237 mL via ORAL

## 2019-10-30 NOTE — Progress Notes (Signed)
Initial Nutrition Assessment  DOCUMENTATION CODES:   Not applicable  INTERVENTION:  Provide Ensure Enlive po BID, each supplement provides 350 kcal and 20 grams of protein  Encourage adequate PO intake.  NUTRITION DIAGNOSIS:   Increased nutrient needs related to chronic illness (HF) as evidenced by estimated needs.  GOAL:   Patient will meet greater than or equal to 90% of their needs  MONITOR:   PO intake, Supplement acceptance, Skin, Weight trends, Labs, I & O's  REASON FOR ASSESSMENT:   Consult Assessment of nutrition requirement/status  ASSESSMENT:   44 year old female with liver disease anemia chronic back pain fibroids, Graves' disease, hypertension history of substance abuse systolic heart failure tobacco abuse presents with fatigue, itching, and 1 month diarrhea. Pt with abnormal LFTs, significantly elevated bilirubin. Pt underwent liver biopsy yesterday for elevated liver enzymes.   RD working remotely.  Pt unavailable during attempted time of contact. RD unable to obtain pt recent nutrition history. Meal completion has been 75%. Per weight records, pt with a 12% weight loss over the past 4 months per weight records, which is significant for time frame. RD to order nutritional supplements to aid in caloric and protein needs.   Unable to complete Nutrition-Focused physical exam at this time.   Labs and medications reviewed. AST elevated at 738. ALT elevated at 491. Total bilirubin elevated at 30.1.  Diet Order:   Diet Order            Diet regular Room service appropriate? Yes; Fluid consistency: Thin  Diet effective now                 EDUCATION NEEDS:   Not appropriate for education at this time  Skin:  Skin Assessment: Reviewed RN Assessment  Last BM:  7/10  Height:   Ht Readings from Last 1 Encounters:  10/30/19 5\' 5"  (2.119 m)    Weight:   Wt Readings from Last 1 Encounters:  10/30/19 52 kg    Ideal Body Weight:  56.8 kg  BMI:  Body  mass index is 19.08 kg/m.  Estimated Nutritional Needs:   Kcal:  1700-1900  Protein:  85-95 grams  Fluid:  >/= 1.7 L/day    Mary Parker, MS, RD, LDN RD pager number/after hours weekend pager number on Amion.

## 2019-10-30 NOTE — Progress Notes (Signed)
Pt refusing labs. Pt stated that "They have been drawing from her peripheral IV and that is where they need to get the blood." Writer educated pt. Charge Nurse Notified.

## 2019-10-30 NOTE — Progress Notes (Signed)
Pt c/o continual itching after receiving PO Benadryl. Dr. Lupita Leash made aware. New orders received for PO Atarax. Pt also refusing to have IV replaced. States she will allow phlebotomist to draw AM labs but will not allow staff to place PIV. Dr. Lupita Leash made aware.

## 2019-10-30 NOTE — Plan of Care (Signed)
Problem: Education: °Goal: Knowledge of General Education information will improve °Description: Including pain rating scale, medication(s)/side effects and non-pharmacologic comfort measures °Outcome: Completed/Met °  °

## 2019-10-30 NOTE — Progress Notes (Signed)
PROGRESS NOTE    ARMIYAH CAPRON  RDE:[id number] DOB: April 02, 1976 DOA: 10/28/2019 PCP: Patient, No Pcp Per   Chef Complaints: Fatigue, itching with 1 month diarrhea  Brief Narrative: 44 year old female with liver disease anemia chronic back pain fibroids, Graves' disease, hypertension history of substance abuse systolic heart failure tobacco abuse presented with not feeling good since her discharge on 2 July when she left without completing her work-up for liver dysfunction, refused liver biopsy.  As per review of records patient was trying to obtain her lab results from her new PCP office they wanted to see her in the office first but patient had difficulties getting them and stated that she did not want to be seen in the past.  They advised patient if she is not feeling well to present back to emergency department which she did Patient states she has been having diarrhea for the past 2 months no abdominal pain no nausea no vomiting no fever Patient recently was diagnosed with possible hepatitis of unclear etiology felt to be drug-induced.  Work-up was inconclusive.  She is supposed to undergo liver biopsy tomorrow she has not had any fevers or nausea no black stools no melena but she does endorse diarrhea.   Denies EtOh abuse,, states she quit smoking a long time ago, denies using acetaminophen,  States her liver failure came from toxins in the air or at her job when she worked  History significant for patient developing Graves' disease she stopped taking her methimazole in December 2020 and then had hyperthyroidism and was restarted on her methimazole she came to the emergency department originally with abdominal pain and diarrhea and was found to have hepatitis initial thought to be secondary to methimazole toxicity she continued to have elevated LFTs [facility]  GI consulted and recommended liver biopsy patient refused and left AMA on the second for July plan was for her to have biopsy done as an  outpatient.  During her prior hospitalization 21st and 29 June she had unremarkable right upper quadrant ultrasound. Work-up so far included .Acute hepatitis serology negative. AMA, ASMA negative. IgM EBV and IgM CMV negative. IgM HSV 1 +ve initially ID felt that HSV hepatitis was a possibility GI recommended liver biopsy to confirm as well as HSV PCR and HCV PCR On 1 July patient was seen by interventional radiology but refused to sign a consent. Patient hospital stay was complicated by the patient refusing labs CT scan of the abdomen no HIV testing.  She was found to have positive ANA and SSA antibody but SSA titer mildly elevated and double-stranded DNA was negative she was supposed to follow-up as an outpatient rheumatology  Patient was admitted with intention for liver biopsy and further GI consultation/work-up.  Subjective:  Complaining of urine frequency, diarrhea. Refused blood work this morning but after my discussion and agreed. Complains of ongoing itching and says Atarax gives her diarrhea and tried Benadryl which did not help and now wants to go back on Atarax.     Assessment & Plan:  Abnormal LFTs, significantly elevated bilirubin, unclear etiology: There was concern this could be related to recent methimazole use-has been discontinued but total bili continues to rise.Patient had extensive evaluation with serology blood work GI eval and biopsy of the liver was recommended eventually which she declined and finally left the hospital.  Was supposed to see GI as outpatient.  Status post IR biopsy 7/9,   LFTs pending today continue to follow GI recommendation continue supportive care, Atarax for  itching, avoid high potassium medication monitor PT/INR and LFTs.  INR has been stable.  Plan is to monitor LFTs tomorrow and if remains stable discharge if okay with GI  Diarrhea: Likely from her hyperthyroidism/high bilirubin.  Neg C. difficile and GI panel.  Added Lomotil patient  attributes diarrhea also from her Atarax  Hyperthyroidism on methimazole which is on hold given concern for toxicity.  TSH less than 0.01 T4 4.4.  Will need to see if we can put her back on methimazole once LFTs improve/if okay with GI patient is reluctant to try methimazole since she states everything started to get worse after methimazole.  She will need endocrinology follow-up  Weight loss likely from Graves' disease.  Hypertension: Blood pressure is controlled on atenolol.  Tobacco use disorder in remission  Possible protein calorie malnutrition augment nutrition dietitian on consult  Chronic microcytic anemia: Fairly stable B12, iron level and folate no signs of acute bleeding we will continue her iron supplementation.  DVT prophylaxis: SCDs Start: 10/29/19 0207 Code Status: full code Family Communication: plan of care discussed with patient at bedside.  Status is: Inpatient, Patient remains hospitalized for ongoing monitoring of her liver function test, further work-up,  Dispo: The patient is from: Home              Anticipated d/c is to: Home              Anticipated d/c date is: 1 day              Patient currently is not medically stable to d/c.  Anticipate discharge tomorrow if LFTs remain stable and okay with GI  Nutrition: Diet Order            Diet regular Room service appropriate? Yes; Fluid consistency: Thin  Diet effective now                   Body mass index is 19.08 kg/m.  Consultants:see note  Procedures:see note Microbiology:see note  Medications: Scheduled Meds: . atenolol  12.5 mg Oral BID  . ferrous sulfate  325 mg Oral Daily  . melatonin  5 mg Oral QHS  . multivitamin with minerals   Oral Daily  . sodium chloride flush  3 mL Intravenous Q12H   Continuous Infusions: . sodium chloride Stopped (10/30/19 0700)    Antimicrobials: Anti-infectives (From admission, onward)   None       Objective: Vitals: Today's Vitals   10/30/19 0424  10/30/19 0658 10/30/19 0938 10/30/19 1010  BP: (!) 148/74  (!) 142/88   Pulse: 80  88   Resp: 20     Temp: 98.2 F (36.8 C)     TempSrc:      SpO2: 100%     Weight:  52 kg    Height:  5\' 5"  (1.651 m)    PainSc:    Asleep    Intake/Output Summary (Last 24 hours) at 10/30/2019 1254 Last data filed at 10/30/2019 1000 Gross per 24 hour  Intake 643.46 ml  Output --  Net 643.46 ml   Filed Weights   10/30/19 0658  Weight: 52 kg   Weight change:    Intake/Output from previous day: 07/09 0701 - 07/10 0700 In: 570 [I.V.:570] Out: -  Intake/Output this shift: Total I/O In: 643.5 [P.O.:120; I.V.:523.5] Out: -   Examination:  General exam: AAO , NAD, weak appearing.  Thin and frail. HEENT:Oral mucosa moist, Ear/Nose WNL grossly, dentition normal. Respiratory system: bilaterally near,no wheezing  or crackles,no use of accessory muscle Cardiovascular system: S1 & S2 +, No JVD,. Gastrointestinal system: Abdomen soft, NT,ND, BS+ Nervous System:Alert, awake, moving extremities and grossly nonfocal Extremities: No edema, distal peripheral pulses palpable.  Skin: No rashes,no icterus. MSK: Normal muscle bulk,tone, power  Data Reviewed: I have personally reviewed following labs and imaging studies CBC: Recent Labs  Lab 10/28/19 2022 10/29/19 0500 10/30/19 1201  WBC 9.8 8.2 8.7  NEUTROABS  --  5.4  --   HGB 11.5* 10.0* 11.2*  HCT 31.6* 27.2* 30.4*  MCV 71.7* 70.6* 71.5*  PLT 437* 387 696   Basic Metabolic Panel: Recent Labs  Lab 10/28/19 2022 10/29/19 0500 10/30/19 1201  NA 138 138 137  K 4.0 3.2* 3.8  CL 104 106 102  CO2 23 24 25   GLUCOSE 107* 115* 90  BUN 10 9 12   CREATININE <0.30* <0.30* <0.30*  CALCIUM 10.4* 9.7 10.0  MG  --  1.7  --   PHOS  --  4.0  --    GFR: CrCl cannot be calculated (This lab value cannot be used to calculate CrCl because it is not a number: <0.30). Liver Function Tests: Recent Labs  Lab 10/28/19 2022 10/29/19 0500 10/30/19 1201   AST 747* 632* 738*  ALT 528* 453* 491*  ALKPHOS 219* 179* 197*  BILITOT 30.0* 25.5*  25.4* 30.1*  PROT 8.5* 7.0 8.2*  ALBUMIN 3.2* 2.6* 2.9*   Recent Labs  Lab 10/28/19 2022  LIPASE 34   Recent Labs  Lab 10/29/19 0025  AMMONIA 34   Coagulation Profile: Recent Labs  Lab 10/28/19 2230 10/30/19 1201  INR 1.1 1.1   Cardiac Enzymes: Recent Labs  Lab 10/29/19 0500  CKTOTAL 28*   BNP (last 3 results) No results for input(s): PROBNP in the last 8760 hours. HbA1C: No results for input(s): HGBA1C in the last 72 hours. CBG: No results for input(s): GLUCAP in the last 168 hours. Lipid Profile: Recent Labs    10/29/19 0500  CHOL 147  HDL <10*  LDLCALC NOT CALCULATED  TRIG 327*  CHOLHDL NOT CALCULATED   Thyroid Function Tests: Recent Labs    10/29/19 0022  TSH <0.010*  FREET4 4.46*   Anemia Panel: Recent Labs    10/29/19 0500  VITAMINB12 1,116*  FOLATE 16.6  FERRITIN 620*  TIBC 241*  IRON 207*  RETICCTPCT 2.7   Sepsis Labs: No results for input(s): PROCALCITON, LATICACIDVEN in the last 168 hours.  Recent Results (from the past 240 hour(s))  SARS Coronavirus 2 by RT PCR (hospital order, performed in [facility] hospital lab) Nasopharyngeal Nasopharyngeal Swab     Status: None   Collection Time: 10/29/19 12:07 AM   Specimen: Nasopharyngeal Swab  Result Value Ref Range Status   SARS Coronavirus 2 NEGATIVE NEGATIVE Final    Comment: (NOTE) SARS-CoV-2 target nucleic acids are NOT DETECTED.  The SARS-CoV-2 RNA is generally detectable in upper and lower respiratory specimens during the acute phase of infection. The lowest concentration of SARS-CoV-2 viral copies this assay can detect is 250 copies / mL. A negative result does not preclude SARS-CoV-2 infection and should not be used as the sole basis for treatment or other patient management decisions.  A negative result may occur with improper specimen collection / handling, submission of specimen  other than nasopharyngeal swab, presence of viral mutation(s) within the areas targeted by this assay, and inadequate number of viral copies (<250 copies / mL). A negative result must be combined with clinical observations,  patient history, and epidemiological information.  Fact Sheet for Patients:   StrictlyIdeas.no  Fact Sheet for Healthcare Providers: BankingDealers.co.za  This test is not yet approved or  cleared by the [place] FDA and has been authorized for detection and/or diagnosis of SARS-CoV-2 by FDA under an Emergency Use Authorization (EUA).  This EUA will remain in effect (meaning this test can be used) for the duration of the COVID-19 declaration under Section 564(b)(1) of the Act, 21 U.S.C. section 360bbb-3(b)(1), unless the authorization is terminated or revoked sooner.  Performed at [facility], [place] [address] [place], [place] [address]   Gastrointestinal Panel by PCR , Stool     Status: None   Collection Time: 10/29/19  9:16 AM   Specimen: Stool  Result Value Ref Range Status   Campylobacter species NOT DETECTED NOT DETECTED Final   Plesimonas shigelloides NOT DETECTED NOT DETECTED Final   Salmonella species NOT DETECTED NOT DETECTED Final   Yersinia enterocolitica NOT DETECTED NOT DETECTED Final   Vibrio species NOT DETECTED NOT DETECTED Final   Vibrio cholerae NOT DETECTED NOT DETECTED Final   Enteroaggregative E coli (EAEC) NOT DETECTED NOT DETECTED Final   Enteropathogenic E coli (EPEC) NOT DETECTED NOT DETECTED Final   Enterotoxigenic E coli (ETEC) NOT DETECTED NOT DETECTED Final   Shiga like toxin producing E coli (STEC) NOT DETECTED NOT DETECTED Final   Shigella/Enteroinvasive E coli (EIEC) NOT DETECTED NOT DETECTED Final   Cryptosporidium NOT DETECTED NOT DETECTED Final   Cyclospora cayetanensis NOT DETECTED NOT DETECTED Final   Entamoeba histolytica NOT DETECTED NOT DETECTED  Final   Giardia lamblia NOT DETECTED NOT DETECTED Final   Adenovirus F40/41 NOT DETECTED NOT DETECTED Final   Astrovirus NOT DETECTED NOT DETECTED Final   Norovirus GI/GII NOT DETECTED NOT DETECTED Final   Rotavirus A NOT DETECTED NOT DETECTED Final   Sapovirus (I, II, IV, and V) NOT DETECTED NOT DETECTED Final    Comment: Performed at [facility], [place] [place], [place] [address]  C Difficile Quick Screen w PCR reflex     Status: None   Collection Time: 10/29/19  9:16 AM   Specimen: Stool  Result Value Ref Range Status   C Diff antigen NEGATIVE NEGATIVE Final   C Diff toxin NEGATIVE NEGATIVE Final   C Diff interpretation No C. difficile detected.  Final    Comment: Performed at [facility], [place] [address] [place], [place] [address]      Radiology Studies: CT Abdomen Pelvis W Contrast  Result Date: 10/28/2019 CLINICAL DATA:  Abdomen distension and diarrhea EXAM: CT ABDOMEN AND PELVIS WITH CONTRAST TECHNIQUE: Multidetector CT imaging of the abdomen and pelvis was performed using the standard protocol following bolus administration of intravenous contrast. CONTRAST:  17mL OMNIPAQUE IOHEXOL 300 MG/ML  SOLN COMPARISON:  Ultrasound 10/19/2019, 03/15/2015 FINDINGS: Lower chest: No acute abnormality. Hepatobiliary: No focal liver abnormality is seen. No gallstones, gallbladder wall thickening, or biliary dilatation. Pancreas: Unremarkable. No pancreatic ductal dilatation or surrounding inflammatory changes. Spleen: Normal in size without focal abnormality. Adrenals/Urinary Tract: Adrenal glands are unremarkable. Kidneys are normal, without renal calculi, focal lesion, or hydronephrosis. Bladder is unremarkable. Stomach/Bowel: Stomach is within normal limits. Appendix appears normal. No evidence of bowel wall thickening, distention, or inflammatory changes. Vascular/Lymphatic: No significant vascular findings are present. No enlarged abdominal or pelvic lymph  nodes. Reproductive: Heterogenous posterior fundal uterine mass measuring 6.3 by 6.1 by 4.9 cm, consistent with fibroid. 4.6 x 4.6  cm right adnexal cyst. Other: Negative for free air or free fluid. Musculoskeletal: No acute or significant osseous findings. IMPRESSION: 1. No CT evidence for acute intra-abdominal or pelvic abnormality. 2. Heterogenous posterior fundal fibroid. 4.6 cm right adnexal cyst. Electronically Signed   By: Donavan Foil M.D.   On: 10/28/2019 23:29   IR US Guide Bx Asp/Drain  Result Date: 10/29/2019 INDICATION: 44 year old with jaundice and elevated liver enzymes. Request for liver biopsy. EXAM: ULTRASOUND-GUIDED RANDOM LIVER BIOPSY MEDICATIONS: None. ANESTHESIA/SEDATION: Moderate (conscious) sedation was employed during this procedure. A total of Versed 2.0 mg and Fentanyl 100 mcg was administered intravenously. Moderate Sedation Time: 10 minutes. The patient's level of consciousness and vital signs were monitored continuously by radiology nursing throughout the procedure under my direct supervision. FLUOROSCOPY TIME:  None COMPLICATIONS: None immediate. PROCEDURE: Informed written consent was obtained from the patient after a thorough discussion of the procedural risks, benefits and alternatives. All questions were addressed. Maximal Sterile Barrier Technique was utilized including caps, mask, sterile gowns, sterile gloves, sterile drape, hand hygiene and skin antiseptic. A timeout was performed prior to the initiation of the procedure. Liver was evaluated with ultrasound. Right hepatic lobe was targeted for biopsy. Right side of the abdomen was prepped with chlorhexidine and sterile field was created. Skin was anesthetized with 1% lidocaine. Small incision was made. Using ultrasound guidance, 17 gauge coaxial needle was directed in the right hepatic lobe. Total of 3 core biopsies were obtained with an 18 gauge core device. Specimens placed in formalin. Bandage placed over the puncture  site. FINDINGS: Three adequate cores obtained from the right hepatic lobe. No immediate bleeding or hematoma formation after the core biopsies. IMPRESSION: Successful ultrasound-guided random liver biopsy. Electronically Signed   By: Markus Daft M.D.   On: 10/29/2019 18:00     LOS: 1 day   Antonieta Pert, MD [facility]  10/30/2019, 12:54 PM

## 2019-10-30 NOTE — Progress Notes (Signed)
[facility] Progress Note  Mary Frey 44 y.o. 1975/06/05   Subjective: Complaining of constant itching. Reports that Atarax at home caused diarrhea and on Benadryl now. Denies abdominal pain, N/V. S/P liver biopsy yesterday. Nurse present during my evaluation. Refused labs this morning. Requesting regular diet because does not like low sodium diet.  Objective: Vital signs: Vitals:   10/30/19 0424 10/30/19 0938  BP: (!) 148/74 (!) 142/88  Pulse: 80 88  Resp: 20   Temp: 98.2 F (36.8 C)   SpO2: 100%     Physical Exam: Gen: lethargic, no acute distress  HEENT: +icteric sclera CV: RRR Chest: CTA B Abd: soft, nontender, nondistended, +BS Ext: no edema  Lab Results: Recent Labs    10/28/19 2022 10/29/19 0500  NA 138 138  K 4.0 3.2*  CL 104 106  CO2 23 24  GLUCOSE 107* 115*  BUN 10 9  CREATININE <0.30* <0.30*  CALCIUM 10.4* 9.7  MG  --  1.7  PHOS  --  4.0   Recent Labs    10/28/19 2022 10/29/19 0500  AST 747* 632*  ALT 528* 453*  ALKPHOS 219* 179*  BILITOT 30.0* 25.5*  25.4*  PROT 8.5* 7.0  ALBUMIN 3.2* 2.6*   Recent Labs    10/28/19 2022 10/29/19 0500  WBC 9.8 8.2  NEUTROABS  --  5.4  HGB 11.5* 10.0*  HCT 31.6* 27.2*  MCV 71.7* 70.6*  PLT 437* 387      Assessment/Plan: Jaundice from unclear etiology. S/P liver biopsy yesterday. F/U on results as outpt. Continue Benadryl for itching. GI pathogen panel negative. Anti-diarrheals prn. Patient willing to get labs. Changed to regular diet. Supportive care. Consider d/c tomorrow if stable and f/u with Dr. Alessandra Frey as outpt to review liver biopsy results. Will sign off. Call if questions.   Mary Frey 10/30/2019, 10:44 AM  Questions please call [phone] ID: Mary Frey, female   DOB: 03/16/1976, 44 y.o.   MRN: [id number]

## 2019-10-30 NOTE — Progress Notes (Signed)
Dr. Lupita Leash made aware that patient is refusing lab draws and IVF this AM.

## 2019-10-31 DIAGNOSIS — R17 Unspecified jaundice: Secondary | ICD-10-CM

## 2019-10-31 LAB — URINE CULTURE: Culture: NO GROWTH

## 2019-10-31 LAB — COMPREHENSIVE METABOLIC PANEL
ALT: 491 U/L — ABNORMAL HIGH (ref 0–44)
ALT: 390 U/L — ABNORMAL HIGH (ref 0–44)
AST: 738 U/L — ABNORMAL HIGH (ref 15–41)
AST: 658 U/L — ABNORMAL HIGH (ref 15–41)
Albumin: 2.9 g/dL — ABNORMAL LOW (ref 3.5–5.0)
Albumin: 2.7 g/dL — ABNORMAL LOW (ref 3.5–5.0)
Alkaline Phosphatase: 197 U/L — ABNORMAL HIGH (ref 38–126)
Alkaline Phosphatase: 182 U/L — ABNORMAL HIGH (ref 38–126)
Anion gap: 10 (ref 5–15)
Anion gap: 12 (ref 5–15)
BUN: 12 mg/dL (ref 6–20)
BUN: 16 mg/dL (ref 6–20)
CO2: 25 mmol/L (ref 22–32)
CO2: 27 mmol/L (ref 22–32)
Calcium: 10 mg/dL (ref 8.9–10.3)
Calcium: 10 mg/dL (ref 8.9–10.3)
Chloride: 102 mmol/L (ref 98–111)
Chloride: 101 mmol/L (ref 98–111)
Creatinine, Ser: <0.3 mg/dL — ABNORMAL LOW (ref 0.44–1.00)
Creatinine, Ser: <0.3 mg/dL — ABNORMAL LOW (ref 0.44–1.00)
Glucose, Bld: 90 mg/dL (ref 70–99)
Glucose, Bld: 90 mg/dL (ref 70–99)
Potassium: 3.8 mmol/L (ref 3.5–5.1)
Potassium: 4.2 mmol/L (ref 3.5–5.1)
Sodium: 137 mmol/L (ref 135–145)
Sodium: 140 mmol/L (ref 135–145)
Total Bilirubin: 30.1 mg/dL (ref 0.3–1.2)
Total Bilirubin: 26 mg/dL (ref 0.3–1.2)
Total Protein: 8.2 g/dL — ABNORMAL HIGH (ref 6.5–8.1)
Total Protein: 7.7 g/dL (ref 6.5–8.1)

## 2019-10-31 LAB — CBC
HCT: 28.5 % — ABNORMAL LOW (ref 36.0–46.0)
Hemoglobin: 10.5 g/dL — ABNORMAL LOW (ref 12.0–15.0)
MCH: 26.3 pg (ref 26.0–34.0)
MCHC: 36.8 g/dL — ABNORMAL HIGH (ref 30.0–36.0)
MCV: 71.3 fL — ABNORMAL LOW (ref 80.0–100.0)
Platelets: 362 10*3/uL (ref 150–400)
RBC: 4 MIL/uL (ref 3.87–5.11)
RDW: 18.3 % — ABNORMAL HIGH (ref 11.5–15.5)
WBC: 7.6 10*3/uL (ref 4.0–10.5)
nRBC: 0 % (ref 0.0–0.2)

## 2019-10-31 LAB — PROTIME-INR
INR: 1.2 (ref 0.8–1.2)
Prothrombin Time: 14.3 seconds (ref 11.4–15.2)

## 2019-10-31 MED ORDER — IBUPROFEN 200 MG PO TABS
400.0000 mg | ORAL_TABLET | Freq: Four times a day (QID) | ORAL | Status: DC | PRN
  Administered 2019-10-31 – 2019-11-01 (×2): 400 mg via ORAL
  Filled 2019-10-31 (×2): qty 2

## 2019-10-31 NOTE — Progress Notes (Addendum)
PROGRESS NOTE    EBUNOLUWA GERNERT  QJJ:[id number] DOB: 28-Jul-1975 DOA: 10/28/2019 PCP: Patient, No Pcp Per   Chef Complaints: Fatigue, itching with 1 month diarrhea  Brief Narrative: 44 year old female with liver disease anemia chronic back pain fibroids, Graves' disease, hypertension history of substance abuse systolic heart failure tobacco abuse presented with not feeling good since her discharge on 2 July when she left without completing her work-up for liver dysfunction, refused liver biopsy.  As per review of records patient was trying to obtain her lab results from her new PCP office they wanted to see her in the office first but patient had difficulties getting them and stated that she did not want to be seen in the past.  They advised patient if she is not feeling well to present back to emergency department which she did Patient states she has been having diarrhea for the past 2 months no abdominal pain no nausea no vomiting no fever Patient recently was diagnosed with possible hepatitis of unclear etiology felt to be drug-induced.  Work-up was inconclusive.  She is supposed to undergo liver biopsy tomorrow she has not had any fevers or nausea no black stools no melena but she does endorse diarrhea.   Denies EtOh abuse,, states she quit smoking a long time ago, denies using acetaminophen,  States her liver failure came from toxins in the air or at her job when she worked  History significant for patient developing Graves' disease she stopped taking her methimazole in December 2020 and then had hyperthyroidism and was restarted on her methimazole she came to the emergency department originally with abdominal pain and diarrhea and was found to have hepatitis initial thought to be secondary to methimazole toxicity she continued to have elevated LFTs [facility] consulted and recommended liver biopsy patient refused and left AMA on the second for July plan was for her to have biopsy done as an  outpatient.  During her prior hospitalization 21st and 29 June she had unremarkable right upper quadrant ultrasound. Work-up so far included .Acute hepatitis serology negative. AMA, ASMA negative. IgM EBV and IgM CMV negative. IgM HSV 1 +ve initially ID felt that HSV hepatitis was a possibility GI recommended liver biopsy to confirm as well as HSV PCR and HCV PCR On 1 July patient was seen by interventional radiology but refused to sign a consent. Patient hospital stay was complicated by the patient refusing labs CT scan of the abdomen no HIV testing.  She was found to have positive ANA and SSA antibody but SSA titer mildly elevated and double-stranded DNA was negative she was supposed to follow-up as an outpatient rheumatology  Patient was admitted with intention for liver biopsy and further GI consultation/work-up.  Subjective: Asking for liver MD pills for detoxification, continues to have itching, does not want to take Atarax as it causes diarrhea Status post liver biopsy 10/29/2019   Assessment & Plan:  Abnormal LFTs, significantly elevated bilirubin, unclear etiology: There was concern this could be related to recent methimazole use-has been discontinued but total bili continues to rise.Patient had extensive evaluation with serology blood work GI eval and biopsy of the liver was recommended eventually which she declined and finally left the hospital.  Was supposed to see GI as outpatient.  Status post IR biopsy 7/9, LFTs trending down still significantly elevated.  Patient would like to stay another night and make sure is trending down tomorrow and then go home tomorrow.  GI has seen the patient and  signed off and follow-up with Dr. Alessandra Bevels after discharge for liver biopsy results.    Diarrhea: Likely from her hyperthyroidism/high bilirubin.  Neg C. difficile and GI panel.  Added Lomotil patient attributes diarrhea also from her Atarax  Hyperthyroidism on methimazole which is on hold  given concern for toxicity.  TSH less than 0.01 T4 4.4.  Will need to see if we can put her back on methimazole once LFTs improve/if okay with GI patient is reluctant to try methimazole since she states everything started to get worse after methimazole.  She will need endocrinology follow-up  Weight loss likely from Graves' disease.  Hypertension: Blood pressure is controlled on atenolol.  Tobacco use disorder in remission  Possible protein calorie malnutrition augment nutrition dietitian on consult  Chronic microcytic anemia: Fairly stable B12, iron level and folate no signs of acute bleeding we will continue her iron supplementation.  DVT prophylaxis: SCDs Start: 10/29/19 0207 Code Status: full code Family Communication: plan of care discussed with patient at bedside.  Status is: Inpatient, Patient remains hospitalized for ongoing monitoring of her liver function test, further work-up,  Dispo: The patient is from: Home              Anticipated d/c is to: Home              Anticipated d/c date is: 1 day              Patient currently is not medically stable to d/c.  Patient requesting to stay another day to make sure her LFTs are trending down if LFTs are trending down by Monday plan discharge on Monday.   Nutrition: Diet Order            Diet regular Room service appropriate? Yes; Fluid consistency: Thin  Diet effective now                 Interventions: Ensure Enlive (each supplement provides 350kcal and 20 grams of protein) Body mass index is 19.08 kg/m.  Consultants:see note  Procedures:see note Microbiology:see note  Medications: Scheduled Meds: . atenolol  12.5 mg Oral BID  . feeding supplement (ENSURE ENLIVE)  237 mL Oral BID BM  . ferrous sulfate  325 mg Oral Daily  . melatonin  5 mg Oral QHS  . multivitamin with minerals   Oral Daily  . sodium chloride flush  3 mL Intravenous Q12H   Continuous Infusions: . sodium chloride Stopped (10/30/19 0700)     Antimicrobials: Anti-infectives (From admission, onward)   None       Objective: Vitals: Today's Vitals   10/30/19 1935 10/30/19 2138 10/30/19 2245 10/31/19 0444  BP: (!) 143/80   (!) 168/81  Pulse: 74   79  Resp: 18   18  Temp: 97.7 F (36.5 C)   98.9 F (37.2 C)  TempSrc: Oral   Oral  SpO2: 100%   99%  Weight:      Height:      PainSc:  8  7      Intake/Output Summary (Last 24 hours) at 10/31/2019 1207 Last data filed at 10/30/2019 1740 Gross per 24 hour  Intake 360 ml  Output --  Net 360 ml   Filed Weights   10/30/19 0658  Weight: 52 kg   Weight change:    Intake/Output from previous day: 07/10 0701 - 07/11 0700 In: 1003.5 [P.O.:480; I.V.:523.5] Out: 200 [Urine:200] Intake/Output this shift: No intake/output data recorded.  Examination:  General exam: AAO , NAD,  weak appearing.  Thin and frail. HEENT:Oral mucosa moist, Ear/Nose WNL grossly, dentition normal. Respiratory system: bilaterally near,no wheezing or crackles,no use of accessory muscle Cardiovascular system: S1 & S2 +, No JVD,. Gastrointestinal system: Abdomen soft, NT,ND, BS+ Nervous System:Alert, awake, moving extremities and grossly nonfocal Extremities: No edema, distal peripheral pulses palpable.  Skin: No rashes,no icterus. MSK: Normal muscle bulk,tone, power  Data Reviewed: I have personally reviewed following labs and imaging studies CBC: Recent Labs  Lab 10/28/19 2022 10/29/19 0500 10/30/19 1201 10/31/19 0739  WBC 9.8 8.2 8.7 7.6  NEUTROABS  --  5.4  --   --   HGB 11.5* 10.0* 11.2* 10.5*  HCT 31.6* 27.2* 30.4* 28.5*  MCV 71.7* 70.6* 71.5* 71.3*  PLT 437* 387 397 182   Basic Metabolic Panel: Recent Labs  Lab 10/28/19 2022 10/29/19 0500 10/30/19 1201 10/31/19 0739  NA 138 138 137 140  K 4.0 3.2* 3.8 4.2  CL 104 106 102 101  CO2 23 24 25 27   GLUCOSE 107* 115* 90 90  BUN 10 9 12 16   CREATININE <0.30* <0.30* <0.30* <0.30*  CALCIUM 10.4* 9.7 10.0 10.0  MG  --   1.7  --   --   PHOS  --  4.0  --   --    GFR: CrCl cannot be calculated (This lab value cannot be used to calculate CrCl because it is not a number: <0.30). Liver Function Tests: Recent Labs  Lab 10/28/19 2022 10/29/19 0500 10/30/19 1201 10/31/19 0739  AST 747* 632* 738* 658*  ALT 528* 453* 491* 390*  ALKPHOS 219* 179* 197* 182*  BILITOT 30.0* 25.5*  25.4* 30.1* 26.0*  PROT 8.5* 7.0 8.2* 7.7  ALBUMIN 3.2* 2.6* 2.9* 2.7*   Recent Labs  Lab 10/28/19 2022  LIPASE 34   Recent Labs  Lab 10/29/19 0025  AMMONIA 34   Coagulation Profile: Recent Labs  Lab 10/28/19 2230 10/30/19 1201 10/31/19 0739  INR 1.1 1.1 1.2   Cardiac Enzymes: Recent Labs  Lab 10/29/19 0500  CKTOTAL 28*   BNP (last 3 results) No results for input(s): PROBNP in the last 8760 hours. HbA1C: No results for input(s): HGBA1C in the last 72 hours. CBG: No results for input(s): GLUCAP in the last 168 hours. Lipid Profile: Recent Labs    10/29/19 0500  CHOL 147  HDL <10*  LDLCALC NOT CALCULATED  TRIG 327*  CHOLHDL NOT CALCULATED   Thyroid Function Tests: Recent Labs    10/29/19 0022  TSH <0.010*  FREET4 4.46*   Anemia Panel: Recent Labs    10/29/19 0500  VITAMINB12 1,116*  FOLATE 16.6  FERRITIN 620*  TIBC 241*  IRON 207*  RETICCTPCT 2.7   Sepsis Labs: No results for input(s): PROCALCITON, LATICACIDVEN in the last 168 hours.  Recent Results (from the past 240 hour(s))  SARS Coronavirus 2 by RT PCR (hospital order, performed in [facility] hospital lab) Nasopharyngeal Nasopharyngeal Swab     Status: None   Collection Time: 10/29/19 12:07 AM   Specimen: Nasopharyngeal Swab  Result Value Ref Range Status   SARS Coronavirus 2 NEGATIVE NEGATIVE Final    Comment: (NOTE) SARS-CoV-2 target nucleic acids are NOT DETECTED.  The SARS-CoV-2 RNA is generally detectable in upper and lower respiratory specimens during the acute phase of infection. The lowest concentration of SARS-CoV-2  viral copies this assay can detect is 250 copies / mL. A negative result does not preclude SARS-CoV-2 infection and should not be used as the sole basis  for treatment or other patient management decisions.  A negative result may occur with improper specimen collection / handling, submission of specimen other than nasopharyngeal swab, presence of viral mutation(s) within the areas targeted by this assay, and inadequate number of viral copies (<250 copies / mL). A negative result must be combined with clinical observations, patient history, and epidemiological information.  Fact Sheet for Patients:   StrictlyIdeas.no  Fact Sheet for Healthcare Providers: BankingDealers.co.za  This test is not yet approved or  cleared by the [place] FDA and has been authorized for detection and/or diagnosis of SARS-CoV-2 by FDA under an Emergency Use Authorization (EUA).  This EUA will remain in effect (meaning this test can be used) for the duration of the COVID-19 declaration under Section 564(b)(1) of the Act, 21 U.S.C. section 360bbb-3(b)(1), unless the authorization is terminated or revoked sooner.  Performed at [facility], [place] [address], [place], [place] [address]   Gastrointestinal Panel by PCR , Stool     Status: None   Collection Time: 10/29/19  9:16 AM   Specimen: Stool  Result Value Ref Range Status   Campylobacter species NOT DETECTED NOT DETECTED Final   Plesimonas shigelloides NOT DETECTED NOT DETECTED Final   Salmonella species NOT DETECTED NOT DETECTED Final   Yersinia enterocolitica NOT DETECTED NOT DETECTED Final   Vibrio species NOT DETECTED NOT DETECTED Final   Vibrio cholerae NOT DETECTED NOT DETECTED Final   Enteroaggregative E coli (EAEC) NOT DETECTED NOT DETECTED Final   Enteropathogenic E coli (EPEC) NOT DETECTED NOT DETECTED Final   Enterotoxigenic E coli (ETEC) NOT DETECTED NOT DETECTED Final    Shiga like toxin producing E coli (STEC) NOT DETECTED NOT DETECTED Final   Shigella/Enteroinvasive E coli (EIEC) NOT DETECTED NOT DETECTED Final   Cryptosporidium NOT DETECTED NOT DETECTED Final   Cyclospora cayetanensis NOT DETECTED NOT DETECTED Final   Entamoeba histolytica NOT DETECTED NOT DETECTED Final   Giardia lamblia NOT DETECTED NOT DETECTED Final   Adenovirus F40/41 NOT DETECTED NOT DETECTED Final   Astrovirus NOT DETECTED NOT DETECTED Final   Norovirus GI/GII NOT DETECTED NOT DETECTED Final   Rotavirus A NOT DETECTED NOT DETECTED Final   Sapovirus (I, II, IV, and V) NOT DETECTED NOT DETECTED Final    Comment: Performed at [facility], [address] [place], [place] [address]  C Difficile Quick Screen w PCR reflex     Status: None   Collection Time: 10/29/19  9:16 AM   Specimen: Stool  Result Value Ref Range Status   C Diff antigen NEGATIVE NEGATIVE Final   C Diff toxin NEGATIVE NEGATIVE Final   C Diff interpretation No C. difficile detected.  Final    Comment: Performed at [facility], [place] [address], [place], [place] [address]      Radiology Studies: IR US Guide Bx Asp/Drain  Result Date: 10/29/2019 INDICATION: 44 year old with jaundice and elevated liver enzymes. Request for liver biopsy. EXAM: ULTRASOUND-GUIDED RANDOM LIVER BIOPSY MEDICATIONS: None. ANESTHESIA/SEDATION: Moderate (conscious) sedation was employed during this procedure. A total of Versed 2.0 mg and Fentanyl 100 mcg was administered intravenously. Moderate Sedation Time: 10 minutes. The patient's level of consciousness and vital signs were monitored continuously by radiology nursing throughout the procedure under my direct supervision. FLUOROSCOPY TIME:  None COMPLICATIONS: None immediate. PROCEDURE: Informed written consent was obtained from the patient after a thorough discussion of the procedural risks, benefits and alternatives. All questions were addressed. Maximal Sterile  Barrier Technique  was utilized including caps, mask, sterile gowns, sterile gloves, sterile drape, hand hygiene and skin antiseptic. A timeout was performed prior to the initiation of the procedure. Liver was evaluated with ultrasound. Right hepatic lobe was targeted for biopsy. Right side of the abdomen was prepped with chlorhexidine and sterile field was created. Skin was anesthetized with 1% lidocaine. Small incision was made. Using ultrasound guidance, 17 gauge coaxial needle was directed in the right hepatic lobe. Total of 3 core biopsies were obtained with an 18 gauge core device. Specimens placed in formalin. Bandage placed over the puncture site. FINDINGS: Three adequate cores obtained from the right hepatic lobe. No immediate bleeding or hematoma formation after the core biopsies. IMPRESSION: Successful ultrasound-guided random liver biopsy. Electronically Signed   By: Markus Daft M.D.   On: 10/29/2019 18:00     LOS: 2 days   Georgette Shell, MD [facility]  10/31/2019, 12:07 PM

## 2019-11-01 MED ORDER — DIPHENHYDRAMINE HCL 50 MG PO CAPS
50.0000 mg | ORAL_CAPSULE | Freq: Four times a day (QID) | ORAL | Status: DC | PRN
  Administered 2019-11-01 – 2019-11-03 (×4): 50 mg via ORAL
  Filled 2019-11-01 (×4): qty 1

## 2019-11-01 MED ORDER — CHOLESTYRAMINE LIGHT 4 G PO PACK: 4.0000 g | PACK | ORAL | Status: DC

## 2019-11-01 MED ORDER — CHOLESTYRAMINE LIGHT 4 G PO PACK
4.0000 g | PACK | ORAL | Status: DC
  Administered 2019-11-01 – 2019-11-03 (×5): 4 g via ORAL
  Filled 2019-11-01 (×5): qty 1

## 2019-11-01 NOTE — Progress Notes (Addendum)
[facility] Gastroenterology Progress Note  Mary Frey 44 y.o. 08/22/1975  CC:  Transaminitis/hyperbilirubinemia  Subjective: Patient reports severe itching unresponsive to Benadryl or Atarax. Denies any abdominal pain, nausea, or vomiting.  Continues to have loose stools without melena or hematochezia.  ROS : Review of Systems  Gastrointestinal: Positive for diarrhea. Negative for abdominal pain, blood in stool, constipation, heartburn, melena, nausea and vomiting.  Skin: Positive for itching. Negative for rash.   Objective: Vital signs in last 24 hours: Vitals:   11/01/19 0500 11/01/19 0749  BP: 132/76 140/85  Pulse: 74 72  Resp: 16   Temp: 98 F (36.7 C) 98.1 F (36.7 C)  SpO2: 100% 100%    Physical Exam:  General:  Alert, oriented, pleasant and cooperative, no acute distress; thin; appears itchy and is scratching her skin intermittently  Head:  Normocephalic, without obvious abnormality, atraumatic  Eyes:  Deep icterus, EOMs intact  Lungs:   Clear to auscultation bilaterally, respirations unlabored  Heart:  Regular rate and rhythm, S1/S2 normal  Abdomen:   Soft, non-tender, non-distended, bowel sounds active all four quadrants,  no guarding or peritoneal signs  Extremities: Extremities normal, atraumatic, no  edema  Pulses: 2+ and symmetric    Lab Results: Recent Labs    10/30/19 1201 10/31/19 0739  NA 137 140  K 3.8 4.2  CL 102 101  CO2 25 27  GLUCOSE 90 90  BUN 12 16  CREATININE <0.30* <0.30*  CALCIUM 10.0 10.0   Recent Labs    10/30/19 1201 10/31/19 0739  AST 738* 658*  ALT 491* 390*  ALKPHOS 197* 182*  BILITOT 30.1* 26.0*  PROT 8.2* 7.7  ALBUMIN 2.9* 2.7*   Recent Labs    10/30/19 1201 10/31/19 0739  WBC 8.7 7.6  HGB 11.2* 10.5*  HCT 30.4* 28.5*  MCV 71.5* 71.3*  PLT 397 362   Recent Labs    10/30/19 1201 10/31/19 0739  LABPROT 13.8 14.3  INR 1.1 1.2    Assessment: Transaminitis:unclear etiology, s/p liver biopsy 7/9 (path  pending) -T. Bili 26.0 yesterday with AST 658/ALT 390/ALP 182 -PT/INR normal as of yesterday (14.3/1.2) -Acute hepatitis panel negative 10/11/19; Hepatitis C PCR negative 10/21/19 -HSV IgM positive 10/12/19 but HSV PCR negative 10/21/19 -CMVand EBVIgM negative 10/12/19 -No deficiencies of alpha-1-antitrypsin and ceruloplasmin 10/12/19 -ASMAandAMA negative 10/15/19 -Ferritin elevated to 620, Iron elevated to 207 with saturation elevated to 86%; patient taking iron supplementation at home. -CT 10/28/19: No CT evidence for acute intra-abdominal or pelvic abnormality.  No focal liver abnormality is seen. No gallstones, gallbladder wall thickening, or biliary dilatation. -Right upper quadrant ultrasound was unremarkable 10/11/19  Graves' Disease: not currently on medication; TSH <0.010, T4 4.46 as of 7/9  Diarrhea, likely related to Graves' disease; C. Diff and GI pathogen panel negative  Weight loss, likely related to Graves' disease  Chronic microcytic anemia but no deficiency in iron, folate, or B12.  No signs of GI bleeding.  Plan: Await liver biopsy results (per pathology, preliminary read should be back within 1 to 2 days with final read within 1 week).  Recommend cholestyramine for itching: 4g BID at least 2 hours apart from other medications.  Advised patient that this may take time to work for pruritis.  Also advised patient that it can cause constipation.  Lomotil can be used PRN but do not recommend scheduled dosing while on cholestyramine.  Recommend repeat CMP prior to discharge.  Patient has follow up appointment with [facility] on 7/22.  Salley Slaughter PA-C 11/01/2019, 8:42 AM  Contact #  [phone]

## 2019-11-01 NOTE — Progress Notes (Signed)
Pt refusing labs and has been educated on why she needs her labs drawn.

## 2019-11-01 NOTE — Progress Notes (Signed)
PROGRESS NOTE    Mary Frey  TZG:[id number] DOB: 1975/09/26 DOA: 10/28/2019 PCP: Patient, No Pcp Per   Chef Complaints: Diarrhea  Brief Narrative: 44 year old female with liver disease anemia chronic back pain fibroids, Graves' disease, hypertension history of substance abuse systolic heart failure tobacco abuse presented with not feeling good since her discharge on 2 July when she left without completing her work-up for liver dysfunction, refused liver biopsy.  As per review of records patient was trying to obtain her lab results from her new PCP office they wanted to see her in the office first but patient had difficulties getting them and stated that she did not want to be seen in the past. They advised patient if she is not feeling well to present back to emergency department which she did Patient states she has been having diarrhea for the past 2 months no abdominal pain no nausea no vomiting no fever Patient recently was diagnosed with possible hepatitis of unclear etiology felt to be drug-induced. Work-up was inconclusive. She is supposed to undergo liver biopsy tomorrow she has not had any fevers or nausea no black stools no melena but she does endorse diarrhea.  Denies EtOh abuse,, states she quit smoking a long time ago, denies using acetaminophen,  States her liver failure came from toxins in the air or at her job when she worked History significant for patient developing Graves' disease she stopped taking her methimazole in December 2020 and then had hyperthyroidism and was restarted on her methimazole she came to the emergency department originally with abdominal pain and diarrhea and was found to have hepatitis initial thought to be secondary to methimazole toxicity she [facility] to [facility] and recommended liver biopsy patient refused and left AMA on the second for July plan was for her to have biopsy done as an outpatient.  During her prior  hospitalization 21st and 29 June she had unremarkable right upper quadrant ultrasound. Work-up so far included.Acute hepatitis serology negative. AMA, ASMA negative. IgM EBV and IgM CMV negative. IgM HSV 1 +veinitially ID felt that HSV hepatitis was a possibility GI recommended liver biopsy to confirm as well as HSV PCR and HCV PCR On 1 July patient was seen by interventional radiology but refused to sign a consent. Patient hospital stay was complicated by the patient refusing labs CT scan of the abdomen no HIV testing.  She was found to have positive ANA and SSA antibody but SSA titer mildly elevated and double-stranded DNA was negative she was supposed to follow-up as an outpatient rheumatology  Patient was admitted with intention for liver biopsy and further GI consultation/work-up. Patient underwent liver biopsy by IR 7/9  Subjective: Complains of ongoing severe itching not relieved by Atarax or Benadryl. No nausea vomiting fever chills.  Refused blood work today.  Assessment & Plan: Active Problems:   Cocaine abuse (HCC)   Protein-calorie malnutrition, severe (HCC)   HTN (hypertension)   Microcytic anemia   Tobacco use disorder   Abnormal LFTs   Liver failure (HCC)   Abnormal liver function   Jaundice  Abnormal LFTs, significantly elevated bilirubin, unclear etiology: There was concern this could be related to recent methimazole use-has been discontinued but total bili continues to rise.Patient had extensive evaluation with serology blood work GI eval and biopsy of the liver was recommended eventually which she declined and finally left the hospital. Was supposed to see GI as outpatient.Status post IR biopsy 7/9, Patient underwent liver biopsy by IR 7/9.  She is followed very closely by GI LFTs remain overall stable PT/INR normal, total bili 25 to 30.  Patient complains of severe itching, discussed with GI continue Atarax can try Benadryl added cholestyramine twice daily, will  monitor overnight Discussed W/ GI today.  Diarrhea:Likely from her hyperthyroidism/high bilirubin.[place]. difficile and GI panel.  Continue Lomotil. also cholestyramine added.  Hyperthyroidism on methimazole which is on hold given concern for toxicity. TSH less than 0.01 T4 4.4.  She will need to follow-up with endocrinologist, referral/phone number provided  Weight loss likely from Graves' disease.  Hypertension:BP is controlled on atenolol.    Tobacco use disorder in remission  Possible protein calorie malnutrition augment nutrition dietitian on consult  Chronic microcytic anemia: Fairly stable B12, iron level and folate.  No signs of active bleeding.  Monitor hemoglobin intermittently.   DVT prophylaxis: SCDs Start: 10/29/19 0207 Code Status: FULL Family Communication: plan of care discussed with patient at bedside.  Status is: Inpatient Remains inpatient appropriate because:DUE TO Ongoing severe itching and trying new medication regimen Dispo: The patient is from: Home              Anticipated d/c is to: Home              Anticipated d/c date is: 1 day              Patient currently is not medically stable to d/c. Nutrition: Diet Order            Diet regular Room service appropriate? Yes; Fluid consistency: Thin  Diet effective now                 Nutrition Problem: Increased nutrient needs Etiology: chronic illness (HF) Signs/Symptoms: estimated needs Interventions: Ensure Enlive (each supplement provides 350kcal and 20 grams of protein) Body mass index is 19.08 kg/m.  Consultants:GI Procedures:see note Microbiology:see note  Medications: Scheduled Meds: . atenolol  12.5 mg Oral BID  . cholestyramine light  4 g Oral 2 times per day  . feeding supplement (ENSURE ENLIVE)  237 mL Oral BID BM  . ferrous sulfate  325 mg Oral Daily  . melatonin  5 mg Oral QHS  . multivitamin with minerals   Oral Daily  . sodium chloride flush  3 mL Intravenous Q12H     Continuous Infusions:  Antimicrobials: Anti-infectives (From admission, onward)   None       Objective: Vitals: Today's Vitals   11/01/19 0500 11/01/19 0749 11/01/19 0822 11/01/19 1413  BP: 132/76 140/85  130/84  Pulse: 74 72  82  Resp: 16   20  Temp: 98 F (36.7 C) 98.1 F (36.7 C)  97.9 F (36.6 C)  TempSrc: Oral Oral  Oral  SpO2: 100% 100%  99%  Weight:      Height:      PainSc:   0-No pain     Intake/Output Summary (Last 24 hours) at 11/01/2019 1423 Last data filed at 11/01/2019 1300 Gross per 24 hour  Intake 360 ml  Output --  Net 360 ml   Filed Weights   10/30/19 0658  Weight: 52 kg   Weight change:    Intake/Output from previous day: No intake/output data recorded. Intake/Output this shift: Total I/O In: 360 [P.O.:360] Out: -   Examination:  General exam: AAO x3, thin and frail, not in distress HEENT:Oral mucosa moist, Ear/Nose WNL grossly,dentition normal.  Icterus present Respiratory system: bilaterally CLEAR ,no wheezing or crackles,no use of accessory muscle, non tender. Cardiovascular  system: S1 & S2 +, regular, No JVD. Gastrointestinal system: Abdomen soft, NT,ND, BS+. Nervous System:Alert, awake, moving extremities and grossly nonfocal Extremities: No edema, distal peripheral pulses palpable.  Skin: No rashes,no icterus. MSK: Normal muscle bulk,tone, power  Data Reviewed: I have personally reviewed following labs and imaging studies CBC: Recent Labs  Lab 10/28/19 2022 10/29/19 0500 10/30/19 1201 10/31/19 0739  WBC 9.8 8.2 8.7 7.6  NEUTROABS  --  5.4  --   --   HGB 11.5* 10.0* 11.2* 10.5*  HCT 31.6* 27.2* 30.4* 28.5*  MCV 71.7* 70.6* 71.5* 71.3*  PLT 437* 387 397 038   Basic Metabolic Panel: Recent Labs  Lab 10/28/19 2022 10/29/19 0500 10/30/19 1201 10/31/19 0739  NA 138 138 137 140  K 4.0 3.2* 3.8 4.2  CL 104 106 102 101  CO2 23 24 25 27   GLUCOSE 107* 115* 90 90  BUN 10 9 12 16   CREATININE <0.30* <0.30* <0.30*  <0.30*  CALCIUM 10.4* 9.7 10.0 10.0  MG  --  1.7  --   --   PHOS  --  4.0  --   --    GFR: CrCl cannot be calculated (This lab value cannot be used to calculate CrCl because it is not a number: <0.30). Liver Function Tests: Recent Labs  Lab 10/28/19 2022 10/29/19 0500 10/30/19 1201 10/31/19 0739  AST 747* 632* 738* 658*  ALT 528* 453* 491* 390*  ALKPHOS 219* 179* 197* 182*  BILITOT 30.0* 25.5*  25.4* 30.1* 26.0*  PROT 8.5* 7.0 8.2* 7.7  ALBUMIN 3.2* 2.6* 2.9* 2.7*   Recent Labs  Lab 10/28/19 2022  LIPASE 34   Recent Labs  Lab 10/29/19 0025  AMMONIA 34   Coagulation Profile: Recent Labs  Lab 10/28/19 2230 10/30/19 1201 10/31/19 0739  INR 1.1 1.1 1.2   Cardiac Enzymes: Recent Labs  Lab 10/29/19 0500  CKTOTAL 28*   BNP (last 3 results) No results for input(s): PROBNP in the last 8760 hours. HbA1C: No results for input(s): HGBA1C in the last 72 hours. CBG: No results for input(s): GLUCAP in the last 168 hours. Lipid Profile: No results for input(s): CHOL, HDL, LDLCALC, TRIG, CHOLHDL, LDLDIRECT in the last 72 hours. Thyroid Function Tests: No results for input(s): TSH, T4TOTAL, FREET4, T3FREE, THYROIDAB in the last 72 hours. Anemia Panel: No results for input(s): VITAMINB12, FOLATE, FERRITIN, TIBC, IRON, RETICCTPCT in the last 72 hours. Sepsis Labs: No results for input(s): PROCALCITON, LATICACIDVEN in the last 168 hours.  Recent Results (from the past 240 hour(s))  SARS Coronavirus 2 by RT PCR (hospital order, performed in [facility] hospital lab) Nasopharyngeal Nasopharyngeal Swab     Status: None   Collection Time: 10/29/19 12:07 AM   Specimen: Nasopharyngeal Swab  Result Value Ref Range Status   SARS Coronavirus 2 NEGATIVE NEGATIVE Final    Comment: (NOTE) SARS-CoV-2 target nucleic acids are NOT DETECTED.  The SARS-CoV-2 RNA is generally detectable in upper and lower respiratory specimens during the acute phase of infection. The  lowest concentration of SARS-CoV-2 viral copies this assay can detect is 250 copies / mL. A negative result does not preclude SARS-CoV-2 infection and should not be used as the sole basis for treatment or other patient management decisions.  A negative result may occur with improper specimen collection / handling, submission of specimen other than nasopharyngeal swab, presence of viral mutation(s) within the areas targeted by this assay, and inadequate number of viral copies (<250 copies / mL). A negative  result must be combined with clinical observations, patient history, and epidemiological information.  Fact Sheet for Patients:   StrictlyIdeas.no  Fact Sheet for Healthcare Providers: BankingDealers.co.za  This test is not yet approved or  cleared by the [place] FDA and has been authorized for detection and/or diagnosis of SARS-CoV-2 by FDA under an Emergency Use Authorization (EUA).  This EUA will remain in effect (meaning this test can be used) for the duration of the COVID-19 declaration under Section 564(b)(1) of the Act, 21 U.S.C. section 360bbb-3(b)(1), unless the authorization is terminated or revoked sooner.  Performed at [facility], [place] [address] [place], [place] [address]   Gastrointestinal Panel by PCR , Stool     Status: None   Collection Time: 10/29/19  9:16 AM   Specimen: Stool  Result Value Ref Range Status   Campylobacter species NOT DETECTED NOT DETECTED Final   Plesimonas shigelloides NOT DETECTED NOT DETECTED Final   Salmonella species NOT DETECTED NOT DETECTED Final   Yersinia enterocolitica NOT DETECTED NOT DETECTED Final   Vibrio species NOT DETECTED NOT DETECTED Final   Vibrio cholerae NOT DETECTED NOT DETECTED Final   Enteroaggregative E coli (EAEC) NOT DETECTED NOT DETECTED Final   Enteropathogenic E coli (EPEC) NOT DETECTED NOT DETECTED Final   Enterotoxigenic E coli (ETEC)  NOT DETECTED NOT DETECTED Final   Shiga like toxin producing E coli (STEC) NOT DETECTED NOT DETECTED Final   Shigella/Enteroinvasive E coli (EIEC) NOT DETECTED NOT DETECTED Final   Cryptosporidium NOT DETECTED NOT DETECTED Final   Cyclospora cayetanensis NOT DETECTED NOT DETECTED Final   Entamoeba histolytica NOT DETECTED NOT DETECTED Final   Giardia lamblia NOT DETECTED NOT DETECTED Final   Adenovirus F40/41 NOT DETECTED NOT DETECTED Final   Astrovirus NOT DETECTED NOT DETECTED Final   Norovirus GI/GII NOT DETECTED NOT DETECTED Final   Rotavirus A NOT DETECTED NOT DETECTED Final   Sapovirus (I, II, IV, and V) NOT DETECTED NOT DETECTED Final    Comment: Performed at [facility], [place] [address] [place], [place] [address]  C Difficile Quick Screen w PCR reflex     Status: None   Collection Time: 10/29/19  9:16 AM   Specimen: Stool  Result Value Ref Range Status   C Diff antigen NEGATIVE NEGATIVE Final   C Diff toxin NEGATIVE NEGATIVE Final   C Diff interpretation No C. difficile detected.  Final    Comment: Performed at [facility], [place] [address], [place], [place] [address]  Culture, Urine     Status: None   Collection Time: 10/30/19 12:11 PM   Specimen: Urine, Random  Result Value Ref Range Status   Specimen Description   Final    URINE, RANDOM Performed at [place] [address] [place], [place] [address]    Special Requests   Final    NONE Performed at [facility], [place] [address], [place], [place] [address]    Culture   Final    NO GROWTH Performed at [facility], [place] [address] [place], [place] [address]    Report Status 10/31/2019 FINAL  Final      Radiology Studies: No results found.   LOS: 3 days   Antonieta Pert, MD [facility]  11/01/2019, 2:23 PM

## 2019-11-01 NOTE — Progress Notes (Signed)
RN agrees with previous nurse assessment. Patient resting comfortably. RN will continue to monitor the patient.

## 2019-11-01 NOTE — Discharge Summary (Signed)
Physician Discharge Summary  Mary Frey RFF:[id number] DOB: January 24, 1976 DOA: 10/28/2019  PCP: Patient, No Pcp Per  Admit date: 10/28/2019 Discharge date: 11/03/2019  Admitted From: home Disposition: Home  Recommendations for Outpatient Follow-up:  1. Follow up with PCP/gi in 5 days week to check your liver function test 2. Follow-up with GI as scheduled on 7/22 FOR LIVER BIOPSY RESULTS 3. Please follow-up on your liver biopsy results 4. Please follow-up with endocrinology to address your hypothyroidism 5. Please follow up on the following pending results:  Home Health:no  Equipment/Devices: none  Discharge Condition: Stable Code Status: full Diet recommendation:  Diet Order            Diet - low sodium heart healthy           Diet regular Room service appropriate? Yes; Fluid consistency: Thin  Diet effective now                 Brief/Interim Summary: 44 year old female with liver disease anemia chronic back pain fibroids, Graves' disease, hypertension history of substance abuse systolic heart failure tobacco abuse presented with not feeling good since her discharge on 2 July when she left without completing her work-up for liver dysfunction, refused liver biopsy.  As per review of records patient was trying to obtain her lab results from her new PCP office they wanted to see her in the office first but patient had difficulties getting them and stated that she did not want to be seen in the past. They advised patient if she is not feeling well to present back to emergency department which she did Patient states she has been having diarrhea for the past 2 months no abdominal pain no nausea no vomiting no fever Patient recently was diagnosed with possible hepatitis of unclear etiology felt to be drug-induced. Work-up was inconclusive. She is supposed to undergo liver biopsy tomorrow she has not had any fevers or nausea no black stools no melena but she does endorse  diarrhea.  Denies EtOh abuse,, states she quit smoking a long time ago, denies using acetaminophen,  States her liver failure came from toxins in the air or at her job when she worked History significant for patient developing Graves' disease she stopped taking her methimazole in December 2020 and then had hyperthyroidism and was restarted on her methimazole she came to the emergency department originally with abdominal pain and diarrhea and was found to have hepatitis initial thought to be secondary to methimazole toxicity she [facility] to [facility] and recommended liver biopsy patient refused and left AMA on the second for July plan was for her to have biopsy done as an outpatient.  During her prior hospitalization 21st and 29 June she had unremarkable right upper quadrant ultrasound. Work-up so far included.Acute hepatitis serology negative. AMA, ASMA negative. IgM EBV and IgM CMV negative. IgM HSV 1 +veinitially ID felt that HSV hepatitis was a possibility GI recommended liver biopsy to confirm as well as HSV PCR and HCV PCR On 1 July patient was seen by interventional radiology but refused to sign a consent. Patient hospital stay was complicated by the patient refusing labs CT scan of the abdomen no HIV testing.  She was found to have positive ANA and SSA antibody but SSA titer mildly elevated and double-stranded DNA was negative she was supposed to follow-up as an outpatient rheumatology  Patient was admitted with intention for liver biopsy and further GI consultation/work-up. Patient underwent liver biopsy by IR 7/9 She  is followed very closely by GI LFTs remain overall stable PT/INR normal, total bili 25 to 30. Patient has been complaining of severe itching tried multiple medication without much improvement, GI saw the patient patient was started on cholestyramine and she felt her itching and diarrhea was improving. We have been trying for several days  now without successful for blood draws despite multiple request personally myself every morning. Patient has been deferring blood work.  She says that "I can feel" if my liver is getting worse, "I do not feel that way right now and I do not want to have any blood drawn".  After extended discussion patient was agreeable for blood draw however at the time of blood draw GI informed her that liver biopsy will not be back until for few days likely next week-after hearing this patient declined further the blood draw- "I will go home follow-up with my gastroenterology". She state she will get outpatient liver function test done and follow-up with them for biopsy results. As per GI no further inpatient work-up/treatment plan at this time and okay for discharge.  Discharge Diagnoses:  Active Problems:   Cocaine abuse (HCC)   Protein-calorie malnutrition, severe (HCC)   HTN (hypertension)   Microcytic anemia   Tobacco use disorder   Abnormal LFTs   Liver failure (HCC)   Abnormal liver function   Jaundice  Abnormal LFTs, significantly elevated bilirubin, unclear etiology: Status post biopsy awaiting for biopsy results.  Despite multiple requests from multiple doctors unable to get blood draw to monitor her electrolyte status.  See above.  She is going to be following up with GI as outpatient discussed with GI no further inpatient hospitalization needed at this time.  Patient is alert oriented x3 no evidence of encephalopathy.    Diarrhea: Continue supportive measures cholestyramine improving.  Work-up was negativein gi panel  Hyperthyroidism on methimazole which is on hold given concern for toxicity.  TSH less than 0.01 T4 4.4.   She will need to follow-up with endocrinologist, referral/phone number provided.  Patient is reluctant to resume any thyroid meds.   Again reinforced for the need to follow-up with PCP and/or endocrinologist.  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  Tobacco use disorder in remission  Possible protein calorie malnutrition augment nutrition  Chronic microcytic anemia: Fairly stable B12, iron level and folate no signs of acute bleeding we will continue her iron supplementation.  Consults:  GI and IR Subjective: Resting comfortably.  Mild itching no other new complaints no tremors.  She is alert awake oriented x3.two  Lab personnel in the room for blood draw, I also came to convince the patient for blood draw- patient refused. She was getting dressed to go home. " Since my biopsy result is not coming back until next week I will go home today"  Discharge Exam: Vitals:   11/03/19 0516 11/03/19 0709  BP: (!) 136/93 118/75  Pulse: 77 (!) 55  Resp: 18   Temp: 98.9 F (37.2 C) 98.7 F (37.1 C)  SpO2: 100% 100%   General: Pt is alert, awake, not in acute distress Cardiovascular: RRR, S1/S2 +, no rubs, no gallops Respiratory: CTA bilaterally, no wheezing, no rhonchi Abdominal: Soft, NT, ND, bowel sounds + Extremities: no edema, no cyanosis  Discharge Instructions  Discharge Instructions    Diet - low sodium heart healthy   Complete by: As directed    Discharge instructions   Complete by: As directed  Please follow-up with your primary care/GI doctor as instructed, you need to have your liver function in 5-7 days.    If you have abdominal pain fever chills nausea vomiting any kind of worsening symptoms, worsening itching, tremors, confusion-please seek immediate medical attention.  Please call call MD or return to ER for similar or worsening recurring problem that brought you to hospital or if any fever,nausea/vomiting,abdominal pain, uncontrolled pain, chest pain,  shortness of breath or any other alarming symptoms.  Please follow-up with the endocrinologist for the management of her hyperthyroidism.  Please avoid alcohol, smoking, or any other illicit substance and maintain healthy habits including taking  your regular medications as prescribed.  You were cared for by a hospitalist during your hospital stay. If you have any questions about your discharge medications or the care you received while you were in the hospital after you are discharged, you can call the unit and ask to speak with the hospitalist on call if the hospitalist that took care of you is not available.  Once you are discharged, your primary care physician will handle any further medical issues. Please note that NO REFILLS for any discharge medications will be authorized once you are discharged, as it is imperative that you return to your primary care physician (or establish a relationship with a primary care physician if you do not have one) for your aftercare needs so that they can reassess your need for medications and monitor your lab values   Increase activity slowly   Complete by: As directed    No wound care   Complete by: As directed      Allergies as of 11/03/2019      Reactions   Hydrocodone Nausea And Vomiting   Methimazole Diarrhea   Liver toxicity   Ptu [propylthiouracil] Other (See Comments)   Liver toxicity   Latex Itching, Rash      Medication List    TAKE these medications   atenolol 25 MG tablet Commonly known as: TENORMIN Take 0.5 tablets (12.5 mg total) by mouth 2 (two) times daily.   cholestyramine light 4 g packet Commonly known as: PREVALITE Take 1 packet (4 g total) by mouth 2 (two) times daily. Mix with 4-6 oz liquid.  Take other meds 1 hr before or 4-6 hr after cholestyramine.   diphenoxylate-atropine 2.5-0.025 MG tablet Commonly known as: LOMOTIL Take 1 tablet by mouth 4 (four) times daily as needed for up to 20 doses for diarrhea or loose stools.   ferrous sulfate 325 (65 FE) MG EC tablet Take 325 mg by mouth daily with breakfast.   FeroSul 325 (65 FE) MG tablet Generic drug: ferrous sulfate Take 325 mg by mouth daily.   hydrOXYzine 10 MG/5ML syrup Commonly known as: ATARAX Take  5 mLs (10 mg total) by mouth 3 (three) times daily as needed for itching.   melatonin 5 MG Tabs Take 5 mg by mouth at bedtime.   MULTIVITAMIN ADULTS PO Take 1 tablet by mouth daily.       Follow-up Information    Otis Brace, MD. Call.   Specialty: Gastroenterology Why: You have a follow-up scheduled on 7/22 Contact information: [address] [place] [address] [phone]        [place] Follow up in 1 week(s).   Contact information: [place] [address] [phone]       Reynold Bowen, MD. Call.   Specialty: Endocrinology Why: To  discuss about her hyperthyroidism management Contact information: [address] [place] [address] [phone]              Allergies  Allergen Reactions  . Hydrocodone Nausea And Vomiting  . Methimazole Diarrhea    Liver toxicity   . Ptu [Propylthiouracil] Other (See Comments)    Liver toxicity   . Latex Itching and Rash    The results of significant diagnostics from this hospitalization (including imaging, microbiology, ancillary and laboratory) are listed below for reference.    Microbiology: Recent Results (from the past 240 hour(s))  SARS Coronavirus 2 by RT PCR (hospital order, performed in [facility] hospital lab) Nasopharyngeal Nasopharyngeal Swab     Status: None   Collection Time: 10/29/19 12:07 AM   Specimen: Nasopharyngeal Swab  Result Value Ref Range Status   SARS Coronavirus 2 NEGATIVE NEGATIVE Final    Comment: (NOTE) SARS-CoV-2 target nucleic acids are NOT DETECTED.  The SARS-CoV-2 RNA is generally detectable in upper and lower respiratory specimens during the acute phase of infection. The lowest concentration of SARS-CoV-2 viral copies this assay can detect is 250 copies / mL. A negative result does not preclude SARS-CoV-2 infection and should not be used as the sole basis for treatment or other patient  management decisions.  A negative result may occur with improper specimen collection / handling, submission of specimen other than nasopharyngeal swab, presence of viral mutation(s) within the areas targeted by this assay, and inadequate number of viral copies (<250 copies / mL). A negative result must be combined with clinical observations, patient history, and epidemiological information.  Fact Sheet for Patients:   StrictlyIdeas.no  Fact Sheet for Healthcare Providers: BankingDealers.co.za  This test is not yet approved or  cleared by the [place] FDA and has been authorized for detection and/or diagnosis of SARS-CoV-2 by FDA under an Emergency Use Authorization (EUA).  This EUA will remain in effect (meaning this test can be used) for the duration of the COVID-19 declaration under Section 564(b)(1) of the Act, 21 U.S.C. section 360bbb-3(b)(1), unless the authorization is terminated or revoked sooner.  Performed at [facility], [place] [address] [place], [place] [address]   Gastrointestinal Panel by PCR , Stool     Status: None   Collection Time: 10/29/19  9:16 AM   Specimen: Stool  Result Value Ref Range Status   Campylobacter species NOT DETECTED NOT DETECTED Final   Plesimonas shigelloides NOT DETECTED NOT DETECTED Final   Salmonella species NOT DETECTED NOT DETECTED Final   Yersinia enterocolitica NOT DETECTED NOT DETECTED Final   Vibrio species NOT DETECTED NOT DETECTED Final   Vibrio cholerae NOT DETECTED NOT DETECTED Final   Enteroaggregative E coli (EAEC) NOT DETECTED NOT DETECTED Final   Enteropathogenic E coli (EPEC) NOT DETECTED NOT DETECTED Final   Enterotoxigenic E coli (ETEC) NOT DETECTED NOT DETECTED Final   Shiga like toxin producing E coli (STEC) NOT DETECTED NOT DETECTED Final   Shigella/Enteroinvasive E coli (EIEC) NOT DETECTED NOT DETECTED Final   Cryptosporidium NOT DETECTED NOT  DETECTED Final   Cyclospora cayetanensis NOT DETECTED NOT DETECTED Final   Entamoeba histolytica NOT DETECTED NOT DETECTED Final   Giardia lamblia NOT DETECTED NOT DETECTED Final   Adenovirus F40/41 NOT DETECTED NOT DETECTED Final   Astrovirus NOT DETECTED NOT DETECTED Final   Norovirus GI/GII NOT DETECTED NOT DETECTED Final   Rotavirus A NOT DETECTED NOT DETECTED Final   Sapovirus (I, II, IV, and V) NOT DETECTED  NOT DETECTED Final    Comment: Performed at [facility], [place], [place] [address]  C Difficile Quick Screen w PCR reflex     Status: None   Collection Time: 10/29/19  9:16 AM   Specimen: Stool  Result Value Ref Range Status   C Diff antigen NEGATIVE NEGATIVE Final   C Diff toxin NEGATIVE NEGATIVE Final   C Diff interpretation No C. difficile detected.  Final    Comment: Performed at [facility], [place] [address], [place], [place] [address]  Culture, Urine     Status: None   Collection Time: 10/30/19 12:11 PM   Specimen: Urine, Random  Result Value Ref Range Status   Specimen Description   Final    URINE, RANDOM Performed at [place] [address], [place], [place] [address]    Special Requests   Final    NONE Performed at [facility], [place] [address], [place], [place] [address]    Culture   Final    NO GROWTH Performed at [facility], [place] [address] [place], [place] [address]    Report Status 10/31/2019 FINAL  Final    Procedures/Studies: US Abdomen Complete  Result Date: 10/19/2019 CLINICAL DATA:  Abnormal LFTs EXAM: ABDOMEN ULTRASOUND COMPLETE COMPARISON:  None. FINDINGS: Gallbladder: Gallbladder appears contracted with mild wall thickening, 4 mm. No visible stones or sonographic Murphy sign. Common bile duct: Diameter: Normal caliber, 3 mm Liver: No focal lesion identified. Within normal limits in parenchymal echogenicity. Portal vein is patent on color Doppler imaging  with normal direction of blood flow towards the liver. IVC: No abnormality visualized. Pancreas: Visualized portion unremarkable. Spleen: Size and appearance within normal limits. Right Kidney: Length: 12.5 cm. Echogenicity within normal limits. No mass or hydronephrosis visualized. Left Kidney: Length: 11.9 cm. Echogenicity within normal limits. No mass or hydronephrosis visualized. Abdominal aorta: No aneurysm visualized. Other findings: None. IMPRESSION: Contracted gallbladder with mild wall thickening. No visible gallstones. Electronically Signed   By: Rolm Baptise M.D.   On: 10/19/2019 15:53   CT Abdomen Pelvis W Contrast  Result Date: 10/28/2019 CLINICAL DATA:  Abdomen distension and diarrhea EXAM: CT ABDOMEN AND PELVIS WITH CONTRAST TECHNIQUE: Multidetector CT imaging of the abdomen and pelvis was performed using the standard protocol following bolus administration of intravenous contrast. CONTRAST:  181mL OMNIPAQUE IOHEXOL 300 MG/ML  SOLN COMPARISON:  Ultrasound 10/19/2019, 03/15/2015 FINDINGS: Lower chest: No acute abnormality. Hepatobiliary: No focal liver abnormality is seen. No gallstones, gallbladder wall thickening, or biliary dilatation. Pancreas: Unremarkable. No pancreatic ductal dilatation or surrounding inflammatory changes. Spleen: Normal in size without focal abnormality. Adrenals/Urinary Tract: Adrenal glands are unremarkable. Kidneys are normal, without renal calculi, focal lesion, or hydronephrosis. Bladder is unremarkable. Stomach/Bowel: Stomach is within normal limits. Appendix appears normal. No evidence of bowel wall thickening, distention, or inflammatory changes. Vascular/Lymphatic: No significant vascular findings are present. No enlarged abdominal or pelvic lymph nodes. Reproductive: Heterogenous posterior fundal uterine mass measuring 6.3 by 6.1 by 4.9 cm, consistent with fibroid. 4.6 x 4.6 cm right adnexal cyst. Other: Negative for free air or free fluid. Musculoskeletal: No  acute or significant osseous findings. IMPRESSION: 1. No CT evidence for acute intra-abdominal or pelvic abnormality. 2. Heterogenous posterior fundal fibroid. 4.6 cm right adnexal cyst. Electronically Signed   By: Donavan Foil M.D.   On: 10/28/2019 23:29   IR US Guide Bx Asp/Drain  Result Date: 10/29/2019 INDICATION: 44 year old with jaundice and elevated  liver enzymes. Request for liver biopsy. EXAM: ULTRASOUND-GUIDED RANDOM LIVER BIOPSY MEDICATIONS: None. ANESTHESIA/SEDATION: Moderate (conscious) sedation was employed during this procedure. A total of Versed 2.0 mg and Fentanyl 100 mcg was administered intravenously. Moderate Sedation Time: 10 minutes. The patient's level of consciousness and vital signs were monitored continuously by radiology nursing throughout the procedure under my direct supervision. FLUOROSCOPY TIME:  None COMPLICATIONS: None immediate. PROCEDURE: Informed written consent was obtained from the patient after a thorough discussion of the procedural risks, benefits and alternatives. All questions were addressed. Maximal Sterile Barrier Technique was utilized including caps, mask, sterile gowns, sterile gloves, sterile drape, hand hygiene and skin antiseptic. A timeout was performed prior to the initiation of the procedure. Liver was evaluated with ultrasound. Right hepatic lobe was targeted for biopsy. Right side of the abdomen was prepped with chlorhexidine and sterile field was created. Skin was anesthetized with 1% lidocaine. Small incision was made. Using ultrasound guidance, 17 gauge coaxial needle was directed in the right hepatic lobe. Total of 3 core biopsies were obtained with an 18 gauge core device. Specimens placed in formalin. Bandage placed over the puncture site. FINDINGS: Three adequate cores obtained from the right hepatic lobe. No immediate bleeding or hematoma formation after the core biopsies. IMPRESSION: Successful ultrasound-guided random liver biopsy. Electronically  Signed   By: Markus Daft M.D.   On: 10/29/2019 18:00   [place] EKG SITE RITE  Result Date: 10/16/2019 If Site Rite image not attached, placement could not be confirmed due to current cardiac rhythm.  US Abdomen Limited RUQ  Result Date: 10/11/2019 CLINICAL DATA:  Elevated liver function EXAM: ULTRASOUND ABDOMEN LIMITED RIGHT UPPER QUADRANT COMPARISON:  None. FINDINGS: Gallbladder: No gallstones or wall thickening visualized. No sonographic Murphy sign noted by sonographer. Common bile duct: Diameter: 4.2 mm Liver: No focal lesion identified. Within normal limits in parenchymal echogenicity. Portal vein is patent on color Doppler imaging with normal direction of blood flow towards the liver. Other: None. IMPRESSION: Negative right upper quadrant abdominal ultrasound Electronically Signed   By: Donavan Foil M.D.   On: 10/11/2019 23:15    Labs: BNP (last 3 results) Recent Labs    10/28/19 2022  BNP 40.9   Basic Metabolic Panel: Recent Labs  Lab 10/28/19 2022 10/29/19 0500 10/30/19 1201 10/31/19 0739  NA 138 138 137 140  K 4.0 3.2* 3.8 4.2  CL 104 106 102 101  CO2 23 24 25 27   GLUCOSE 107* 115* 90 90  BUN 10 9 12 16   CREATININE <0.30* <0.30* <0.30* <0.30*  CALCIUM 10.4* 9.7 10.0 10.0  MG  --  1.7  --   --   PHOS  --  4.0  --   --    Liver Function Tests: Recent Labs  Lab 10/28/19 2022 10/29/19 0500 10/30/19 1201 10/31/19 0739  AST 747* 632* 738* 658*  ALT 528* 453* 491* 390*  ALKPHOS 219* 179* 197* 182*  BILITOT 30.0* 25.5*  25.4* 30.1* 26.0*  PROT 8.5* 7.0 8.2* 7.7  ALBUMIN 3.2* 2.6* 2.9* 2.7*   Recent Labs  Lab 10/28/19 2022  LIPASE 34   Recent Labs  Lab 10/29/19 0025  AMMONIA 34   CBC: Recent Labs  Lab 10/28/19 2022 10/29/19 0500 10/30/19 1201 10/31/19 0739  WBC 9.8 8.2 8.7 7.6  NEUTROABS  --  5.4  --   --   HGB 11.5* 10.0* 11.2* 10.5*  HCT 31.6* 27.2* 30.4* 28.5*  MCV 71.7* 70.6* 71.5* 71.3*  PLT 437* 387 397  362   Cardiac Enzymes: Recent Labs   Lab 10/29/19 0500  CKTOTAL 28*   BNP: Invalid input(s): POCBNP CBG: No results for input(s): GLUCAP in the last 168 hours. D-Dimer No results for input(s): DDIMER in the last 72 hours. Hgb A1c No results for input(s): HGBA1C in the last 72 hours. Lipid Profile No results for input(s): CHOL, HDL, LDLCALC, TRIG, CHOLHDL, LDLDIRECT in the last 72 hours. Thyroid function studies No results for input(s): TSH, T4TOTAL, T3FREE, THYROIDAB in the last 72 hours.  Invalid input(s): FREET3 Anemia work up No results for input(s): VITAMINB12, FOLATE, FERRITIN, TIBC, IRON, RETICCTPCT in the last 72 hours. Urinalysis    Component Value Date/Time   COLORURINE AMBER (A) 10/30/2019 1211   APPEARANCEUR HAZY (A) 10/30/2019 1211   LABSPEC 1.013 10/30/2019 1211   PHURINE 6.0 10/30/2019 1211   GLUCOSEU NEGATIVE 10/30/2019 1211   HGBUR NEGATIVE 10/30/2019 1211   BILIRUBINUR MODERATE (A) 10/30/2019 1211   KETONESUR NEGATIVE 10/30/2019 1211   PROTEINUR 30 (A) 10/30/2019 1211   UROBILINOGEN 0.2 03/30/2019 1215   NITRITE NEGATIVE 10/30/2019 1211   LEUKOCYTESUR NEGATIVE 10/30/2019 1211   Sepsis Labs Invalid input(s): PROCALCITONIN,  WBC,  LACTICIDVEN Microbiology Recent Results (from the past 240 hour(s))  SARS Coronavirus 2 by RT PCR (hospital order, performed in [facility] [place] hospital lab) Nasopharyngeal Nasopharyngeal Swab     Status: None   Collection Time: 10/29/19 12:07 AM   Specimen: Nasopharyngeal Swab  Result Value Ref Range Status   SARS Coronavirus 2 NEGATIVE NEGATIVE Final    Comment: (NOTE) SARS-CoV-2 target nucleic acids are NOT DETECTED.  The SARS-CoV-2 RNA is generally detectable in upper and lower respiratory specimens during the acute phase of infection. The lowest concentration of SARS-CoV-2 viral copies this assay can detect is 250 copies / mL. A negative result does not preclude SARS-CoV-2 infection and should not be used as the sole basis for treatment or other patient  management decisions.  A negative result may occur with improper specimen collection / handling, submission of specimen other than nasopharyngeal swab, presence of viral mutation(s) within the areas targeted by this assay, and inadequate number of viral copies (<250 copies / mL). A negative result must be combined with clinical observations, patient history, and epidemiological information.  Fact Sheet for Patients:   StrictlyIdeas.no  Fact Sheet for Healthcare Providers: BankingDealers.co.za  This test is not yet approved or  cleared by the [place] FDA and has been authorized for detection and/or diagnosis of SARS-CoV-2 by FDA under an Emergency Use Authorization (EUA).  This EUA will remain in effect (meaning this test can be used) for the duration of the COVID-19 declaration under Section 564(b)(1) of the Act, 21 U.S.C. section 360bbb-3(b)(1), unless the authorization is terminated or revoked sooner.  Performed at [facility], [place] [address], [place], [place] [address]   Gastrointestinal Panel by PCR , Stool     Status: None   Collection Time: 10/29/19  9:16 AM   Specimen: Stool  Result Value Ref Range Status   Campylobacter species NOT DETECTED NOT DETECTED Final   Plesimonas shigelloides NOT DETECTED NOT DETECTED Final   Salmonella species NOT DETECTED NOT DETECTED Final   Yersinia enterocolitica NOT DETECTED NOT DETECTED Final   Vibrio species NOT DETECTED NOT DETECTED Final   Vibrio cholerae NOT DETECTED NOT DETECTED Final   Enteroaggregative E coli (EAEC) NOT DETECTED NOT DETECTED Final   Enteropathogenic E coli (EPEC) NOT DETECTED NOT DETECTED Final   Enterotoxigenic E  coli (ETEC) NOT DETECTED NOT DETECTED Final   Shiga like toxin producing E coli (STEC) NOT DETECTED NOT DETECTED Final   Shigella/Enteroinvasive E coli (EIEC) NOT DETECTED NOT DETECTED Final   Cryptosporidium NOT DETECTED NOT  DETECTED Final   Cyclospora cayetanensis NOT DETECTED NOT DETECTED Final   Entamoeba histolytica NOT DETECTED NOT DETECTED Final   Giardia lamblia NOT DETECTED NOT DETECTED Final   Adenovirus F40/41 NOT DETECTED NOT DETECTED Final   Astrovirus NOT DETECTED NOT DETECTED Final   Norovirus GI/GII NOT DETECTED NOT DETECTED Final   Rotavirus A NOT DETECTED NOT DETECTED Final   Sapovirus (I, II, IV, and V) NOT DETECTED NOT DETECTED Final    Comment: Performed at [facility], [address] [place], [place] [address]  C Difficile Quick Screen w PCR reflex     Status: None   Collection Time: 10/29/19  9:16 AM   Specimen: Stool  Result Value Ref Range Status   C Diff antigen NEGATIVE NEGATIVE Final   C Diff toxin NEGATIVE NEGATIVE Final   C Diff interpretation No C. difficile detected.  Final    Comment: Performed at [facility], [place] [address] [place], [place] [address]  Culture, Urine     Status: None   Collection Time: 10/30/19 12:11 PM   Specimen: Urine, Random  Result Value Ref Range Status   Specimen Description   Final    URINE, RANDOM Performed at [place] [address] [place], [place] [address]    Special Requests   Final    NONE Performed at [facility], [place] [address] [place], [place] [address]    Culture   Final    NO GROWTH Performed at [facility], [place] [address], [place],  [address]    Report Status 10/31/2019 FINAL  Final     Time coordinating discharge: 25 minutes  SIGNED: Antonieta Pert, MD  [facility] 11/03/2019, 11:23 AM  If 7PM-7AM, please contact night-coverage [email or web address]

## 2019-11-02 ENCOUNTER — Other Ambulatory Visit: Payer: Self-pay

## 2019-11-02 NOTE — Progress Notes (Signed)
PROGRESS NOTE    Mary Frey  JSH:[id number] DOB: 11/03/1975 DOA: 10/28/2019 PCP: Patient, No Pcp Per   Chef Complaints: Diarrhea  Brief Narrative: 44 year old female with liver disease anemia chronic back pain fibroids, Graves' disease, hypertension history of substance abuse systolic heart failure tobacco abuse presented with not feeling good since her discharge on 2 July when she left without completing her work-up for liver dysfunction, refused liver biopsy.  As per review of records patient was trying to obtain her lab results from her new PCP office they wanted to see her in the office first but patient had difficulties getting them and stated that she did not want to be seen in the past. They advised patient if she is not feeling well to present back to emergency department which she did Patient states she has been having diarrhea for the past 2 months no abdominal pain no nausea no vomiting no fever Patient recently was diagnosed with possible hepatitis of unclear etiology felt to be drug-induced. Work-up was inconclusive. She is supposed to undergo liver biopsy tomorrow she has not had any fevers or nausea no black stools no melena but she does endorse diarrhea.  Denies EtOh abuse,, states she quit smoking a long time ago, denies using acetaminophen,  States her liver failure came from toxins in the air or at her job when she worked History significant for patient developing Graves' disease she stopped taking her methimazole in December 2020 and then had hyperthyroidism and was restarted on her methimazole she came to the emergency department originally with abdominal pain and diarrhea and was found to have hepatitis initial thought to be secondary to methimazole toxicity she [facility] to [facility] and [facility] liver biopsy patient refused and left AMA on the second for July plan was for her to have biopsy done as an outpatient.  During her prior  hospitalization 21st and 29 June she had unremarkable right upper quadrant ultrasound. Work-up so far included.Acute hepatitis serology negative. AMA, ASMA negative. IgM EBV and IgM CMV negative. IgM HSV 1 +veinitially ID felt that HSV hepatitis was a possibility GI recommended liver biopsy to confirm as well as HSV PCR and HCV PCR On 1 July patient was seen by interventional radiology but refused to sign a consent. Patient hospital stay was complicated by the patient refusing labs CT scan of the abdomen no HIV testing.  She was found to have positive ANA and SSA antibody but SSA titer mildly elevated and double-stranded DNA was negative she was supposed to follow-up as an outpatient rheumatology  Patient was admitted with intention for liver biopsy and further GI consultation/work-up. Patient underwent liver biopsy by IR 7/9  Subjective:  Itching is better. Refused blood work today. Some nausea Diarrhea some better  Assessment & Plan:  Abnormal LFTs,significantly elevated bilirubin, unclear etiology: There was concern this could be related to recent methimazole use-has been discontinued but total bili continues to rise.Patient had extensive evaluation with serology blood work GI eval and biopsy of the liver was recommended eventually which she declined and finally left the hospital.Was supposed to see GI as outpatientStatus post IR biopsy 7/9 pt/inr stable, patient refused labs today again despite extensive counselling. Started on Cholestyramine BID. On benadryl/atarax PRN.  Appreciate GI input Dr. Cristina Gong has ordered CMP in the morning.   Discussed W/ GI today.  Diarrhea:Likely from her hyperthyroidism/high bilirubin.[place]. difficile and GI panel.Continue Lomotil. also cholestyramine added diarrhea seems to be somewhat better..   Hyperthyroidism on methimazole  which is on hold given concern for toxicity. TSH less than 0.01 T4 4.4.  She will need to follow-up with  endocrinologist, referral/phone number providedcontinue atenolol, she has no obvious or has minimal tremor and has no tachycardia.  Weight loss likely from Graves' disease?Marland Kitchen  Hypertension:Blood pressure stable on atenolol.  Tobacco use disorder in remission  Possible protein calorie malnutrition augment nutrition  Chronic microcytic anemia: Fairly stable B12, iron level and folate.  No signs of active bleeding.  Monitor H&H intermittently.  Refused LFTs and PT/INR.  DVT prophylaxis: SCDs Start: 10/29/19 0207 Code Status: FULL Family Communication: plan of care discussed with patient at bedside.  Status is: Inpatient Remains inpatient appropriate because:DUE TO Ongoing severe itching and trying new medication regimen Dispo: The patient is from: Home              Anticipated d/c is to: Home              Anticipated d/c date is: 1 day              Patient currently is not medically stable to d/c.  Anticipating discharge tomorrow if okay with GI after doing LFTs, biopsies pending. Nutrition: Diet Order            Diet regular Room service appropriate? Yes; Fluid consistency: Thin  Diet effective now                 Nutrition Problem: Increased nutrient needs Etiology: chronic illness (HF) Signs/Symptoms: estimated needs Interventions: Ensure Enlive (each supplement provides 350kcal and 20 grams of protein) Body mass index is 19.08 kg/m.  Consultants:GI Procedures:see note Microbiology:see note  Medications: Scheduled Meds: . atenolol  12.5 mg Oral BID  . cholestyramine light  4 g Oral 2 times per day  . feeding supplement (ENSURE ENLIVE)  237 mL Oral BID BM  . ferrous sulfate  325 mg Oral Daily  . melatonin  5 mg Oral QHS  . multivitamin with minerals   Oral Daily  . sodium chloride flush  3 mL Intravenous Q12H   Continuous Infusions:  Antimicrobials: Anti-infectives (From admission, onward)   None       Objective: Vitals: Today's Vitals   11/01/19  2040 11/02/19 0720 11/02/19 0739 11/02/19 1346  BP: 128/72 140/78 119/89 118/70  Pulse: 64 74 69 (!) 56  Resp: 20 20  19   Temp: 98.5 F (36.9 C) 98.5 F (36.9 C) 98.1 F (36.7 C) 98.9 F (37.2 C)  TempSrc:   Oral Oral  SpO2: 100% 96% 100% 100%  Weight:      Height:      PainSc:   0-No pain     Intake/Output Summary (Last 24 hours) at 11/02/2019 1448 Last data filed at 11/02/2019 0500 Gross per 24 hour  Intake 360 ml  Output --  Net 360 ml   Filed Weights   10/30/19 0658  Weight: 52 kg   Weight change:    Intake/Output from previous day: 07/12 0701 - 07/13 0700 In: 720 [P.O.:720] Out: -  Intake/Output this shift: No intake/output data recorded.  Examination:  General exam: AAOx3 , NAD, weak appearing. HEENT:Oral mucosa moist, Ear/Nose WNL grossly, dentition normal. Icterus present. Respiratory system: bilaterally clear,no wheezing or crackles,no use of accessory muscle Cardiovascular system: S1 & S2 +, No JVD,. Gastrointestinal system: Abdomen soft, NT,ND, BS+ Nervous System:Alert, awake, moving extremities and grossly nonfocal Extremities: No edema, distal peripheral pulses palpable.  Skin: No rashes,no icterus. MSK: Normal muscle bulk,tone, power.  Data Reviewed: I have personally reviewed following labs and imaging studies CBC: Recent Labs  Lab 10/28/19 2022 10/29/19 0500 10/30/19 1201 10/31/19 0739  WBC 9.8 8.2 8.7 7.6  NEUTROABS  --  5.4  --   --   HGB 11.5* 10.0* 11.2* 10.5*  HCT 31.6* 27.2* 30.4* 28.5*  MCV 71.7* 70.6* 71.5* 71.3*  PLT 437* 387 397 993   Basic Metabolic Panel: Recent Labs  Lab 10/28/19 2022 10/29/19 0500 10/30/19 1201 10/31/19 0739  NA 138 138 137 140  K 4.0 3.2* 3.8 4.2  CL 104 106 102 101  CO2 23 24 25 27   GLUCOSE 107* 115* 90 90  BUN 10 9 12 16   CREATININE <0.30* <0.30* <0.30* <0.30*  CALCIUM 10.4* 9.7 10.0 10.0  MG  --  1.7  --   --   PHOS  --  4.0  --   --    GFR: CrCl cannot be calculated (This lab value  cannot be used to calculate CrCl because it is not a number: <0.30). Liver Function Tests: Recent Labs  Lab 10/28/19 2022 10/29/19 0500 10/30/19 1201 10/31/19 0739  AST 747* 632* 738* 658*  ALT 528* 453* 491* 390*  ALKPHOS 219* 179* 197* 182*  BILITOT 30.0* 25.5*  25.4* 30.1* 26.0*  PROT 8.5* 7.0 8.2* 7.7  ALBUMIN 3.2* 2.6* 2.9* 2.7*   Recent Labs  Lab 10/28/19 2022  LIPASE 34   Recent Labs  Lab 10/29/19 0025  AMMONIA 34   Coagulation Profile: Recent Labs  Lab 10/28/19 2230 10/30/19 1201 10/31/19 0739  INR 1.1 1.1 1.2   Cardiac Enzymes: Recent Labs  Lab 10/29/19 0500  CKTOTAL 28*   BNP (last 3 results) No results for input(s): PROBNP in the last 8760 hours. HbA1C: No results for input(s): HGBA1C in the last 72 hours. CBG: No results for input(s): GLUCAP in the last 168 hours. Lipid Profile: No results for input(s): CHOL, HDL, LDLCALC, TRIG, CHOLHDL, LDLDIRECT in the last 72 hours. Thyroid Function Tests: No results for input(s): TSH, T4TOTAL, FREET4, T3FREE, THYROIDAB in the last 72 hours. Anemia Panel: No results for input(s): VITAMINB12, FOLATE, FERRITIN, TIBC, IRON, RETICCTPCT in the last 72 hours. Sepsis Labs: No results for input(s): PROCALCITON, LATICACIDVEN in the last 168 hours.  Recent Results (from the past 240 hour(s))  SARS Coronavirus 2 by RT PCR (hospital order, performed in [facility] hospital lab) Nasopharyngeal Nasopharyngeal Swab     Status: None   Collection Time: 10/29/19 12:07 AM   Specimen: Nasopharyngeal Swab  Result Value Ref Range Status   SARS Coronavirus 2 NEGATIVE NEGATIVE Final    Comment: (NOTE) SARS-CoV-2 target nucleic acids are NOT DETECTED.  The SARS-CoV-2 RNA is generally detectable in upper and lower respiratory specimens during the acute phase of infection. The lowest concentration of SARS-CoV-2 viral copies this assay can detect is 250 copies / mL. A negative result does not preclude SARS-CoV-2 infection and  should not be used as the sole basis for treatment or other patient management decisions.  A negative result may occur with improper specimen collection / handling, submission of specimen other than nasopharyngeal swab, presence of viral mutation(s) within the areas targeted by this assay, and inadequate number of viral copies (<250 copies / mL). A negative result must be combined with clinical observations, patient history, and epidemiological information.  Fact Sheet for Patients:   StrictlyIdeas.no  Fact Sheet for Healthcare Providers: BankingDealers.co.za  This test is not yet approved or  cleared by the [place]  States FDA and has been authorized for detection and/or diagnosis of SARS-CoV-2 by FDA under an Emergency Use Authorization (EUA).  This EUA will remain in effect (meaning this test can be used) for the duration of the COVID-19 declaration under Section 564(b)(1) of the Act, 21 U.S.C. section 360bbb-3(b)(1), unless the authorization is terminated or revoked sooner.  Performed at [facility], [place] [address] [place], [place] [address]   Gastrointestinal Panel by PCR , Stool     Status: None   Collection Time: 10/29/19  9:16 AM   Specimen: Stool  Result Value Ref Range Status   Campylobacter species NOT DETECTED NOT DETECTED Final   Plesimonas shigelloides NOT DETECTED NOT DETECTED Final   Salmonella species NOT DETECTED NOT DETECTED Final   Yersinia enterocolitica NOT DETECTED NOT DETECTED Final   Vibrio species NOT DETECTED NOT DETECTED Final   Vibrio cholerae NOT DETECTED NOT DETECTED Final   Enteroaggregative E coli (EAEC) NOT DETECTED NOT DETECTED Final   Enteropathogenic E coli (EPEC) NOT DETECTED NOT DETECTED Final   Enterotoxigenic E coli (ETEC) NOT DETECTED NOT DETECTED Final   Shiga like toxin producing E coli (STEC) NOT DETECTED NOT DETECTED Final   Shigella/Enteroinvasive E coli (EIEC) NOT  DETECTED NOT DETECTED Final   Cryptosporidium NOT DETECTED NOT DETECTED Final   Cyclospora cayetanensis NOT DETECTED NOT DETECTED Final   Entamoeba histolytica NOT DETECTED NOT DETECTED Final   Giardia lamblia NOT DETECTED NOT DETECTED Final   Adenovirus F40/41 NOT DETECTED NOT DETECTED Final   Astrovirus NOT DETECTED NOT DETECTED Final   Norovirus GI/GII NOT DETECTED NOT DETECTED Final   Rotavirus A NOT DETECTED NOT DETECTED Final   Sapovirus (I, II, IV, and V) NOT DETECTED NOT DETECTED Final    Comment: Performed at [facility], [address] [place], [place] [address]  C Difficile Quick Screen w PCR reflex     Status: None   Collection Time: 10/29/19  9:16 AM   Specimen: Stool  Result Value Ref Range Status   C Diff antigen NEGATIVE NEGATIVE Final   C Diff toxin NEGATIVE NEGATIVE Final   C Diff interpretation No C. difficile detected.  Final    Comment: Performed at [facility], [place] [address], [place], [place] [address]  Culture, Urine     Status: None   Collection Time: 10/30/19 12:11 PM   Specimen: Urine, Random  Result Value Ref Range Status   Specimen Description   Final    URINE, RANDOM Performed at [place] [address], [place], [place] [address]    Special Requests   Final    NONE Performed at [facility], [place] [address] [place], [place] [address]    Culture   Final    NO GROWTH Performed at [facility], [place] [address], [place], [place] [address]    Report Status 10/31/2019 FINAL  Final      Radiology Studies: No results found.   LOS: 4 days   Antonieta Pert, MD [facility]  11/02/2019, 2:48 PM

## 2019-11-02 NOTE — Progress Notes (Signed)
[facility] Gastroenterology Progress Note  Mary Frey 44 y.o. 1975-12-19  CC:  Transaminitis/hyperbilirubinemia  Subjective: Patient reports marked improvement in pruritus after starting cholestyramine.  Currently denies any itching.  Denies any abdominal pain.  States her diarrhea is also improving on cholestyramine, and her urine appears less dark.  ROS : Review of Systems  Gastrointestinal: Positive for diarrhea. Negative for abdominal pain, blood in stool, constipation, heartburn, melena, nausea and vomiting.  Skin: Positive for itching. Negative for rash.   Objective: Vital signs in last 24 hours: Vitals:   11/02/19 0720 11/02/19 0739  BP: 140/78 119/89  Pulse: 74 69  Resp: 20   Temp: 98.5 F (36.9 C) 98.1 F (36.7 C)  SpO2: 96% 100%   Physical Exam:  General:  Alert, oriented, pleasant and cooperative, no acute distress; thin; sitting in chair eating breakfast  Head:  Normocephalic, without obvious abnormality, atraumatic  Eyes:  Deep icterus, EOMs intact  Lungs:   Clear to auscultation bilaterally, respirations unlabored  Heart:  Regular rate and rhythm, S1/S2 normal  Abdomen:   Soft, non-tender, non-distended, bowel sounds active all four quadrants,  no guarding or peritoneal signs  Extremities: Extremities normal, atraumatic, no  edema  Pulses: 2+ and symmetric    Lab Results: Recent Labs    10/30/19 1201 10/31/19 0739  NA 137 140  K 3.8 4.2  CL 102 101  CO2 25 27  GLUCOSE 90 90  BUN 12 16  CREATININE <0.30* <0.30*  CALCIUM 10.0 10.0   Recent Labs    10/30/19 1201 10/31/19 0739  AST 738* 658*  ALT 491* 390*  ALKPHOS 197* 182*  BILITOT 30.1* 26.0*  PROT 8.2* 7.7  ALBUMIN 2.9* 2.7*   Recent Labs    10/30/19 1201 10/31/19 0739  WBC 8.7 7.6  HGB 11.2* 10.5*  HCT 30.4* 28.5*  MCV 71.5* 71.3*  PLT 397 362   Recent Labs    10/30/19 1201 10/31/19 0739  LABPROT 13.8 14.3  INR 1.1 1.2    Assessment: Transaminitis:unclear etiology, s/p  liver biopsy 7/9 (path pending) -T. Bili 26.0 with AST 658/ALT 390/ALP 182 on 7/11.  Patient refused labs yesterday and today, though we did discuss the importance of the labs in order to trend bilirubin and other LFTs. -Acute hepatitis panel negative 10/11/19; Hepatitis C PCR negative 10/21/19 -HSV IgM positive 10/12/19 but HSV PCR negative 10/21/19 -CMVand EBVIgM negative 10/12/19 -No deficiencies of alpha-1-antitrypsin and ceruloplasmin 10/12/19 -ASMAandAMA negative 10/15/19 -Ferritin elevated to 620, Iron elevated to 207 with saturation elevated to 86%; patient taking iron supplementation at home. -CT 10/28/19: No CT evidence for acute intra-abdominal or pelvic abnormality.  No focal liver abnormality is seen. No gallstones, gallbladder wall thickening, or biliary dilatation. -Right upper quadrant ultrasound was unremarkable 10/11/19  Graves' Disease: not currently on medication; TSH <0.010, T4 4.46 as of 7/9  Diarrhea, likely related to Graves' disease.  Improving on cholestyramine. C. Diff and GI pathogen panel negative  Weight loss, likely related to Graves' disease  Chronic microcytic anemia but no deficiency in iron, folate, or B12.  No signs of GI bleeding.  Plan: Await liver biopsy results.  Continue cholestyramine for itching: 4g BID at least 2 hours apart from other medications.    Okay to discharge from a GI standpoint.  Recommend repeat CMP prior to discharge.  Patient has follow up appointment with [facility] on 7/22.   Salley Slaughter PA-C 11/02/2019, 9:58 AM  Contact #  [phone]

## 2019-11-02 NOTE — Progress Notes (Signed)
Patient refused labs this morning. Will try again later.  

## 2019-11-03 MED ORDER — DIPHENOXYLATE-ATROPINE 2.5-0.025 MG PO TABS: 1.0000 | ORAL_TABLET | Freq: Four times a day (QID) | ORAL | 0 refills | Status: DC | PRN

## 2019-11-03 MED ORDER — HYDROXYZINE HCL 10 MG/5ML PO SYRP: 10.0000 mg | ORAL_SOLUTION | Freq: Three times a day (TID) | ORAL | 0 refills | Status: DC | PRN

## 2019-11-03 MED ORDER — GUAIFENESIN 100 MG/5ML PO SOLN
5.0000 mL | ORAL | Status: DC | PRN
  Administered 2019-11-03: 100 mg via ORAL
  Filled 2019-11-03: qty 10

## 2019-11-03 MED ORDER — CHOLESTYRAMINE LIGHT 4 G PO PACK: 4.0000 g | PACK | Freq: Two times a day (BID) | ORAL | 0 refills | Status: DC

## 2019-11-03 NOTE — Progress Notes (Addendum)
[facility] Gastroenterology Progress Note  Mary Frey 44 y.o. [birth year]  CC:  Transaminitis/hyperbilirubinemia  Subjective: Patient remains largely asymptomatic.  States her pruritis is well-controlled with cholestyramine, and her diarrhea is also improving on cholestyramine.  Denies any abdominal pain, nausea, or vomiting.  ROS : Review of Systems  Gastrointestinal: Positive for diarrhea. Negative for abdominal pain, blood in stool, constipation, heartburn, melena, nausea and vomiting.  Skin: Positive for itching. Negative for rash.   Objective: Vital signs in last 24 hours: Vitals:   11/03/19 0516 11/03/19 0709  BP: (!) 136/93 118/75  Pulse: 77 (!) 55  Resp: 18   Temp: 98.9 F (37.2 C) 98.7 F (37.1 C)  SpO2: 100% 100%   Physical Exam:  General:  Sleepy, oriented, pleasant and cooperative, no acute distress; thin  Head:  Normocephalic, without obvious abnormality, atraumatic  Eyes:  Deep icterus, EOMs intact  Lungs:   Clear to auscultation bilaterally, respirations unlabored  Heart:  Regular rate and rhythm, S1/S2 normal  Abdomen:   Soft, non-tender, non-distended, bowel sounds active all four quadrants,  no guarding or peritoneal signs  Extremities: Extremities normal, atraumatic, no  edema  Pulses: 2+ and symmetric    Lab Results: No results for input(s): NA, K, CL, CO2, GLUCOSE, BUN, CREATININE, CALCIUM, MG, PHOS in the last 72 hours. No results for input(s): AST, ALT, ALKPHOS, BILITOT, PROT, ALBUMIN in the last 72 hours. No results for input(s): WBC, NEUTROABS, HGB, HCT, MCV, PLT in the last 72 hours. No results for input(s): LABPROT, INR in the last 72 hours.  Assessment: Transaminitis:unclear etiology, s/p liver biopsy 7/9 (path pending) -T. Bili 26.0 with AST 658/ALT 390/ALP 182 on 7/11.  Patient has refused labs for the past two days. -Acute hepatitis panel negative 10/11/19; Hepatitis C PCR negative 10/21/19 -HSV IgM positive 10/12/19 but HSV PCR negative  10/21/19 -CMVand EBVIgM negative 10/12/19 -No deficiencies of alpha-1-antitrypsin and ceruloplasmin 10/12/19 -ASMAandAMA negative 10/15/19 -Ferritin elevated to 620, Iron elevated to 207 with saturation elevated to 86%; patient taking iron supplementation at home. -CT 10/28/19: No CT evidence for acute intra-abdominal or pelvic abnormality.  No focal liver abnormality is seen. No gallstones, gallbladder wall thickening, or biliary dilatation. -Right upper quadrant ultrasound was unremarkable 10/11/19  Graves' Disease: not currently on medication; TSH <0.010, T4 4.46 as of 7/9  Diarrhea, likely related to Graves' disease.  Improving on cholestyramine. C. Diff and GI pathogen panel negative  Weight loss, likely related to Graves' disease  Chronic microcytic anemia but no deficiency in iron, folate, or B12.  No signs of GI bleeding.  Plan: Continue cholestyramine for itching: 4g BID at least 2 hours apart from other medications.   Dr. Antonieta Pert and I discussed the importance of repeat LFTs with the patient.  Patient stated she wanted to have her biopsy results first.  Advised patient that while the biopsy results may provide [place] important information, the LFTs are important for [place] to determine her current liver function.  Patient initially agreed to lab draw but then refused to have labs drawn.  Since patient is asymptomatic and refuses any lab draws, there does not appear to be any reason for the patient to remain under inpatient care.  She has no abdominal pain, nausea, or vomiting.  She no signs of encephalopathy or coagulopathy.  She has stable vital signs, and is not on any IV medication.  Patient has follow up appointment with [facility] on 7/22 to discuss biopsy results.  Salley Slaughter PA-C 11/03/2019,  10:01 AM  Contact #  [phone]

## 2019-11-03 NOTE — Progress Notes (Signed)
Pt refused discharge vitals. Discharge instruction given and discharged to home.

## 2019-11-09 LAB — SURGICAL PATHOLOGY

## 2019-11-11 ENCOUNTER — Inpatient Hospital Stay: Payer: Medicaid - Out of State | Admitting: Physician Assistant

## 2019-11-16 ENCOUNTER — Telehealth: Payer: Self-pay

## 2019-11-16 ENCOUNTER — Inpatient Hospital Stay: Payer: Medicaid - Out of State | Attending: Adult Health

## 2019-11-17 NOTE — Telephone Encounter (Signed)
Error message

## 2019-12-07 ENCOUNTER — Ambulatory Visit: Payer: Medicaid - Out of State

## 2019-12-14 ENCOUNTER — Encounter: Payer: Self-pay | Admitting: Oncology

## 2019-12-14 ENCOUNTER — Inpatient Hospital Stay: Payer: Medicaid - Out of State

## 2019-12-14 ENCOUNTER — Inpatient Hospital Stay: Payer: Medicaid - Out of State | Attending: Adult Health | Admitting: Oncology

## 2019-12-14 DIAGNOSIS — D509 Iron deficiency anemia, unspecified: Secondary | ICD-10-CM

## 2019-12-14 DIAGNOSIS — F17200 Nicotine dependence, unspecified, uncomplicated: Secondary | ICD-10-CM

## 2019-12-14 DIAGNOSIS — E43 Unspecified severe protein-calorie malnutrition: Secondary | ICD-10-CM

## 2019-12-14 DIAGNOSIS — D61818 Other pancytopenia: Secondary | ICD-10-CM

## 2019-12-14 DIAGNOSIS — E538 Deficiency of other specified B group vitamins: Secondary | ICD-10-CM

## 2019-12-14 NOTE — Progress Notes (Signed)
[place]  [phone]) [phone] Fax:(336) [phone]     ID: Mary Frey DOB: [birth year]  MR#: [id number]  TKP#:[id number]  Patient Care Team: Patient, No Pcp Per as PCP - General (General Practice) Chauncey Cruel, MD OTHER MD:  CHIEF COMPLAINT: pancytopenia  CURRENT TREATMENT: B-12 injections   INTERVAL HISTORY: Mary Frey as scheduled today for follow up and treatmentof Mary Frey pancytopenia, however Mary Frey did not show.     REVIEW OF SYSTEMS: Mary Frey    HISTORY OF CURRENT ILLNESS: From the original intake note:  Mary Frey is here for consultation at the request of the emergency room, after being evaluated last night for dizziness, and was found to be mildly neutropenic, anemic, and mildly thrombocytopenic.  Mary Frey tells me that Mary Frey has been anemic as long as Mary Frey can remember, and struggles to take oral iron because it is constipating.  When Mary Frey was taking oral iron, Mary Frey notes Mary Frey didn't feel like this.    Mary Frey WBC since 2017:   Ref. Range 06/27/2015 19:07 06/22/2019 00:12 06/22/2019 01:29  WBC Latest Ref Range: 4.0 - 10.5 K/uL 5.4 1.5 (L) 1.2 (LL)   Mary Frey hemoglobin since 2013:   Ref. Range 06/24/2011 20:42 01/12/2013 02:46 01/13/2013 04:40 03/15/2015 17:53 06/27/2015 19:07 06/22/2019 00:12 06/22/2019 01:29  Hemoglobin Latest Ref Range: 12.0 - 15.0 g/dL 9.8 (L) 10.4 (L) 10.0 (L) 7.6 (L) 7.2 (L) 10.1 (L) 8.5 (L)   The only iron tests we have on record are from 12/2012 and show an iron level of 99, a ferritin of 42, and a TIBC of 243  Mary Frey platelets since 2017:   Ref. Range 06/27/2015 19:07 06/22/2019 00:12 06/22/2019 01:29  Platelets Latest Ref Range: 150 - 400 K/uL 329 87 (L) 82 (L)   The patient's subsequent history is as detailed below.   PAST MEDICAL HISTORY: Past Medical History:  Diagnosis Date   Anemia    Chronic back pain    Fibroids    Graves disease    Hypertension    Pregnancy induced hypertension     PAST SURGICAL HISTORY: Past Surgical History:    Procedure Laterality Date   IR US GUIDE BX ASP/DRAIN  10/29/2019   NO PAST SURGERIES      FAMILY HISTORY Family History  Problem Relation Age of Onset   Diabetes Maternal Aunt    Diabetes Maternal Uncle    Diabetes Maternal Grandmother    Diabetes Maternal Grandfather    Stroke Paternal Grandfather    Breast cancer Mother     GYNECOLOGIC HISTORY:  No LMP recorded. (Menstrual status: Irregular Periods). Menarche: 44 years old [place] P 3 LMP 06/21/2019, regular but heavy for 4 days Contraceptive has been on depo provera and OCP in the past in Mary Frey 40s  HRT none  Hysterectomy? no Salpingo-oophorectomy?no   SOCIAL HISTORY: (updated 06/2019) [place] notes that Mary Frey goes back and forth between Mary Frey home in [place], and a place in [place].  Mary Frey declined to give any further information about Mary Frey social history, and asked that I refer back to what is loaded in Epic. This states Mary Frey is single, has three living children, and works for [employer].    ADVANCED DIRECTIVES:    HEALTH MAINTENANCE: Social History   Tobacco Use   Smoking status: Light Tobacco Smoker    Years: 14.00    Types: Cigarettes   Smokeless tobacco: Never Used  Substance Use Topics   Alcohol use: Yes    Alcohol/week: 0.0 standard drinks    Comment: on occasion  Drug use: No    Types: Cocaine     Colonoscopy: never done (age)  PAP:  Bone density: never done (age)   Allergies  Allergen Reactions   Hydrocodone Nausea And Vomiting   Methimazole Diarrhea    Liver toxicity    Ptu [Propylthiouracil] Other (See Comments)    Liver toxicity    Latex Itching and Rash    Current Outpatient Medications  Medication Sig Dispense Refill   atenolol (TENORMIN) 25 MG tablet Take 0.5 tablets (12.5 mg total) by mouth 2 (two) times daily. 60 tablet 0   cholestyramine light (PREVALITE) 4 g packet Take 1 packet (4 g total) by mouth 2 (two) times daily. Mix with 4-6 oz liquid.  Take other meds 1 hr before  or 4-6 hr after cholestyramine. 60 packet 0   diphenoxylate-atropine (LOMOTIL) 2.5-0.025 MG tablet Take 1 tablet by mouth 4 (four) times daily as needed for up to 20 doses for diarrhea or loose stools. 20 tablet 0   FEROSUL 325 (65 Fe) MG tablet Take 325 mg by mouth daily.     ferrous sulfate 325 (65 FE) MG EC tablet Take 325 mg by mouth daily with breakfast. (Patient not taking: Reported on 10/28/2019)     hydrOXYzine (ATARAX) 10 MG/5ML syrup Take 5 mLs (10 mg total) by mouth 3 (three) times daily as needed for itching. 118 mL 0   melatonin 5 MG TABS Take 5 mg by mouth at bedtime.     Multiple Vitamins-Minerals (MULTIVITAMIN ADULTS PO) Take 1 tablet by mouth daily.     No current facility-administered medications for this visit.    OBJECTIVE:   There were no vitals filed for this visit.   There is no height or weight on file to calculate BMI.   Wt Readings from Last 3 Encounters:  10/30/19 114 lb 10.2 oz (52 kg)  10/21/19 114 lb 10.2 oz (52 kg)  07/21/19 130 lb 6.4 oz (59.1 kg)      ECOG FS:   LAB RESULTS:  CMP     Component Value Date/Time   NA 140 10/31/2019 0739   K 4.2 10/31/2019 0739   CL 101 10/31/2019 0739   CO2 27 10/31/2019 0739   GLUCOSE 90 10/31/2019 0739   BUN 16 10/31/2019 0739   CREATININE <0.30 (L) 10/31/2019 0739   CALCIUM 10.0 10/31/2019 0739   PROT 7.7 10/31/2019 0739   ALBUMIN 2.7 (L) 10/31/2019 0739   AST 658 (H) 10/31/2019 0739   ALT 390 (H) 10/31/2019 0739   ALKPHOS 182 (H) 10/31/2019 0739   BILITOT 26.0 (HH) 10/31/2019 0739   GFRNONAA NOT CALCULATED 10/31/2019 0739   GFRAA NOT CALCULATED 10/31/2019 0739    No results found for: TOTALPROTELP, ALBUMINELP, A1GS, A2GS, BETS, BETA2SER, GAMS, MSPIKE, SPEI  No results found for: KPAFRELGTCHN, LAMBDASER, KAPLAMBRATIO  Lab Results  Component Value Date   WBC 7.6 10/31/2019   NEUTROABS 5.4 10/29/2019   HGB 10.5 (L) 10/31/2019   HCT 28.5 (L) 10/31/2019   MCV 71.3 (L) 10/31/2019   PLT 362  10/31/2019      Chemistry      Component Value Date/Time   NA 140 10/31/2019 0739   K 4.2 10/31/2019 0739   CL 101 10/31/2019 0739   CO2 27 10/31/2019 0739   BUN 16 10/31/2019 0739   CREATININE <0.30 (L) 10/31/2019 0739      Component Value Date/Time   CALCIUM 10.0 10/31/2019 0739   ALKPHOS 182 (H) 10/31/2019 0739   AST  658 (H) 10/31/2019 0739   ALT 390 (H) 10/31/2019 0739   BILITOT 26.0 (HH) 10/31/2019 0739       No results found for: LABCA2  No components found for: LTJQZE092  No results for input(s): INR in the last 168 hours.  No results found for: LABCA2  No results found for: ZRA076  No results found for: AUQ333  No results found for: LKT625  No results found for: CA2729  No components found for: HGQUANT  No results found for: CEA1 / No results found for: CEA1   No results found for: AFPTUMOR  No results found for: CHROMOGRNA  No results found for: HGBA, HGBA2QUANT, HGBFQUANT, HGBSQUAN (Hemoglobinopathy evaluation)   No results found for: LDH  Lab Results  Component Value Date   IRON 207 (H) 10/29/2019   TIBC 241 (L) 10/29/2019   IRONPCTSAT 86 (H) 10/29/2019   (Iron and TIBC)  Lab Results  Component Value Date   FERRITIN 620 (H) 10/29/2019    Urinalysis    Component Value Date/Time   COLORURINE AMBER (A) 10/30/2019 1211   APPEARANCEUR HAZY (A) 10/30/2019 1211   LABSPEC 1.013 10/30/2019 1211   PHURINE 6.0 10/30/2019 1211   GLUCOSEU NEGATIVE 10/30/2019 1211   HGBUR NEGATIVE 10/30/2019 1211   BILIRUBINUR MODERATE (A) 10/30/2019 1211   KETONESUR NEGATIVE 10/30/2019 1211   PROTEINUR 30 (A) 10/30/2019 1211   UROBILINOGEN 0.2 03/30/2019 1215   NITRITE NEGATIVE 10/30/2019 1211   LEUKOCYTESUR NEGATIVE 10/30/2019 1211    STUDIES: No results found.   ASSESSMENT: 44 y.o. [place] woman with newly progressive pancytopenia.    1.  Pancytopenia:   (a) baseline lab work 06/22/2019 shows severe B12 deficiency  (b) Thrombocytopenia and  neutropenia: more recent onset since 2017  (c) B12 injections started 07/06/2019  2. Hyperthyroidism:  (a) TSH, T3, and T4 normal today in the ER  (b) will refer to endocrinology   PLAN: Mary Frey did not show for Mary Frey visit today. A follow up letter has been sent.  Mary Dad. Telisa Ohlsen, MD 12/14/19 5:16 PM Medical Oncology and Hematology [facility] [place], [place] [address] Tel. [phone]    Fax. [phone]   I, Wilburn Mylar, am acting as scribe for Dr. Virgie Dad. Mary Frey.  I, Lurline Del MD, have reviewed the above documentation for accuracy and completeness, and I agree with the above.   *Total Encounter Time as defined by the Centers for Medicare and Medicaid Services includes, in addition to the face-to-face time of a patient visit (documented in the note above) non-face-to-face time: obtaining and reviewing outside history, ordering and reviewing medications, tests or procedures, care coordination (communications with other health care professionals or caregivers) and documentation in the medical record.

## 2020-05-23 ENCOUNTER — Emergency Department (HOSPITAL_COMMUNITY): Payer: No Typology Code available for payment source

## 2020-05-23 ENCOUNTER — Encounter (HOSPITAL_COMMUNITY): Payer: Self-pay

## 2020-05-23 ENCOUNTER — Emergency Department (HOSPITAL_COMMUNITY)
Admission: EM | Admit: 2020-05-23 | Discharge: 2020-05-23 | Disposition: A | Discharge status: Home / Self Care | Payer: No Typology Code available for payment source | Attending: Emergency Medicine | Admitting: Emergency Medicine

## 2020-05-23 ENCOUNTER — Emergency Department (HOSPITAL_COMMUNITY)
Admission: EM | Admit: 2020-05-23 | Discharge: 2020-05-23 | Discharge status: Against Medical Advice | Payer: Medicaid - Out of State

## 2020-05-23 ENCOUNTER — Other Ambulatory Visit: Payer: Self-pay

## 2020-05-23 DIAGNOSIS — M546 Pain in thoracic spine: Secondary | ICD-10-CM | POA: Diagnosis not present

## 2020-05-23 DIAGNOSIS — F17210 Nicotine dependence, cigarettes, uncomplicated: Secondary | ICD-10-CM | POA: Diagnosis not present

## 2020-05-23 DIAGNOSIS — M549 Dorsalgia, unspecified: Secondary | ICD-10-CM | POA: Diagnosis not present

## 2020-05-23 DIAGNOSIS — Z91040 Latex allergy status: Secondary | ICD-10-CM | POA: Insufficient documentation

## 2020-05-23 DIAGNOSIS — Y92410 Unspecified street and highway as the place of occurrence of the external cause: Secondary | ICD-10-CM | POA: Insufficient documentation

## 2020-05-23 DIAGNOSIS — Z79899 Other long term (current) drug therapy: Secondary | ICD-10-CM | POA: Insufficient documentation

## 2020-05-23 DIAGNOSIS — I110 Hypertensive heart disease with heart failure: Secondary | ICD-10-CM | POA: Diagnosis not present

## 2020-05-23 DIAGNOSIS — R1030 Lower abdominal pain, unspecified: Secondary | ICD-10-CM | POA: Diagnosis not present

## 2020-05-23 DIAGNOSIS — I5021 Acute systolic (congestive) heart failure: Secondary | ICD-10-CM | POA: Insufficient documentation

## 2020-05-23 LAB — PREGNANCY, URINE: Preg Test, Ur: NEGATIVE

## 2020-05-23 MED ORDER — ONDANSETRON HCL 4 MG/2ML IJ SOLN
4.0000 mg | Freq: Once | INTRAMUSCULAR | Status: DC
  Filled 2020-05-23: qty 2

## 2020-05-23 MED ORDER — FENTANYL CITRATE (PF) 100 MCG/2ML IJ SOLN
50.0000 ug | Freq: Once | INTRAMUSCULAR | Status: DC
  Filled 2020-05-23: qty 2

## 2020-05-23 MED ORDER — METHOCARBAMOL 500 MG PO TABS
500.0000 mg | ORAL_TABLET | Freq: Two times a day (BID) | ORAL | 0 refills | Status: DC
Start: 2020-05-23 — End: 2021-05-13

## 2020-05-23 NOTE — ED Notes (Addendum)
This RN witnessed the PA explain to the patient the risk of misdiagnosis if CT scans and blood work were not performed. Patient states she that she understands and does not want CT scans and blood work. Patient is agreeable to X-rays of mid-lower back and a urine pregnancy test.
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
## 2020-05-23 NOTE — ED Notes (Signed)
Patient refusing IV and blood work and states that she just wants a urine pregnancy test. Patient stated that she needed water in order to provide a urine sample. Patient informed that she is NPO until her CT scans result. Patient states "If that involves radiation then I do not want the scan because there is a history of cancer in my family and I just recently received a CT scan". PA made aware.
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
## 2020-09-21 ENCOUNTER — Encounter (HOSPITAL_COMMUNITY): Payer: Self-pay | Admitting: Emergency Medicine

## 2020-09-21 ENCOUNTER — Emergency Department (HOSPITAL_COMMUNITY): Payer: PRIVATE HEALTH INSURANCE

## 2020-09-21 ENCOUNTER — Emergency Department (HOSPITAL_COMMUNITY)
Admission: EM | Admit: 2020-09-21 | Discharge: 2020-09-21 | Disposition: A | Discharge status: Home / Self Care | Payer: PRIVATE HEALTH INSURANCE | Attending: Emergency Medicine | Admitting: Emergency Medicine

## 2020-09-21 ENCOUNTER — Other Ambulatory Visit: Payer: Self-pay

## 2020-09-21 DIAGNOSIS — F17210 Nicotine dependence, cigarettes, uncomplicated: Secondary | ICD-10-CM | POA: Diagnosis not present

## 2020-09-21 DIAGNOSIS — I5021 Acute systolic (congestive) heart failure: Secondary | ICD-10-CM | POA: Insufficient documentation

## 2020-09-21 DIAGNOSIS — I110 Hypertensive heart disease with heart failure: Secondary | ICD-10-CM | POA: Insufficient documentation

## 2020-09-21 DIAGNOSIS — Y92410 Unspecified street and highway as the place of occurrence of the external cause: Secondary | ICD-10-CM | POA: Diagnosis not present

## 2020-09-21 DIAGNOSIS — Z79899 Other long term (current) drug therapy: Secondary | ICD-10-CM | POA: Diagnosis not present

## 2020-09-21 DIAGNOSIS — Z91040 Latex allergy status: Secondary | ICD-10-CM | POA: Insufficient documentation

## 2020-09-21 DIAGNOSIS — M25561 Pain in right knee: Secondary | ICD-10-CM | POA: Insufficient documentation

## 2020-09-21 DIAGNOSIS — G8929 Other chronic pain: Secondary | ICD-10-CM | POA: Insufficient documentation

## 2020-09-21 LAB — URINALYSIS, MICROSCOPIC (REFLEX): RBC / HPF: >50 RBC/hpf (ref 0–5)

## 2020-09-21 LAB — URINALYSIS, ROUTINE W REFLEX MICROSCOPIC

## 2020-09-21 LAB — PREGNANCY, URINE: Preg Test, Ur: NEGATIVE

## 2020-09-21 MED ORDER — CEPHALEXIN 500 MG PO CAPS: 500.0000 mg | ORAL_CAPSULE | Freq: Four times a day (QID) | ORAL | 0 refills | Status: DC

## 2020-09-21 MED ORDER — CEPHALEXIN 250 MG PO CAPS
1000.0000 mg | ORAL_CAPSULE | Freq: Once | ORAL | Status: AC
  Administered 2020-09-21: 1000 mg via ORAL
  Filled 2020-09-21: qty 4
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
## 2020-09-21 NOTE — ED Notes (Signed)
E-signature pad unavailable at time of pt discharge. This RN discussed discharge materials with pt and answered all pt questions. Pt stated understanding of discharge material. ? ?

## 2020-09-21 NOTE — ED Notes (Signed)
Pt given warm blankets. Pt told this RN that she is not experiencing flank pain & "it's just my time of the month."
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
## 2020-09-24 NOTE — ED Provider Notes (Signed)
[facility] Provider Note   CSN: [id number] Arrival date & time: 09/21/20  1749     History Chief Complaint  Patient presents with  . Knee Pain  . Urinary Frequency    Mary Frey is a 45 y.o. female.   Knee Pain Location:  Knee Time since incident:  11 months Injury: no   Knee location:  R knee Pain details:    Quality:  Aching and sharp   Radiates to:  Does not radiate   Severity:  Mild   Onset quality:  Gradual   Duration:  12 months   Timing:  Constant   Progression:  Worsening Chronicity:  Chronic Dislocation: no   Tetanus status:  Unknown Prior injury to area:  No Relieved by:  NSAIDs, rest, elevation, movement and ice Worsened by:  Bearing weight Associated symptoms: no back pain, no fever and no itching        Past Medical History:  Diagnosis Date  . Anemia   . Chronic back pain   . Fibroids   . Graves disease   . Hypertension   . Pregnancy induced hypertension     Patient Active Problem List   Diagnosis Date Noted  . Jaundice   . Abnormal liver function 10/30/2019  . Liver failure ([place]) 10/29/2019  . Abnormal LFTs 10/12/2019  . History of use of hepatotoxic drug 10/12/2019  . Abnormal liver enzymes 10/12/2019  . Other pancytopenia ([place]) 06/22/2019  . Cobalamin deficiency 06/22/2019  . Cocaine abuse ([place]) 01/12/2013  . Protein-calorie malnutrition, severe ([place]) 01/12/2013  . Graves disease 01/12/2013  . Acute systolic CHF (congestive heart failure) ([place]) 01/12/2013  . HTN (hypertension) 01/12/2013  . Microcytic anemia 01/12/2013  . Tobacco use disorder 01/12/2013    Past Surgical History:  Procedure Laterality Date  . IR US GUIDE BX ASP/DRAIN  10/29/2019  . NO PAST SURGERIES       OB History    Gravida  5   Para  3   Term  3   Preterm      AB  2   Living  3     SAB      IAB  1   Ectopic  1   Multiple      Live Births              Family History  Problem Relation  Age of Onset  . Diabetes Maternal Aunt   . Diabetes Maternal Uncle   . Diabetes Maternal Grandmother   . Diabetes Maternal Grandfather   . Stroke Paternal Grandfather   . Breast cancer Mother     Social History   Tobacco Use  . Smoking status: Light Tobacco Smoker    Years: 14.00    Types: Cigarettes  . Smokeless tobacco: Never Used  Substance Use Topics  . Alcohol use: Yes    Alcohol/week: 0.0 standard drinks    Comment: on occasion  . Drug use: No    Types: Cocaine    Home Medications Prior to Admission medications   Medication Sig Start Date End Date Taking? Authorizing Provider  cephALEXin (KEFLEX) 500 MG capsule Take 1 capsule (500 mg total) by mouth 4 (four) times daily. 09/21/20  Yes Kendle Turbin, Corene Cornea, MD  atenolol (TENORMIN) 25 MG tablet Take 0.5 tablets (12.5 mg total) by mouth 2 (two) times daily. 10/22/19   Ghimire, Henreitta Leber, MD  cholestyramine light (PREVALITE) 4 g packet Take 1 packet (4 g total) by mouth  2 (two) times daily. Mix with 4-6 oz liquid.  Take other meds 1 hr before or 4-6 hr after cholestyramine. 11/03/19 12/03/19  Antonieta Pert, MD  diphenoxylate-atropine (LOMOTIL) 2.5-0.025 MG tablet Take 1 tablet by mouth 4 (four) times daily as needed for up to 20 doses for diarrhea or loose stools. 11/03/19   Antonieta Pert, MD  FEROSUL 325 (65 Fe) MG tablet Take 325 mg by mouth daily. 10/25/19   [provider]  ferrous sulfate 325 (65 FE) MG EC tablet Take 325 mg by mouth daily with breakfast. Patient not taking: Reported on 10/28/2019    [provider]  hydrOXYzine (ATARAX) 10 MG/5ML syrup Take 5 mLs (10 mg total) by mouth 3 (three) times daily as needed for itching. 11/03/19   Antonieta Pert, MD  melatonin 5 MG TABS Take 5 mg by mouth at bedtime.    [provider]  methocarbamol (ROBAXIN) 500 MG tablet Take 1 tablet (500 mg total) by mouth 2 (two) times daily. 05/23/20   Volanda Napoleon, PA-C  Multiple Vitamins-Minerals (MULTIVITAMIN ADULTS PO) Take 1 tablet  by mouth daily.    [provider]  norethindrone (MICRONOR,CAMILA,ERRIN) 0.35 MG tablet Take 1 tablet (0.35 mg total) by mouth daily. Patient not taking: Reported on 06/27/2015 03/30/14 03/30/19  Lahoma Crocker, MD  propranolol (INDERAL) 20 MG tablet Take 1 tablet (20 mg total) by mouth 2 (two) times daily. Patient not taking: Reported on 06/27/2015 08/12/13 03/30/19  Philemon Kingdom, MD    Allergies    Hydrocodone, Methimazole, Ptu [propylthiouracil], and Latex  Review of Systems   Review of Systems  Constitutional: Negative for fever.  Musculoskeletal: Negative for back pain.  Skin: Negative for itching.  All other systems reviewed and are negative.   Physical Exam Updated Vital Signs BP (!) 180/98 (BP Location: Right Arm)   Pulse 68   Temp 98.6 F (37 C)   Resp 15   Ht 5\' 5"  (1.651 m)   LMP 09/21/2020   SpO2 98%   BMI 21.60 kg/m   Physical Exam Vitals and nursing note reviewed.  Constitutional:      Appearance: She is well-developed.  HENT:     Head: Normocephalic and atraumatic.     Nose: Nose normal. No congestion or rhinorrhea.  Eyes:     Pupils: Pupils are equal, round, and reactive to light.  Cardiovascular:     Rate and Rhythm: Normal rate and regular rhythm.  Pulmonary:     Effort: No respiratory distress.     Breath sounds: No stridor.  Abdominal:     General: There is no distension.  Musculoskeletal:        General: No swelling, tenderness, deformity or signs of injury. Normal range of motion.     Cervical back: Normal range of motion.     Right lower leg: No edema.     Left lower leg: No edema.  Skin:    General: Skin is warm and dry.  Neurological:     General: No focal deficit present.     Mental Status: She is alert.     ED Results / Procedures / Treatments   Labs (all labs ordered are listed, but only abnormal results are displayed) Labs Reviewed  URINALYSIS, ROUTINE W REFLEX MICROSCOPIC - Abnormal; Notable for the following  components:      Result Value   Color, Urine RED (*)    APPearance TURBID (*)    Glucose, UA   (*)  Value: TEST NOT REPORTED DUE TO COLOR INTERFERENCE OF URINE PIGMENT   Hgb urine dipstick   (*)    Value: TEST NOT REPORTED DUE TO COLOR INTERFERENCE OF URINE PIGMENT   Bilirubin Urine   (*)    Value: TEST NOT REPORTED DUE TO COLOR INTERFERENCE OF URINE PIGMENT   Ketones, ur   (*)    Value: TEST NOT REPORTED DUE TO COLOR INTERFERENCE OF URINE PIGMENT   Protein, ur   (*)    Value: TEST NOT REPORTED DUE TO COLOR INTERFERENCE OF URINE PIGMENT   Nitrite   (*)    Value: TEST NOT REPORTED DUE TO COLOR INTERFERENCE OF URINE PIGMENT   Leukocytes,Ua   (*)    Value: TEST NOT REPORTED DUE TO COLOR INTERFERENCE OF URINE PIGMENT   All other components within normal limits  URINALYSIS, MICROSCOPIC (REFLEX) - Abnormal; Notable for the following components:   Bacteria, UA RARE (*)    All other components within normal limits  PREGNANCY, URINE    EKG None  Radiology No results found.  Procedures Procedures   Medications Ordered in ED Medications  cephALEXin (KEFLEX) capsule 1,000 mg (1,000 mg Oral Given 09/21/20 2341)    ED Course  I have reviewed the triage vital signs and the nursing notes.  Pertinent labs & imaging results that were available during my care of the patient were reviewed by me and considered in my medical decision making (see chart for details).    MDM Rules/Calculators/A&P                          Acute on chronic knee pain. No new injuries. No fevers. No effusion. No indication for repeat imaging, workup or other exam in ED.   Final Clinical Impression(s) / ED Diagnoses Final diagnoses:  Acute pain of right knee    Rx / DC Orders ED Discharge Orders         Ordered    cephALEXin (KEFLEX) 500 MG capsule  4 times daily        09/21/20 2335           Ellon Marasco, Corene Cornea, MD 09/25/20 [phone]

## 2021-01-03 ENCOUNTER — Ambulatory Visit: Payer: Self-pay

## 2021-01-03 ENCOUNTER — Other Ambulatory Visit: Payer: Self-pay

## 2021-01-03 ENCOUNTER — Other Ambulatory Visit: Payer: Self-pay | Admitting: Family Medicine

## 2021-01-03 DIAGNOSIS — M25562 Pain in left knee: Secondary | ICD-10-CM

## 2021-01-03 DIAGNOSIS — G8929 Other chronic pain: Secondary | ICD-10-CM

## 2021-01-11 ENCOUNTER — Other Ambulatory Visit: Payer: Self-pay | Admitting: Family Medicine

## 2021-01-11 ENCOUNTER — Ambulatory Visit
Admission: RE | Admit: 2021-01-11 | Discharge: 2021-01-11 | Disposition: A | Discharge status: Home / Self Care | Payer: Self-pay | Source: Ambulatory Visit | Attending: Family Medicine | Admitting: Family Medicine

## 2021-01-11 ENCOUNTER — Encounter: Payer: Self-pay | Admitting: Oncology

## 2021-01-11 DIAGNOSIS — M25562 Pain in left knee: Secondary | ICD-10-CM

## 2021-05-11 ENCOUNTER — Emergency Department (HOSPITAL_COMMUNITY): Payer: Medicaid - Out of State

## 2021-05-11 ENCOUNTER — Observation Stay (HOSPITAL_COMMUNITY)
Admission: EM | Admit: 2021-05-11 | Discharge: 2021-05-12 | Disposition: A | Discharge status: Home / Self Care | Payer: Medicaid - Out of State | Source: Home / Self Care | Attending: Emergency Medicine | Admitting: Emergency Medicine

## 2021-05-11 ENCOUNTER — Encounter (HOSPITAL_COMMUNITY): Payer: Self-pay

## 2021-05-11 ENCOUNTER — Other Ambulatory Visit: Payer: Self-pay

## 2021-05-11 ENCOUNTER — Encounter: Payer: Self-pay | Admitting: Oncology

## 2021-05-11 DIAGNOSIS — E0590 Thyrotoxicosis, unspecified without thyrotoxic crisis or storm: Secondary | ICD-10-CM

## 2021-05-11 DIAGNOSIS — K7200 Acute and subacute hepatic failure without coma: Secondary | ICD-10-CM | POA: Insufficient documentation

## 2021-05-11 DIAGNOSIS — Z91040 Latex allergy status: Secondary | ICD-10-CM | POA: Insufficient documentation

## 2021-05-11 DIAGNOSIS — R9431 Abnormal electrocardiogram [ECG] [EKG]: Secondary | ICD-10-CM | POA: Insufficient documentation

## 2021-05-11 DIAGNOSIS — B179 Acute viral hepatitis, unspecified: Secondary | ICD-10-CM

## 2021-05-11 DIAGNOSIS — R7989 Other specified abnormal findings of blood chemistry: Secondary | ICD-10-CM | POA: Diagnosis present

## 2021-05-11 DIAGNOSIS — R079 Chest pain, unspecified: Secondary | ICD-10-CM

## 2021-05-11 DIAGNOSIS — Z20822 Contact with and (suspected) exposure to covid-19: Secondary | ICD-10-CM | POA: Insufficient documentation

## 2021-05-11 LAB — CBC WITH DIFFERENTIAL/PLATELET
Abs Immature Granulocytes: 0.07 10*3/uL (ref 0.00–0.07)
Basophils Absolute: 0 10*3/uL (ref 0.0–0.1)
Basophils Relative: 0 %
Eosinophils Absolute: 0.1 10*3/uL (ref 0.0–0.5)
Eosinophils Relative: 0 %
HCT: 33.6 % — ABNORMAL LOW (ref 36.0–46.0)
Hemoglobin: 12.4 g/dL (ref 12.0–15.0)
Immature Granulocytes: 0 %
Lymphocytes Relative: 11 %
Lymphs Abs: 1.7 10*3/uL (ref 0.7–4.0)
MCH: 25.3 pg — ABNORMAL LOW (ref 26.0–34.0)
MCHC: 36.9 g/dL — ABNORMAL HIGH (ref 30.0–36.0)
MCV: 68.4 fL — ABNORMAL LOW (ref 80.0–100.0)
Monocytes Absolute: 0.9 10*3/uL (ref 0.1–1.0)
Monocytes Relative: 6 %
Neutro Abs: 12.9 10*3/uL — ABNORMAL HIGH (ref 1.7–7.7)
Neutrophils Relative %: 83 %
Platelets: 410 10*3/uL — ABNORMAL HIGH (ref 150–400)
RBC: 4.91 MIL/uL (ref 3.87–5.11)
RDW: 16.9 % — ABNORMAL HIGH (ref 11.5–15.5)
WBC: 15.6 10*3/uL — ABNORMAL HIGH (ref 4.0–10.5)
nRBC: 0 % (ref 0.0–0.2)

## 2021-05-11 LAB — COMPREHENSIVE METABOLIC PANEL
ALT: 635 U/L — ABNORMAL HIGH (ref 0–44)
AST: 880 U/L — ABNORMAL HIGH (ref 15–41)
Albumin: 3.1 g/dL — ABNORMAL LOW (ref 3.5–5.0)
Alkaline Phosphatase: 203 U/L — ABNORMAL HIGH (ref 38–126)
Anion gap: 8 (ref 5–15)
BUN: 23 mg/dL — ABNORMAL HIGH (ref 6–20)
CO2: 19 mmol/L — ABNORMAL LOW (ref 22–32)
Calcium: 10.3 mg/dL (ref 8.9–10.3)
Chloride: 107 mmol/L (ref 98–111)
Creatinine, Ser: 0.46 mg/dL (ref 0.44–1.00)
GFR, Estimated: >60 mL/min (ref >60)
Glucose, Bld: 115 mg/dL — ABNORMAL HIGH (ref 70–99)
Potassium: 4.7 mmol/L (ref 3.5–5.1)
Sodium: 134 mmol/L — ABNORMAL LOW (ref 135–145)
Total Bilirubin: 28.5 mg/dL (ref 0.3–1.2)
Total Protein: 8.7 g/dL — ABNORMAL HIGH (ref 6.5–8.1)

## 2021-05-11 LAB — TSH: TSH: <0.01 u[IU]/mL — ABNORMAL LOW (ref 0.350–4.500)

## 2021-05-11 LAB — TROPONIN I (HIGH SENSITIVITY)
Troponin I (High Sensitivity): 15 ng/L (ref <18)
Troponin I (High Sensitivity): 11 ng/L (ref <18)

## 2021-05-11 LAB — ACETAMINOPHEN LEVEL: Acetaminophen (Tylenol), Serum: <10 ug/mL — ABNORMAL LOW (ref 10–30)

## 2021-05-11 LAB — PROTIME-INR
INR: 1.1 (ref 0.8–1.2)
Prothrombin Time: 14 seconds (ref 11.4–15.2)

## 2021-05-11 LAB — LIPASE, BLOOD: Lipase: 41 U/L (ref 11–51)

## 2021-05-11 MED ORDER — LACTATED RINGERS IV BOLUS
1000.0000 mL | Freq: Once | INTRAVENOUS | Status: AC
  Administered 2021-05-11: 1000 mL via INTRAVENOUS

## 2021-05-11 NOTE — ED Notes (Signed)
Patient refused to allow for second troponin to be drawn.

## 2021-05-11 NOTE — ED Triage Notes (Addendum)
Pt reports several symptoms and suspects she may be experiencing thyroid storm or liver complications. Says she has been nauseated and intermittently vomiting for the last month, losing weight, heart racing sensation for the last month. Pt has jaundices eyes that has been present x 2 week. Says she has been monitoring AST/ALT labs after medications that were toxic to liver > 1 yr ago. Planning thyroidectomy with PCP within next couple weeks.

## 2021-05-11 NOTE — ED Provider Triage Note (Cosign Needed)
Emergency Medicine Provider Triage Evaluation Note  Mary Frey , a 46 y.o. female  was evaluated in triage.  Pt complains of multiple complaints.  Patient is concerned she may be experiencing issues with thyroid storm or liver complications.  Reports she has been nauseated and intermittently vomiting with unintentional weight loss.  She has been having intermittent episodes of palpitations and heart racing sensation.  She reports she has been very fatigued.  She has had some intermittent chest pain and abdominal pain.  Reports that she has a prior history of liver issues after medication toxicity but has noticed scleral icterus over the past 2 weeks.  Also reports that she has been very itchy.  Review of Systems  Positive: Fatigue, nausea, vomiting, abdominal pain, chest pain, palpitations, pruritus Negative: Fever  Physical Exam  BP 107/75 (BP Location: Left Arm)    Pulse 91    Temp 97.6 F (36.4 C) (Oral)    Resp 16    Ht 5\' 5"  (1.651 m)    Wt 59.4 kg    SpO2 92%    BMI 21.80 kg/m  Gen:   Awake, ill-appearing Resp:  Normal effort, CTA bilaterally Cardiac: RRR MSK:   Moves extremities without difficulty  Other:  Scleral icterus noted, no focal abdominal tenderness  Medical Decision Making  Medically screening exam initiated at 8:04 PM.  Appropriate orders placed.  Mary Frey was informed that the remainder of the evaluation will be completed by another provider, this initial triage assessment does not replace that evaluation, and the importance of remaining in the ED until their evaluation is complete.  Patient will be taken back to acute bed for more immediate evaluation, lab evaluation started   Janet Berlin 05/11/21 2007

## 2021-05-11 NOTE — ED Provider Notes (Signed)
[facility] Provider Note    CSN: [id number] Arrival date & time: 05/11/21 1926  History Chief Complaint  Patient presents with   Jaundice   Palpitations    Mary Frey is a 46 y.o. female with history of hyperthyroidism/Grave's disease and nonspecific liver disease reports increasing jaundice recently with palpittions. She thinks she may be in thyroid storm. She was admitted in Jun/Jul 2021 for abnormal liver enzymes, workup including biopsy was normal and she reports she was told that it was due to taking methimazole so this was stopped. She has been following with her PCP who is in [place] and she reports liver enzymes had returned to normal. Her hyperthyroidism is being managed with atenolol only. She reports nausea, vomiting, diarrhea and weight loss. No fever. She reports she noted jaundice in her eyes returned recently. She has not been taking any tylenol. She is on cholestyramine for her cholesterol. She is scheduled for thyroidectomy/ablation soon.    Home Medications Prior to Admission medications   Medication Sig Start Date End Date Taking? Authorizing Provider  atenolol (TENORMIN) 25 MG tablet Take 0.5 tablets (12.5 mg total) by mouth 2 (two) times daily. 10/22/19  Yes Ghimire, Henreitta Leber, MD  cholestyramine light (PREVALITE) 4 g packet Take 1 packet (4 g total) by mouth 2 (two) times daily. Mix with 4-6 oz liquid.  Take other meds 1 hr before or 4-6 hr after cholestyramine. 11/03/19 05/11/22 Yes Antonieta Pert, MD  fenofibrate 54 MG tablet Take 54 mg by mouth daily. 04/26/21  Yes [provider]  FEROSUL 325 (65 Fe) MG tablet Take 325 mg by mouth daily. 10/25/19  Yes [provider]  hydrochlorothiazide (HYDRODIURIL) 25 MG tablet Take 25 mg by mouth daily. 04/12/21  Yes [provider]  hydrOXYzine (ATARAX) 25 MG tablet Take 25 mg by mouth daily as needed for itching. 04/12/21  Yes [provider]  melatonin 5 MG TABS Take 5  mg by mouth at bedtime.   Yes [provider]  methocarbamol (ROBAXIN) 500 MG tablet Take 1 tablet (500 mg total) by mouth 2 (two) times daily. Patient taking differently: Take 500 mg by mouth daily as needed for muscle spasms. 05/23/20  Yes Volanda Napoleon, PA-C  Multiple Vitamins-Minerals (MULTIVITAMIN ADULTS PO) Take 1 tablet by mouth daily.   Yes [provider]  naproxen (NAPROSYN) 500 MG tablet Take 500 mg by mouth 2 (two) times daily as needed. 05/09/21  Yes [provider]  triamcinolone cream (KENALOG) 0.1 % Apply 1 application topically 2 (two) times daily. 04/26/21  Yes [provider]  valsartan (DIOVAN) 80 MG tablet Take 80 mg by mouth daily. 04/12/21  Yes [provider]  diphenoxylate-atropine (LOMOTIL) 2.5-0.025 MG tablet Take 1 tablet by mouth 4 (four) times daily as needed for up to 20 doses for diarrhea or loose stools. Patient not taking: Reported on 05/11/2021 11/03/19   Antonieta Pert, MD  norethindrone (MICRONOR,CAMILA,ERRIN) 0.35 MG tablet Take 1 tablet (0.35 mg total) by mouth daily. Patient not taking: Reported on 06/27/2015 03/30/14 03/30/19  Lahoma Crocker, MD  propranolol (INDERAL) 20 MG tablet Take 1 tablet (20 mg total) by mouth 2 (two) times daily. Patient not taking: Reported on 06/27/2015 08/12/13 03/30/19  Philemon Kingdom, MD     Allergies    Latex, Hydrocodone, Methimazole, Ptu [propylthiouracil], and Hydrocodone-acetaminophen   Review of Systems   Review of Systems Please see HPI for pertinent positives and negatives  Physical Exam BP 118/79  Pulse 87    Temp 97.6 F (36.4 C) (Oral)    Resp (!) 22    Ht 5\' 5"  (1.651 m)    Wt 59.4 kg    SpO2 100%    BMI 21.80 kg/m   Physical Exam Vitals and nursing note reviewed.  Constitutional:      Appearance: Normal appearance.  HENT:     Head: Normocephalic and atraumatic.     Nose: Nose normal.     Mouth/Throat:     Mouth: Mucous membranes are moist.  Eyes:      General: Scleral icterus present.     Extraocular Movements: Extraocular movements intact.     Conjunctiva/sclera: Conjunctivae normal.  Cardiovascular:     Rate and Rhythm: Normal rate.  Pulmonary:     Effort: Pulmonary effort is normal.     Breath sounds: Normal breath sounds.  Abdominal:     General: Abdomen is flat.     Palpations: Abdomen is soft.     Tenderness: There is abdominal tenderness (mild diffuse). There is no guarding.  Musculoskeletal:        General: No swelling. Normal range of motion.     Cervical back: Neck supple.  Skin:    General: Skin is warm and dry.  Neurological:     General: No focal deficit present.     Mental Status: She is alert.  Psychiatric:        Mood and Affect: Mood normal.    ED Results / Procedures / Treatments   EKG EKG Interpretation  Date/Time:  Friday May 11 2021 20:00:48 EST Ventricular Rate:  86 PR Interval:  175 QRS Duration: 88 QT Interval:  350 QTC Calculation: 419 R Axis:   4 Text Interpretation: Sinus rhythm Probable left atrial enlargement Abnormal R-wave progression, early transition diffuse ST elevation, consider earlier repol vs pericarditis. Since last tracing ST changes are new Confirmed by Calvert Cantor [phone]) on 05/11/2021 9:49:06 PM  Procedures Procedures  Medications Ordered in the ED Medications  lactated ringers bolus 1,000 mL (1,000 mLs Intravenous New Bag/Given 05/11/21 2201)    Initial Impression and Plan  Patient with jaundice by exam. No vital signs abnormality to suggest thyroid storm. Will check labs, EKG. She is requesting some IVF for dehydration.   ED Course   Clinical Course as of 05/11/21 2308  Fri May 11, 2021  2153 CBC shows leukocytosis, no anemia.  CMP shows markedly elevated liver enzymes and bilirubin consistent with previous but by patient's report it had normalized in the interim.  [CS]  2156 INR is normal.  [CS]  2200 Discussed with the patient the recurrence of her  abnormal liver enzymes and her EKG with concerns for pericarditis. I recommended admission for further management and she has agreed to proceed. Hospitalist paged.  [CS]  2225 Spoke with Dr. Hal Hope, Hospitalist, who requests APAP level, Hepatitis panel, Free T4 level be added and he will come evaluate for admission.  [CS]  2239 Patient now not sure she wants to be admitted. She has googled medications to take for liver failure and is asking for acetylcysteine. I explained that absent a definite cause of her abnormal liver tests that no medications would be prescribed. She wants some time to think about what she wants to do.  [CS]  2306 Patient has agreed to be admitted.  [CS]  2306 TSH is undetectable. APAP is not elevated.  [CS]    Clinical Course User Index [CS] Truddie Hidden, MD  MDM Rules/Calculators/A&P Medical Decision Making Problems Addressed: Acute hepatitis: acute illness or injury that poses a threat to life or bodily functions Hyperthyroidism: acute illness or injury that poses a threat to life or bodily functions  Amount and/or Complexity of Data Reviewed External Data Reviewed: labs. Labs: ordered. Decision-making details documented in ED Course. Radiology: ordered and independent interpretation performed. Decision-making details documented in ED Course. ECG/medicine tests: ordered and independent interpretation performed. Decision-making details documented in ED Course.  Risk Decision regarding hospitalization.    Final Clinical Impression(s) / ED Diagnoses Final diagnoses:  Acute hepatitis  Hyperthyroidism  Abnormal EKG    Rx / DC Orders ED Discharge Orders     None        Truddie Hidden, MD 05/11/21 2308

## 2021-05-12 ENCOUNTER — Emergency Department (HOSPITAL_COMMUNITY): Payer: Medicaid - Out of State

## 2021-05-12 ENCOUNTER — Emergency Department (HOSPITAL_BASED_OUTPATIENT_CLINIC_OR_DEPARTMENT_OTHER): Payer: Medicaid - Out of State

## 2021-05-12 ENCOUNTER — Inpatient Hospital Stay (HOSPITAL_COMMUNITY)
Admission: EM | Admit: 2021-05-12 | Discharge: 2021-05-13 | DRG: 441 | Disposition: A | Discharge status: Home / Self Care | Payer: Medicaid - Out of State | Attending: Internal Medicine | Admitting: Internal Medicine

## 2021-05-12 ENCOUNTER — Encounter (HOSPITAL_COMMUNITY): Payer: Self-pay | Admitting: *Deleted

## 2021-05-12 DIAGNOSIS — R7401 Elevation of levels of liver transaminase levels: Secondary | ICD-10-CM | POA: Diagnosis not present

## 2021-05-12 DIAGNOSIS — K719 Toxic liver disease, unspecified: Secondary | ICD-10-CM | POA: Diagnosis not present

## 2021-05-12 DIAGNOSIS — E860 Dehydration: Secondary | ICD-10-CM | POA: Diagnosis present

## 2021-05-12 DIAGNOSIS — Z91199 Patient's noncompliance with other medical treatment and regimen due to unspecified reason: Secondary | ICD-10-CM | POA: Diagnosis not present

## 2021-05-12 DIAGNOSIS — E0500 Thyrotoxicosis with diffuse goiter without thyrotoxic crisis or storm: Secondary | ICD-10-CM | POA: Diagnosis present

## 2021-05-12 DIAGNOSIS — R079 Chest pain, unspecified: Secondary | ICD-10-CM

## 2021-05-12 DIAGNOSIS — I10 Essential (primary) hypertension: Secondary | ICD-10-CM | POA: Diagnosis present

## 2021-05-12 DIAGNOSIS — R17 Unspecified jaundice: Secondary | ICD-10-CM | POA: Diagnosis present

## 2021-05-12 DIAGNOSIS — D72828 Other elevated white blood cell count: Secondary | ICD-10-CM | POA: Diagnosis present

## 2021-05-12 DIAGNOSIS — R9431 Abnormal electrocardiogram [ECG] [EKG]: Secondary | ICD-10-CM | POA: Diagnosis present

## 2021-05-12 DIAGNOSIS — F17210 Nicotine dependence, cigarettes, uncomplicated: Secondary | ICD-10-CM | POA: Diagnosis present

## 2021-05-12 DIAGNOSIS — E0501 Thyrotoxicosis with diffuse goiter with thyrotoxic crisis or storm: Secondary | ICD-10-CM | POA: Diagnosis present

## 2021-05-12 DIAGNOSIS — Z803 Family history of malignant neoplasm of breast: Secondary | ICD-10-CM

## 2021-05-12 DIAGNOSIS — R7989 Other specified abnormal findings of blood chemistry: Secondary | ICD-10-CM | POA: Diagnosis present

## 2021-05-12 DIAGNOSIS — Z823 Family history of stroke: Secondary | ICD-10-CM

## 2021-05-12 DIAGNOSIS — E0590 Thyrotoxicosis, unspecified without thyrotoxic crisis or storm: Secondary | ICD-10-CM | POA: Diagnosis present

## 2021-05-12 DIAGNOSIS — D72829 Elevated white blood cell count, unspecified: Secondary | ICD-10-CM | POA: Diagnosis present

## 2021-05-12 DIAGNOSIS — R197 Diarrhea, unspecified: Secondary | ICD-10-CM | POA: Diagnosis present

## 2021-05-12 DIAGNOSIS — Z79899 Other long term (current) drug therapy: Secondary | ICD-10-CM | POA: Diagnosis not present

## 2021-05-12 DIAGNOSIS — T382X5A Adverse effect of antithyroid drugs, initial encounter: Secondary | ICD-10-CM | POA: Diagnosis present

## 2021-05-12 DIAGNOSIS — Z833 Family history of diabetes mellitus: Secondary | ICD-10-CM

## 2021-05-12 LAB — HEPATITIS PANEL, ACUTE
HCV Ab: NONREACTIVE
Hep A IgM: NONREACTIVE
Hep B C IgM: NONREACTIVE
Hepatitis B Surface Ag: NONREACTIVE

## 2021-05-12 LAB — ECHOCARDIOGRAM COMPLETE
AR max vel: 2.52 cm2
AV Area VTI: 2.61 cm2
AV Area mean vel: 2.34 cm2
AV Mean grad: 9 mmHg
AV Peak grad: 16.2 mmHg
Ao pk vel: 2.01 m/s
Area-P 1/2: 3.93 cm2
Height: 65 in
S' Lateral: 2.2 cm
Weight: 2096 oz

## 2021-05-12 LAB — RESP PANEL BY RT-PCR (FLU A&B, COVID) ARPGX2
Influenza A by PCR: NEGATIVE
Influenza B by PCR: NEGATIVE
SARS Coronavirus 2 by RT PCR: NEGATIVE

## 2021-05-12 LAB — CK: Total CK: 26 U/L — ABNORMAL LOW (ref 38–234)

## 2021-05-12 LAB — TROPONIN I (HIGH SENSITIVITY)
Troponin I (High Sensitivity): 13 ng/L (ref <18)
Troponin I (High Sensitivity): 11 ng/L (ref <18)

## 2021-05-12 LAB — I-STAT BETA HCG BLOOD, ED (MC, WL, AP ONLY): I-stat hCG, quantitative: <5 m[IU]/mL (ref <5)

## 2021-05-12 LAB — SEDIMENTATION RATE: Sed Rate: 64 mm/hr — ABNORMAL HIGH (ref 0–22)

## 2021-05-12 LAB — T4, FREE: Free T4: >5.5 ng/dL — ABNORMAL HIGH (ref 0.61–1.12)

## 2021-05-12 MED ORDER — LACTATED RINGERS IV SOLN: INTRAVENOUS | Status: DC

## 2021-05-12 MED ORDER — ADULT MULTIVITAMIN W/MINERALS CH
1.0000 | ORAL_TABLET | Freq: Every day | ORAL | Status: DC
  Administered 2021-05-12 – 2021-05-13 (×2): 1 via ORAL
  Filled 2021-05-12 (×2): qty 1

## 2021-05-12 MED ORDER — KETOROLAC TROMETHAMINE 30 MG/ML IJ SOLN
30.0000 mg | Freq: Once | INTRAMUSCULAR | Status: AC
  Administered 2021-05-12: 30 mg via INTRAVENOUS
  Filled 2021-05-12: qty 1

## 2021-05-12 MED ORDER — DIAZEPAM 5 MG/ML IJ SOLN
5.0000 mg | Freq: Once | INTRAMUSCULAR | Status: AC
  Administered 2021-05-12: 5 mg via INTRAVENOUS
  Filled 2021-05-12: qty 2

## 2021-05-12 MED ORDER — IOHEXOL 300 MG/ML  SOLN
100.0000 mL | Freq: Once | INTRAMUSCULAR | Status: AC | PRN
  Administered 2021-05-12: 100 mL via INTRAVENOUS

## 2021-05-12 NOTE — ED Provider Notes (Addendum)
Patient seen and examined, agree with assessment and plan by APP. Patient known to me from ED visit last night during which we had planned to admit her for hyperthyroidism, acute hepatitis and EKG concerning for early repol vs pericarditis however she had to leave to attend to a personal matter. She is back today to complete workup. Spoke with Dr. [name], Hospitalist, who will admit. He requests we discuss with Cardiology. I spoke with Dr. Johnsie Cancel, Cardiology, who has reviewed the EKG and suspects this is early repol, but recommends echo, CRP, Sed rate and if abnormal they can call Cardiology back. This recommendation was communicated to Dr. [name].   Labs from yesterday are sufficient to proceed with CT (normal renal function).      Truddie Hidden, MD 05/12/21 1327    Truddie Hidden, MD 05/12/21 1331

## 2021-05-12 NOTE — ED Triage Notes (Signed)
Pt states she was seen yesterday, abnormal Liver Enzymes and ? Thyroid problem. Pt back for recheck of labs

## 2021-05-12 NOTE — H&P (Signed)
History and Physical    DEVLYN PARISH ELF:[id number] DOB: [birth year] DOA: 05/12/2021  PCP: System, Provider Not In  Patient coming from: Home  Chief Complaint: jaundice, fatigue, diarrhea, palpitations.   HPI: BRENA WINDSOR is a 46 y.o. female with medical history significant of hyperthyroidism, chronic pain. Presenting with jaundice, diarrhea, and palpitations. She reports that she's had about 3 weeks of persistent diarrhea. During this time she had tried several OTC meds, but they have not been helpful. She has become increasingly fatigued and short of breath w/ activity during that time. She has noticed increased palpitations. She denies N/V. She came to the ED last night to be evaluated, but left AMA. She returns today with same symptoms.   She reports that she was on methimazole for her hyperthyroidism for 5 years through 10/2019. At that time, it was stopped d/t liver toxicity. She has since been only taking atenolol. She has a current recommendation and schedule for thyroidectomy/ablation with her PCP team in [place], but she questions that opinion.   ED Course: LFTs and bilirubin were elevated. CT ab/pelvis was negative. TSH was undetectable low. FT4 was elevated at >5.5. Transfer was attempted to [facility] but no beds available. TRH was called for admission.   Review of Systems: Review of systems is otherwise negative for all not mentioned in HPI.   PMHx Past Medical History:  Diagnosis Date   Anemia    Chronic back pain    Fibroids    Graves disease    Hypertension    Pregnancy induced hypertension     PSHx Past Surgical History:  Procedure Laterality Date   IR US GUIDE BX ASP/DRAIN  10/29/2019   NO PAST SURGERIES      SocHx  reports that she has been smoking cigarettes. She has never used smokeless tobacco. She reports current alcohol use. She reports that she does not use drugs.  Allergies  Allergen Reactions   Latex Itching, Rash and Other (See Comments)     Reaction Type: Allergy   Hydrocodone Nausea And Vomiting   Methimazole Diarrhea    Liver toxicity    Ptu [Propylthiouracil] Other (See Comments)    Liver toxicity    Hydrocodone-Acetaminophen Other (See Comments)    Reaction Type: Allergy    FamHx Family History  Problem Relation Age of Onset   Diabetes Maternal Aunt    Diabetes Maternal Uncle    Diabetes Maternal Grandmother    Diabetes Maternal Grandfather    Stroke Paternal Grandfather    Breast cancer Mother     Prior to Admission medications   Medication Sig Start Date End Date Taking? Authorizing Provider  atenolol (TENORMIN) 25 MG tablet Take 0.5 tablets (12.5 mg total) by mouth 2 (two) times daily. 10/22/19   Ghimire, Henreitta Leber, MD  cholestyramine light (PREVALITE) 4 g packet Take 1 packet (4 g total) by mouth 2 (two) times daily. Mix with 4-6 oz liquid.  Take other meds 1 hr before or 4-6 hr after cholestyramine. 11/03/19 05/11/22  Antonieta Pert, MD  diphenoxylate-atropine (LOMOTIL) 2.5-0.025 MG tablet Take 1 tablet by mouth 4 (four) times daily as needed for up to 20 doses for diarrhea or loose stools. Patient not taking: Reported on 05/11/2021 11/03/19   Antonieta Pert, MD  fenofibrate 54 MG tablet Take 54 mg by mouth daily. 04/26/21   [provider]  FEROSUL 325 (65 Fe) MG tablet Take 325 mg by mouth daily. 10/25/19   [provider]  hydrochlorothiazide (HYDRODIURIL) 25  MG tablet Take 25 mg by mouth daily. 04/12/21   [provider]  hydrOXYzine (ATARAX) 25 MG tablet Take 25 mg by mouth daily as needed for itching. 04/12/21   [provider]  melatonin 5 MG TABS Take 5 mg by mouth at bedtime.    [provider]  methocarbamol (ROBAXIN) 500 MG tablet Take 1 tablet (500 mg total) by mouth 2 (two) times daily. Patient taking differently: Take 500 mg by mouth daily as needed for muscle spasms. 05/23/20   Volanda Napoleon, PA-C  Multiple Vitamins-Minerals (MULTIVITAMIN ADULTS PO) Take 1 tablet  by mouth daily.    [provider]  naproxen (NAPROSYN) 500 MG tablet Take 500 mg by mouth 2 (two) times daily as needed. 05/09/21   [provider]  triamcinolone cream (KENALOG) 0.1 % Apply 1 application topically 2 (two) times daily. 04/26/21   [provider]  valsartan (DIOVAN) 80 MG tablet Take 80 mg by mouth daily. 04/12/21   [provider]  norethindrone (MICRONOR,CAMILA,ERRIN) 0.35 MG tablet Take 1 tablet (0.35 mg total) by mouth daily. Patient not taking: Reported on 06/27/2015 03/30/14 03/30/19  Lahoma Crocker, MD  propranolol (INDERAL) 20 MG tablet Take 1 tablet (20 mg total) by mouth 2 (two) times daily. Patient not taking: Reported on 06/27/2015 08/12/13 03/30/19  Philemon Kingdom, MD    Physical Exam: Vitals:   05/12/21 1202 05/12/21 1230 05/12/21 1350 05/12/21 1400  BP:  114/64 112/73 109/66  Pulse:  (!) 103 97 87  Resp:  16 16 16   Temp:      TempSrc:      SpO2:  98% 100% 100%  Weight: 59.4 kg     Height: 5' 5"  (1.651 m)       General: 46 y.o. female resting in bed in NAD Eyes: PERRL, icteric sclera ENMT: Nares patent w/o discharge, orophaynx clear, dentition normal, ears w/o discharge/lesions/ulcers; she is jaundiced, dry mucousa Neck: Supple, trachea midline Cardiovascular: RRR, +S1, S2, no m/g/r, equal pulses throughout Respiratory: CTABL, no w/r/r, normal WOB GI: BS+, NDNT, no masses noted, no organomegaly noted MSK: No e/c/c Neuro: A&O x 3, no focal deficits Psyc: Appropriate interaction and affect, calm/cooperative  Labs on Admission: I have personally reviewed following labs and imaging studies  CBC: Recent Labs  Lab 05/11/21 2023  WBC 15.6*  NEUTROABS 12.9*  HGB 12.4  HCT 33.6*  MCV 68.4*  PLT 675*   Basic Metabolic Panel: Recent Labs  Lab 05/11/21 2023  NA 134*  K 4.7  CL 107  CO2 19*  GLUCOSE 115*  BUN 23*  CREATININE 0.46  CALCIUM 10.3   GFR: Estimated Creatinine Clearance: 81.6 mL/min (by C-G  formula based on SCr of 0.46 mg/dL). Liver Function Tests: Recent Labs  Lab 05/11/21 2023  AST 880*  ALT 635*  ALKPHOS 203*  BILITOT 28.5*  PROT 8.7*  ALBUMIN 3.1*   Recent Labs  Lab 05/11/21 2023  LIPASE 41   No results for input(s): AMMONIA in the last 168 hours. Coagulation Profile: Recent Labs  Lab 05/11/21 2023  INR 1.1   Cardiac Enzymes: Recent Labs  Lab 05/12/21 1328  CKTOTAL 26*   BNP (last 3 results) No results for input(s): PROBNP in the last 8760 hours. HbA1C: No results for input(s): HGBA1C in the last 72 hours. CBG: No results for input(s): GLUCAP in the last 168 hours. Lipid Profile: No results for input(s): CHOL, HDL, LDLCALC, TRIG, CHOLHDL, LDLDIRECT in the last 72 hours. Thyroid Function Tests:  Recent Labs    05/11/21 2023  TSH <0.010*  FREET4 >5.50*   Anemia Panel: No results for input(s): VITAMINB12, FOLATE, FERRITIN, TIBC, IRON, RETICCTPCT in the last 72 hours. Urine analysis:    Component Value Date/Time   COLORURINE RED (A) 09/21/2020 1942   APPEARANCEUR TURBID (A) 09/21/2020 1942   LABSPEC  09/21/2020 1942    TEST NOT REPORTED DUE TO COLOR INTERFERENCE OF URINE PIGMENT   PHURINE  09/21/2020 1942    TEST NOT REPORTED DUE TO COLOR INTERFERENCE OF URINE PIGMENT   GLUCOSEU (A) 09/21/2020 1942    TEST NOT REPORTED DUE TO COLOR INTERFERENCE OF URINE PIGMENT   HGBUR (A) 09/21/2020 1942    TEST NOT REPORTED DUE TO COLOR INTERFERENCE OF URINE PIGMENT   BILIRUBINUR (A) 09/21/2020 1942    TEST NOT REPORTED DUE TO COLOR INTERFERENCE OF URINE PIGMENT   KETONESUR (A) 09/21/2020 1942    TEST NOT REPORTED DUE TO COLOR INTERFERENCE OF URINE PIGMENT   PROTEINUR (A) 09/21/2020 1942    TEST NOT REPORTED DUE TO COLOR INTERFERENCE OF URINE PIGMENT   UROBILINOGEN 0.2 03/30/2019 1215   NITRITE (A) 09/21/2020 1942    TEST NOT REPORTED DUE TO COLOR INTERFERENCE OF URINE PIGMENT   LEUKOCYTESUR (A) 09/21/2020 1942    TEST NOT REPORTED DUE TO COLOR  INTERFERENCE OF URINE PIGMENT    Radiological Exams on Admission: DG Chest Port 1 View  Result Date: 05/11/2021 CLINICAL DATA:  Nausea and vomiting. EXAM: PORTABLE CHEST 1 VIEW COMPARISON:  Chest x-ray 03/15/2015. FINDINGS: The heart size and mediastinal contours are within normal limits. Both lungs are clear. The visualized skeletal structures are unremarkable. IMPRESSION: No active disease. Electronically Signed   By: Ronney Asters M.D.   On: 05/11/2021 20:18   ECHOCARDIOGRAM COMPLETE  Result Date: 05/12/2021    ECHOCARDIOGRAM REPORT   Patient Name:   RAHMA MELLER Prentiss Date of Exam: 05/12/2021 Medical Rec #:  [id number]        Height:       65.0 in Accession #:    [id number]       Weight:       131.0 lb Date of Birth:  [birth year]         BSA:          1.653 m Patient Age:    15 years         BP:           112/73 mmHg Patient Gender: F                HR:           99 bpm. Exam Location:  Inpatient Procedure: 2D Echo, Cardiac Doppler and Color Doppler Indications:    Chest Pain R07.9  History:        Patient has prior history of Echocardiogram examinations, most                 recent 01/12/2013. Signs/Symptoms:Chest Pain. Thyroid disease.                 Liver disease.  Sonographer:    Merrie Roof RDCS Referring Phys: [id number] [place]  1. Left ventricular ejection fraction, by estimation, is 60 to 65%. The left ventricle has normal function. The left ventricle has no regional wall motion abnormalities. Left ventricular diastolic parameters were normal.  2. Right ventricular systolic function is normal. The right ventricular size is normal.  3. The mitral valve is abnormal.  Mild mitral valve regurgitation. No evidence of mitral stenosis.  4. Small gradient across LVOT/AV no obvious sub aortic web and valve appears to open well there is some chordal SAM and hyperdynamic EF which may contribute . The aortic valve is tricuspid. Aortic valve regurgitation is not visualized. No aortic stenosis is  present.  5. The inferior vena cava is normal in size with greater than 50% respiratory variability, suggesting right atrial pressure of 3 mmHg. FINDINGS  Left Ventricle: Left ventricular ejection fraction, by estimation, is 60 to 65%. The left ventricle has normal function. The left ventricle has no regional wall motion abnormalities. The left ventricular internal cavity size was normal in size. There is  no left ventricular hypertrophy. Left ventricular diastolic parameters were normal. Right Ventricle: The right ventricular size is normal. No increase in right ventricular wall thickness. Right ventricular systolic function is normal. Left Atrium: Left atrial size was normal in size. Right Atrium: Right atrial size was normal in size. Pericardium: There is no evidence of pericardial effusion. Mitral Valve: The mitral valve is abnormal. There is mild thickening of the mitral valve leaflet(s). There is mild calcification of the mitral valve leaflet(s). Mild mitral annular calcification. Mild mitral valve regurgitation. No evidence of mitral valve stenosis. Tricuspid Valve: The tricuspid valve is normal in structure. Tricuspid valve regurgitation is mild . No evidence of tricuspid stenosis. Aortic Valve: Small gradient across LVOT/AV no obvious sub aortic web and valve appears to open well there is some chordal SAM and hyperdynamic EF which may contribute. The aortic valve is tricuspid. Aortic valve regurgitation is not visualized. No aortic stenosis is present. Aortic valve mean gradient measures 9.0 mmHg. Aortic valve peak gradient measures 16.2 mmHg. Aortic valve area, by VTI measures 2.61 cm. Pulmonic Valve: The pulmonic valve was normal in structure. Pulmonic valve regurgitation is mild. No evidence of pulmonic stenosis. Aorta: The aortic root is normal in size and structure. Venous: The inferior vena cava is normal in size with greater than 50% respiratory variability, suggesting right atrial pressure of 3  mmHg. IAS/Shunts: No atrial level shunt detected by color flow Doppler.  LEFT VENTRICLE PLAX 2D LVIDd:         3.40 cm   Diastology LVIDs:         2.20 cm   LV e' medial:    12.10 cm/s LV PW:         1.00 cm   LV E/e' medial:  6.4 LV IVS:        1.00 cm   LV e' lateral:   9.68 cm/s LVOT diam:     2.00 cm   LV E/e' lateral: 8.0 LV SV:         77 LV SV Index:   47 LVOT Area:     3.14 cm  RIGHT VENTRICLE RV Basal diam:  3.40 cm LEFT ATRIUM             Index        RIGHT ATRIUM           Index LA diam:        3.40 cm 2.06 cm/m   RA Area:     14.30 cm LA Vol (A2C):   37.9 ml 22.93 ml/m  RA Volume:   40.70 ml  24.63 ml/m LA Vol (A4C):   48.7 ml 29.47 ml/m LA Biplane Vol: 47.3 ml 28.62 ml/m  AORTIC VALVE AV Area (Vmax):    2.52 cm AV Area (Vmean):  2.34 cm AV Area (VTI):     2.61 cm AV Vmax:           201.00 cm/s AV Vmean:          138.000 cm/s AV VTI:            0.296 m AV Peak Grad:      16.2 mmHg AV Mean Grad:      9.0 mmHg LVOT Vmax:         161.00 cm/s LVOT Vmean:        103.000 cm/s LVOT VTI:          0.246 m LVOT/AV VTI ratio: 0.83  AORTA Ao Root diam: 2.70 cm Ao Asc diam:  2.80 cm MITRAL VALVE MV Area (PHT): 3.93 cm    SHUNTS MV Decel Time: 193 msec    Systemic VTI:  0.25 m MV E velocity: 77.60 cm/s  Systemic Diam: 2.00 cm MV A velocity: 75.00 cm/s MV E/A ratio:  1.03 Jenkins Rouge MD Electronically signed by Jenkins Rouge MD Signature Date/Time: 05/12/2021/2:57:32 PM    Final     EKG: Independently reviewed. Sinus, diffuse ST elevations  Assessment/Plan Diarrhea Elevated LFTs Hyperbilirubinemia Dehydration      - admit to inpt, tele      - check EBV, acute hepatitis, c diff, GI PCR      - CT is negative; check RUQ ab [place]      - fluids      - [facility] consulted, appreciate assistance  Hyperthyroidism     - known history of hyperthyroidism     - she reports she was previously on methimazole for 5 years and it causes liver toxicity; she has not been on that medication for over a year     -  she takes atenolol for symptom control     - she reports that she has had a recommendation to have her thyroid removed with a group she follows in [place]     - TSH is undetectable low; T4 is > 5.5; T3 is pending     - methimazole and PTU carry the possibility of liver toxicity; given she's already in liver failure, these would not be good options     - spoke with Endo w/ [facility] (Dr. Golden Pop): no good long term options other than definitive surgical treatment; resume beta blockade. Can consider short term steroids.   Microcytosis     - likely secondary to hyperthyroidism     - also check iron studies  Leukocytosis     - this diarrhea has been ongoing for 3 weeks and her CT is normal; will hold on abx     - check blood Cx, UA  Abnormal EKG     - she has diffuse ST elevations that were concerning for pericarditis     - cardiology was consulted by ED, said EKG looks more like early repol, check ESR/CRP, echo. If positive, reconsult     - echo is negative   DVT prophylaxis: SCDs  Code Status: FULL  Family Communication: w/ SO at bedside  Consults called: EDP spoke with cardiology; I consulted [facility] (Dr. Alessandra Bevels); I consulted End[facility]inology at [facility] (Dr. Golden Pop)   Status is: Inpatient  Remains inpatient appropriate because: severity of illness  Time spent coordinating admission: 120 minutes  [facility]  If 7PM-7AM, please contact night-coverage [email or web address]  05/12/2021, 3:12 PM

## 2021-05-12 NOTE — Progress Notes (Signed)
°  Echocardiogram 2D Echocardiogram has been performed.  Merrie Roof F 05/12/2021, 2:22 PM
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
## 2021-05-12 NOTE — ED Notes (Signed)
EDP and Hospitalist made aware of pt's intent to leave.

## 2021-05-12 NOTE — ED Notes (Signed)
ED TO INPATIENT HANDOFF REPORT  ED Nurse Name and Phone #: Erick Colace, RN [phone]  S Name/Age/Gender Mary Frey 46 y.o. female Room/Bed: RESB/RESB  Code Status   Code Status: Prior  Home/SNF/Other Home Patient oriented to: self, place, time, and situation Is this baseline? Yes   Triage Complete: Triage complete  Chief Complaint Elevated LFTs [R79.89]  Triage Note Pt reports several symptoms and suspects she may be experiencing thyroid storm or liver complications. Says she has been nauseated and intermittently vomiting for the last month, losing weight, heart racing sensation for the last month. Pt has jaundices eyes that has been present x 2 week. Says she has been monitoring AST/ALT labs after medications that were toxic to liver > 1 yr ago. Planning thyroidectomy with PCP within next couple weeks.    Allergies Allergies  Allergen Reactions   Latex Itching, Rash and Other (See Comments)    Reaction Type: Allergy   Hydrocodone Nausea And Vomiting   Methimazole Diarrhea    Liver toxicity    Ptu [Propylthiouracil] Other (See Comments)    Liver toxicity    Hydrocodone-Acetaminophen Other (See Comments)    Reaction Type: Allergy    Level of Care/Admitting Diagnosis ED Disposition     ED Disposition  Admit   Condition  --   Comment  Hospital Area: [facility] [[id number]]  Level of Care: Telemetry [5]  Admit to tele based on following criteria: Monitor for Ischemic changes  May place patient in observation at [facility] or [place] if equivalent level of care is available:: No  Covid Evaluation: Asymptomatic Screening Protocol (No Symptoms)  Diagnosis: Elevated LFTs [194174]  Admitting Physician: Rise Patience [phone]  Attending Physician: Rise Patience [email or web address]          B Medical/Surgery History Past Medical History:  Diagnosis Date   Anemia    Chronic back pain    Fibroids    Graves disease    Hypertension     Pregnancy induced hypertension    Past Surgical History:  Procedure Laterality Date   IR US GUIDE BX ASP/DRAIN  10/29/2019   NO PAST SURGERIES       A IV Location/Drains/Wounds Patient Lines/Drains/Airways Status     Active Line/Drains/Airways     Name Placement date Placement time Site Days   Peripheral IV 05/11/21 20 G 1" Anterior;Left Hand 05/11/21  2027  Hand  1            Intake/Output Last 24 hours  Intake/Output Summary (Last 24 hours) at 05/12/2021 0008 Last data filed at 05/11/2021 2348 Gross per 24 hour  Intake 1000 ml  Output --  Net 1000 ml    Labs/Imaging Results for orders placed or performed during the hospital encounter of 05/11/21 (from the past 48 hour(s))  Comprehensive metabolic panel     Status: Abnormal   Collection Time: 05/11/21  8:23 PM  Result Value Ref Range   Sodium 134 (L) 135 - 145 mmol/L   Potassium 4.7 3.5 - 5.1 mmol/L   Chloride 107 98 - 111 mmol/L   CO2 19 (L) 22 - 32 mmol/L   Glucose, Bld 115 (H) 70 - 99 mg/dL    Comment: Glucose reference range applies only to samples taken after fasting for at least 8 hours.   BUN 23 (H) 6 - 20 mg/dL   Creatinine, Ser 0.46 0.44 - 1.00 mg/dL   Calcium 10.3 8.9 - 10.3 mg/dL   Total Protein 8.7 (  H) 6.5 - 8.1 g/dL   Albumin 3.1 (L) 3.5 - 5.0 g/dL   AST 880 (H) 15 - 41 U/L   ALT 635 (H) 0 - 44 U/L   Alkaline Phosphatase 203 (H) 38 - 126 U/L   Total Bilirubin 28.5 (HH) 0.3 - 1.2 mg/dL    Comment: CRITICAL RESULT CALLED TO, READ BACK BY AND VERIFIED WITH: KATIE, RN @ 2148 ON 05/11/2021 BY LBROOKS, MLT    GFR, Estimated >60 >60 mL/min    Comment: (NOTE) Calculated using the CKD-EPI Creatinine Equation (2021)    Anion gap 8 5 - 15    Comment: Performed at [facility], [place] [address], [place], [place] [address]  Lipase, blood     Status: None   Collection Time: 05/11/21  8:23 PM  Result Value Ref Range   Lipase 41 11 - 51 U/L    Comment: Performed at [facility], [place] [address] [place], [place] [address]  CBC with Differential     Status: Abnormal   Collection Time: 05/11/21  8:23 PM  Result Value Ref Range   WBC 15.6 (H) 4.0 - 10.5 K/uL   RBC 4.91 3.87 - 5.11 MIL/uL   Hemoglobin 12.4 12.0 - 15.0 g/dL   HCT 33.6 (L) 36.0 - 46.0 %   MCV 68.4 (L) 80.0 - 100.0 fL   MCH 25.3 (L) 26.0 - 34.0 pg   MCHC 36.9 (H) 30.0 - 36.0 g/dL   RDW 16.9 (H) 11.5 - 15.5 %   Platelets 410 (H) 150 - 400 K/uL    Comment: REPEATED TO VERIFY   nRBC 0.0 0.0 - 0.2 %   Neutrophils Relative % 83 %   Neutro Abs 12.9 (H) 1.7 - 7.7 K/uL   Lymphocytes Relative 11 %   Lymphs Abs 1.7 0.7 - 4.0 K/uL   Monocytes Relative 6 %   Monocytes Absolute 0.9 0.1 - 1.0 K/uL   Eosinophils Relative 0 %   Eosinophils Absolute 0.1 0.0 - 0.5 K/uL   Basophils Relative 0 %   Basophils Absolute 0.0 0.0 - 0.1 K/uL   Immature Granulocytes 0 %   Abs Immature Granulocytes 0.07 0.00 - 0.07 K/uL    Comment: Performed at [facility], [place] [address], [place], [place] [address]  Troponin I (High Sensitivity)     Status: None   Collection Time: 05/11/21  8:23 PM  Result Value Ref Range   Troponin I (High Sensitivity) 15 <18 ng/L    Comment: (NOTE) Elevated high sensitivity troponin I (hsTnI) values and significant  changes across serial measurements may suggest ACS but many other  chronic and acute conditions are known to elevate hsTnI results.  Refer to the Links section for chest pain algorithms and additional  guidance. Performed at [facility], [place] [address], [place], [place] [address]   TSH     Status: Abnormal   Collection Time: 05/11/21  8:23 PM  Result Value Ref Range   TSH <0.010 (L) 0.350 - 4.500 uIU/mL    Comment: Performed by a 3rd Generation assay with a functional sensitivity of <=0.01 uIU/mL. REPEATED TO VERIFY Performed at [facility], [place] [address], [place], [place] [address]   Protime-INR     Status:  None   Collection Time: 05/11/21  8:23 PM  Result Value Ref Range   Prothrombin Time 14.0 11.4 - 15.2 seconds   INR 1.1 0.8 - 1.2    Comment: (NOTE)  INR goal varies based on device and disease states. Performed at [facility], [place] [address], [place], [place] [address]   Acetaminophen level     Status: Abnormal   Collection Time: 05/11/21  8:23 PM  Result Value Ref Range   Acetaminophen (Tylenol), Serum <10 (L) 10 - 30 ug/mL    Comment: (NOTE) Therapeutic concentrations vary significantly. A range of 10-30 ug/mL  may be an effective concentration for many patients. However, some  are best treated at concentrations outside of this range. Acetaminophen concentrations >150 ug/mL at 4 hours after ingestion  and >50 ug/mL at 12 hours after ingestion are often associated with  toxic reactions.  Performed at [facility], [place] [address] [place], [place] [address]   Troponin I (High Sensitivity)     Status: None   Collection Time: 05/11/21 11:15 PM  Result Value Ref Range   Troponin I (High Sensitivity) 11 <18 ng/L    Comment: (NOTE) Elevated high sensitivity troponin I (hsTnI) values and significant  changes across serial measurements may suggest ACS but many other  chronic and acute conditions are known to elevate hsTnI results.  Refer to the "Links" section for chest pain algorithms and additional  guidance. Performed at [facility], [place] [address] [place], [place] [address]    DG Chest Port 1 View  Result Date: 05/11/2021 CLINICAL DATA:  Nausea and vomiting. EXAM: PORTABLE CHEST 1 VIEW COMPARISON:  Chest x-ray 03/15/2015. FINDINGS: The heart size and mediastinal contours are within normal limits. Both lungs are clear. The visualized skeletal structures are unremarkable. IMPRESSION: No active disease. Electronically Signed   By: Ronney Asters M.D.   On: 05/11/2021 20:18    Pending Labs Unresulted Labs (From admission,  onward)     Start     Ordered   05/11/21 2314  Resp Panel by RT-PCR (Flu A&B, Covid) Nasopharyngeal Swab  (Tier 2 - Symptomatic/asymptomatic)  Once,   R        05/11/21 2313   05/11/21 2232  Hepatitis panel, acute  Once,   STAT        05/11/21 2231   05/11/21 2232  T4, free  ONCE - STAT,   STAT        05/11/21 2231   05/11/21 1957  Urinalysis, Routine w reflex microscopic  Once,   STAT        05/11/21 1959            Vitals/Pain Today's Vitals   05/11/21 1952 05/11/21 2100 05/11/21 2200 05/12/21 0000  BP:  109/79 118/79 124/76  Pulse:  80 87 86  Resp:  (!) 28 (!) 22 17  Temp:    97.7 F (36.5 C)  TempSrc:    Oral  SpO2:  100% 100% 100%  Weight:      Height:      PainSc: 0-No pain       Isolation Precautions No active isolations  Medications Medications  lactated ringers bolus 1,000 mL (0 mLs Intravenous Stopped 05/11/21 2348)    Mobility walks Low fall risk   Focused Assessments    R Recommendations: See Admitting Provider Note  Report given to:   Additional Notes:

## 2021-05-12 NOTE — ED Provider Notes (Signed)
Ord DEPT Provider Note   CSN: [id number] Arrival date & time: 05/12/21  1152     History  Chief Complaint  Patient presents with   Abnormal Labs    Mary Frey is a 46 y.o. female with a past medical history of hyperthyroidism and elevated liver enzymes presenting today after leaving the emergency department early this morning AMA.  Patient was admitted and had a bed ready for her elevated liver enzymes and abnormal EKG.  She stated that she had a "family emergency" and had to leave but today she would like to stay.  Packed her bags and is ready for admission.  She reports that none of her symptoms have changed since yesterday but she does feel as though she has some increased pain in her throat/neck.  She is concerned that all of this is a presentation of a thyroid storm.    Home Medications Prior to Admission medications   Medication Sig Start Date End Date Taking? Authorizing Provider  atenolol (TENORMIN) 25 MG tablet Take 0.5 tablets (12.5 mg total) by mouth 2 (two) times daily. 10/22/19   Ghimire, Henreitta Leber, MD  cholestyramine light (PREVALITE) 4 g packet Take 1 packet (4 g total) by mouth 2 (two) times daily. Mix with 4-6 oz liquid.  Take other meds 1 hr before or 4-6 hr after cholestyramine. 11/03/19 05/11/22  Antonieta Pert, MD  diphenoxylate-atropine (LOMOTIL) 2.5-0.025 MG tablet Take 1 tablet by mouth 4 (four) times daily as needed for up to 20 doses for diarrhea or loose stools. Patient not taking: Reported on 05/11/2021 11/03/19   Antonieta Pert, MD  fenofibrate 54 MG tablet Take 54 mg by mouth daily. 04/26/21   [provider]  FEROSUL 325 (65 Fe) MG tablet Take 325 mg by mouth daily. 10/25/19   [provider]  hydrochlorothiazide (HYDRODIURIL) 25 MG tablet Take 25 mg by mouth daily. 04/12/21   [provider]  hydrOXYzine (ATARAX) 25 MG tablet Take 25 mg by mouth daily as needed for itching. 04/12/21   [provider]  melatonin 5 MG TABS Take 5 mg by mouth at bedtime.    [provider]  methocarbamol (ROBAXIN) 500 MG tablet Take 1 tablet (500 mg total) by mouth 2 (two) times daily. Patient taking differently: Take 500 mg by mouth daily as needed for muscle spasms. 05/23/20   Volanda Napoleon, PA-C  Multiple Vitamins-Minerals (MULTIVITAMIN ADULTS PO) Take 1 tablet by mouth daily.    [provider]  naproxen (NAPROSYN) 500 MG tablet Take 500 mg by mouth 2 (two) times daily as needed. 05/09/21   [provider]  triamcinolone cream (KENALOG) 0.1 % Apply 1 application topically 2 (two) times daily. 04/26/21   [provider]  valsartan (DIOVAN) 80 MG tablet Take 80 mg by mouth daily. 04/12/21   [provider]  norethindrone (MICRONOR,CAMILA,ERRIN) 0.35 MG tablet Take 1 tablet (0.35 mg total) by mouth daily. Patient not taking: Reported on 06/27/2015 03/30/14 03/30/19  Lahoma Crocker, MD  propranolol (INDERAL) 20 MG tablet Take 1 tablet (20 mg total) by mouth 2 (two) times daily. Patient not taking: Reported on 06/27/2015 08/12/13 03/30/19  Philemon Kingdom, MD      Allergies    Latex, Hydrocodone, Methimazole, Ptu [propylthiouracil], and Hydrocodone-acetaminophen    Review of Systems   Review of Systems Per HPI Physical Exam Updated Vital Signs BP 114/64    Pulse (!) 103    Temp 98 F (36.7 C) (  Oral)    Resp 16    Ht 5\' 5"  (1.651 m)    Wt 59.4 kg    SpO2 98%    BMI 21.80 kg/m  Physical Exam Vitals and nursing note reviewed.  Constitutional:      General: She is not in acute distress.    Appearance: Normal appearance. She is not ill-appearing.  HENT:     Head: Normocephalic and atraumatic.     Mouth/Throat:     Mouth: Mucous membranes are dry.     Pharynx: Oropharynx is clear.     Comments: Very clear, mucous membranes dry and pale Eyes:     General: Scleral icterus present.     Conjunctiva/sclera: Conjunctivae normal.  Cardiovascular:      Rate and Rhythm: Normal rate and regular rhythm.  Pulmonary:     Effort: Pulmonary effort is normal. No respiratory distress.  Musculoskeletal:     Cervical back: Normal range of motion. Tenderness (Muscles of posterior neck) present.  Skin:    General: Skin is warm and dry.     Coloration: Skin is not jaundiced.     Findings: No rash.  Neurological:     Mental Status: She is alert.  Psychiatric:        Mood and Affect: Mood normal.    ED Results / Procedures / Treatments   Labs (all labs ordered are listed, but only abnormal results are displayed) Labs Reviewed  TROPONIN I (HIGH SENSITIVITY)    EKG None  Radiology   Procedures Procedures   Medications Ordered in ED Medications - No data to display  ED Course/ Medical Decision Making/ A&P                           Medical Decision Making Amount and/or Complexity of Data Reviewed Radiology: ordered.  Risk Prescription drug management.   45 year old female who was admitted to the hospital prior to leaving AMA early this morning.  She returns requesting for admission.  Symptoms have not significantly changed.  Labs discontinued and COVID test is within the last 24 hours.  I spoke with Dr. Karle Starch, the physician who admitted her to the hospital last night, and he believes it is reasonable to admit her without a large work-up.  Hospitalist paged, Dr. [name] requesting CT abdomen to further visualize the liver.  This has been ordered as well as an i-STAT hCG.  Dr. Karle Starch also spoke with cardiology about her EKG changes and they state it is consistent with early repolarization.  She will be admitted, she is agreeable to this.   Final Clinical Impression(s) / ED Diagnoses Final diagnoses:  Elevated LFTs    Rx / DC Orders Admit to hospitalist, Dr. [name] accepting.   Darliss Ridgel 05/12/21 1347    Truddie Hidden, MD 05/12/21 [phone]

## 2021-05-13 DIAGNOSIS — R7989 Other specified abnormal findings of blood chemistry: Secondary | ICD-10-CM

## 2021-05-13 DIAGNOSIS — R197 Diarrhea, unspecified: Secondary | ICD-10-CM

## 2021-05-13 DIAGNOSIS — R7401 Elevation of levels of liver transaminase levels: Secondary | ICD-10-CM

## 2021-05-13 DIAGNOSIS — R9431 Abnormal electrocardiogram [ECG] [EKG]: Secondary | ICD-10-CM

## 2021-05-13 DIAGNOSIS — D72829 Elevated white blood cell count, unspecified: Secondary | ICD-10-CM

## 2021-05-13 DIAGNOSIS — E860 Dehydration: Secondary | ICD-10-CM

## 2021-05-13 DIAGNOSIS — E0590 Thyrotoxicosis, unspecified without thyrotoxic crisis or storm: Secondary | ICD-10-CM | POA: Diagnosis present

## 2021-05-13 DIAGNOSIS — E0500 Thyrotoxicosis with diffuse goiter without thyrotoxic crisis or storm: Secondary | ICD-10-CM

## 2021-05-13 LAB — GASTROINTESTINAL PANEL BY PCR, STOOL (REPLACES STOOL CULTURE)

## 2021-05-13 LAB — IRON AND TIBC
Iron: 152 ug/dL (ref 28–170)
Saturation Ratios: 73 % — ABNORMAL HIGH (ref 10.4–31.8)
TIBC: 208 ug/dL — ABNORMAL LOW (ref 250–450)
UIBC: 56 ug/dL

## 2021-05-13 LAB — ACETAMINOPHEN LEVEL: Acetaminophen (Tylenol), Serum: <10 ug/mL — ABNORMAL LOW (ref 10–30)

## 2021-05-13 LAB — HIV ANTIBODY (ROUTINE TESTING W REFLEX): HIV Screen 4th Generation wRfx: NONREACTIVE

## 2021-05-13 LAB — HEPATITIS PANEL, ACUTE
HCV Ab: NONREACTIVE
Hep A IgM: NONREACTIVE
Hep B C IgM: NONREACTIVE
Hepatitis B Surface Ag: NONREACTIVE

## 2021-05-13 LAB — COMPREHENSIVE METABOLIC PANEL
ALT: 460 U/L — ABNORMAL HIGH (ref 0–44)
AST: 559 U/L — ABNORMAL HIGH (ref 15–41)
Albumin: 2.6 g/dL — ABNORMAL LOW (ref 3.5–5.0)
Alkaline Phosphatase: 168 U/L — ABNORMAL HIGH (ref 38–126)
Anion gap: 10 (ref 5–15)
BUN: 17 mg/dL (ref 6–20)
CO2: 19 mmol/L — ABNORMAL LOW (ref 22–32)
Calcium: 9.6 mg/dL (ref 8.9–10.3)
Chloride: 109 mmol/L (ref 98–111)
Creatinine, Ser: <0.3 mg/dL — ABNORMAL LOW (ref 0.44–1.00)
Glucose, Bld: 79 mg/dL (ref 70–99)
Potassium: 3.7 mmol/L (ref 3.5–5.1)
Sodium: 138 mmol/L (ref 135–145)
Total Bilirubin: 28.2 mg/dL (ref 0.3–1.2)
Total Protein: 7.1 g/dL (ref 6.5–8.1)

## 2021-05-13 LAB — URINALYSIS, COMPLETE (UACMP) WITH MICROSCOPIC
Glucose, UA: NEGATIVE mg/dL
Hgb urine dipstick: NEGATIVE
Ketones, ur: NEGATIVE mg/dL
Leukocytes,Ua: NEGATIVE
Nitrite: NEGATIVE
Protein, ur: NEGATIVE mg/dL
Specific Gravity, Urine: 1.025 (ref 1.005–1.030)
pH: 5 (ref 5.0–8.0)

## 2021-05-13 LAB — CBC
HCT: 29.2 % — ABNORMAL LOW (ref 36.0–46.0)
Hemoglobin: 10.7 g/dL — ABNORMAL LOW (ref 12.0–15.0)
MCH: 25.1 pg — ABNORMAL LOW (ref 26.0–34.0)
MCHC: 36.6 g/dL — ABNORMAL HIGH (ref 30.0–36.0)
MCV: 68.5 fL — ABNORMAL LOW (ref 80.0–100.0)
Platelets: 333 10*3/uL (ref 150–400)
RBC: 4.26 MIL/uL (ref 3.87–5.11)
RDW: 16.5 % — ABNORMAL HIGH (ref 11.5–15.5)
WBC: 10.7 10*3/uL — ABNORMAL HIGH (ref 4.0–10.5)
nRBC: 0 % (ref 0.0–0.2)

## 2021-05-13 LAB — C-REACTIVE PROTEIN: CRP: 10.1 mg/dL — ABNORMAL HIGH (ref <1.0)

## 2021-05-13 MED ORDER — ATENOLOL 50 MG PO TABS: 50.0000 mg | ORAL_TABLET | Freq: Two times a day (BID) | ORAL | 1 refills | Status: DC

## 2021-05-13 MED ORDER — CHOLESTYRAMINE LIGHT 4 G PO PACK: 4.0000 g | PACK | Freq: Two times a day (BID) | ORAL | 1 refills | Status: DC

## 2021-05-13 MED ORDER — FLUTICASONE PROPIONATE 50 MCG/ACT NA SUSP
2.0000 | Freq: Every day | NASAL | Status: DC
  Administered 2021-05-13: 2 via NASAL
  Filled 2021-05-13: qty 16

## 2021-05-13 MED ORDER — HYDROXYZINE HCL 25 MG PO TABS
25.0000 mg | ORAL_TABLET | Freq: Every day | ORAL | Status: DC | PRN
  Filled 2021-05-13: qty 1

## 2021-05-13 MED ORDER — CHOLESTYRAMINE LIGHT 4 G PO PACK
4.0000 g | PACK | Freq: Two times a day (BID) | ORAL | Status: DC
  Administered 2021-05-13: 4 g via ORAL
  Filled 2021-05-13 (×2): qty 1

## 2021-05-13 MED ORDER — FERROUS SULFATE 325 (65 FE) MG PO TABS
325.0000 mg | ORAL_TABLET | Freq: Every day | ORAL | Status: DC
  Administered 2021-05-13: 325 mg via ORAL
  Filled 2021-05-13: qty 1

## 2021-05-13 MED ORDER — FLUTICASONE PROPIONATE 50 MCG/ACT NA SUSP: 2.0000 | Freq: Every day | NASAL | 0 refills | Status: DC

## 2021-05-13 MED ORDER — ATENOLOL 25 MG PO TABS
25.0000 mg | ORAL_TABLET | Freq: Once | ORAL | Status: AC
  Administered 2021-05-13: 25 mg via ORAL
  Filled 2021-05-13: qty 1

## 2021-05-13 MED ORDER — GUAIFENESIN ER 600 MG PO TB12: 1200.0000 mg | ORAL_TABLET | Freq: Two times a day (BID) | ORAL | 0 refills | Status: AC

## 2021-05-13 MED ORDER — PREDNISONE 20 MG PO TABS
20.0000 mg | ORAL_TABLET | Freq: Every day | ORAL | Status: DC
  Administered 2021-05-13: 20 mg via ORAL
  Filled 2021-05-13: qty 1

## 2021-05-13 MED ORDER — ATENOLOL 25 MG PO TABS: 50.0000 mg | ORAL_TABLET | Freq: Two times a day (BID) | ORAL | Status: DC

## 2021-05-13 MED ORDER — HYDROXYZINE HCL 25 MG PO TABS
25.0000 mg | ORAL_TABLET | Freq: Four times a day (QID) | ORAL | Status: DC | PRN
  Administered 2021-05-13: 25 mg via ORAL

## 2021-05-13 MED ORDER — LORATADINE 10 MG PO TABS: 10.0000 mg | ORAL_TABLET | Freq: Every day | ORAL | 0 refills | Status: DC

## 2021-05-13 MED ORDER — HYDROXYZINE HCL 25 MG PO TABS: 25.0000 mg | ORAL_TABLET | Freq: Three times a day (TID) | ORAL | 1 refills | Status: DC | PRN

## 2021-05-13 MED ORDER — ATENOLOL 25 MG PO TABS
25.0000 mg | ORAL_TABLET | Freq: Two times a day (BID) | ORAL | Status: DC
  Administered 2021-05-13: 25 mg via ORAL
  Filled 2021-05-13: qty 1

## 2021-05-13 MED ORDER — LORATADINE 10 MG PO TABS
10.0000 mg | ORAL_TABLET | Freq: Every day | ORAL | Status: DC
  Administered 2021-05-13: 10 mg via ORAL
  Filled 2021-05-13: qty 1

## 2021-05-13 MED ORDER — PREDNISONE 10 MG PO TABS: ORAL_TABLET | ORAL | 0 refills | Status: AC

## 2021-05-13 MED ORDER — MELATONIN 5 MG PO TABS: 5.0000 mg | ORAL_TABLET | Freq: Every day | ORAL | Status: DC

## 2021-05-13 MED ORDER — MELATONIN 5 MG PO TABS: 5.0000 mg | ORAL_TABLET | Freq: Every day | ORAL | 0 refills | Status: DC

## 2021-05-13 MED ORDER — GUAIFENESIN ER 600 MG PO TB12
1200.0000 mg | ORAL_TABLET | Freq: Two times a day (BID) | ORAL | Status: DC
  Administered 2021-05-13: 1200 mg via ORAL
  Filled 2021-05-13: qty 2

## 2021-05-13 NOTE — Consult Note (Signed)
Referring Provider: [facility] Primary Care Physician:  System, Provider Not In Primary Gastroenterologist: Unassigned./[facility]/  Dismissed  from [facility].  Reason for Consultation: Abnormal LFTs  HPI: Mary Frey is a 46 y.o. female with past medical history of elevated LFTs requiring hospitalization in 2021, history of Graves' disease, history of noncompliance admitted to the hospital with abdominal pain.  GI is consulted for further evaluation of abnormal LFTs .  Previous records reviewed.  Patient with significantly elevated T bili at around 30 since July 2021.  Previous extensive work-up negative except for minimally elevated ANA.  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  Patient was seen 1 time in [facility] office by our PA Ebony Hail and was started on prednisone.  Patient has subsequently been dismissed from our clinic because of noncompliance and refusal to arrange for follow-up in office as well as refusal to  arrange for follow-up labs.   Patient seen and examined at bedside.  She is feeling better today.  Denies abdominal pain, denies diarrhea.  Denies nausea and vomiting.  No blood in the stool or black stool  Discussed with RN.  Patient is having soft stool without any diarrhea  She  is noncompliant with staff recommendations Past Medical History:  Diagnosis Date   Anemia    Chronic back pain    Fibroids    Graves disease    Hypertension    Pregnancy induced hypertension     Past Surgical History:  Procedure Laterality Date   IR US GUIDE BX ASP/DRAIN  10/29/2019   NO PAST SURGERIES      Prior to Admission medications   Medication Sig Start Date End Date Taking? Authorizing Provider  atenolol (TENORMIN) 25 MG tablet Take 0.5 tablets (12.5 mg total) by mouth 2 (two) times daily. Patient taking differently: Take 25 mg by mouth 2 (two) times daily. 10/22/19  Yes Ghimire, Henreitta Leber, MD  cholestyramine light (PREVALITE) 4 g  packet Take 1 packet (4 g total) by mouth 2 (two) times daily. Mix with 4-6 oz liquid.  Take other meds 1 hr before or 4-6 hr after cholestyramine. 11/03/19 05/11/22 Yes Antonieta Pert, MD  diphenoxylate-atropine (LOMOTIL) 2.5-0.025 MG tablet Take 1 tablet by mouth 4 (four) times daily as needed for up to 20 doses for diarrhea or loose stools. 11/03/19  Yes Antonieta Pert, MD  fenofibrate 54 MG tablet Take 54 mg by mouth daily. 04/26/21  Yes [provider]  FEROSUL 325 (65 Fe) MG tablet Take 325 mg by mouth daily. 10/25/19  Yes [provider]  hydrochlorothiazide (HYDRODIURIL) 25 MG tablet Take 25 mg by mouth 3 (three) times daily. 04/12/21  Yes [provider]  hydrOXYzine (ATARAX) 25 MG tablet Take 25 mg by mouth daily as needed for itching. 04/12/21  Yes [provider]  Multiple Vitamins-Minerals (MULTIVITAMIN ADULTS PO) Take 1 tablet by mouth daily.   Yes [provider]  naproxen (NAPROSYN) 500 MG tablet Take 500 mg by mouth 2 (two) times daily as needed. 05/09/21  Yes [provider]  triamcinolone cream (KENALOG) 0.1 % Apply 1 application topically 2 (two) times daily. 04/26/21  Yes [provider]  valsartan (DIOVAN) 80 MG tablet Take 80 mg by mouth daily. 04/12/21  Yes [provider]  methocarbamol (ROBAXIN) 500 MG tablet Take 1 tablet (500 mg total) by mouth 2 (two) times daily. Patient not taking: Reported on 05/13/2021 05/23/20   Volanda Napoleon, PA-C  norethindrone (  MICRONOR,CAMILA,ERRIN) 0.35 MG tablet Take 1 tablet (0.35 mg total) by mouth daily. Patient not taking: Reported on 06/27/2015 03/30/14 03/30/19  Lahoma Crocker, MD  propranolol (INDERAL) 20 MG tablet Take 1 tablet (20 mg total) by mouth 2 (two) times daily. Patient not taking: Reported on 06/27/2015 08/12/13 03/30/19  Philemon Kingdom, MD    Scheduled Meds:  atenolol  25 mg Oral BID   cholestyramine light  4 g Oral BID   ferrous sulfate  325 mg Oral Daily    melatonin  5 mg Oral QHS   multivitamin with minerals  1 tablet Oral Daily   Continuous Infusions:  lactated ringers 125 mL/hr at 05/13/21 0852   PRN Meds:.hydrOXYzine  Allergies as of 05/12/2021 - Review Complete 05/12/2021  Allergen Reaction Noted   Latex Itching, Rash, and Other (See Comments) 10/01/2011   Hydrocodone Nausea And Vomiting 10/01/2011   Methimazole Diarrhea 10/14/2019   Ptu [propylthiouracil] Other (See Comments) 10/14/2019   Hydrocodone-acetaminophen Other (See Comments) 02/19/2018    Family History  Problem Relation Age of Onset   Diabetes Maternal Aunt    Diabetes Maternal Uncle    Diabetes Maternal Grandmother    Diabetes Maternal Grandfather    Stroke Paternal Grandfather    Breast cancer Mother     Social History   Socioeconomic History   Marital status: Single    Spouse name: Not on file   Number of children: Not on file   Years of education: Not on file   Highest education level: Not on file  Occupational History   Not on file  Tobacco Use   Smoking status: Light Smoker    Years: 14.00    Types: Cigarettes   Smokeless tobacco: Never  Substance and Sexual Activity   Alcohol use: Yes    Alcohol/week: 0.0 standard drinks    Comment: on occasion   Drug use: No    Types: Cocaine   Sexual activity: Not Currently  Other Topics Concern   Not on file  Social History Narrative   Regular exercise: walk   Caffeine use: 2 times a week   Social Determinants of Health   Financial Resource Strain: Not on file  Food Insecurity: Not on file  Transportation Needs: Not on file  Physical Activity: Not on file  Stress: Not on file  Social Connections: Not on file  Intimate Partner Violence: Not on file    Review of Systems: All negative except as stated above in HPI.  Physical Exam: Vital signs: Vitals:   05/13/21 0511 05/13/21 0838  BP: (!) 149/86 129/86  Pulse: (!) 122 78  Resp: 17 (!) 21  Temp: 98 F (36.7 C) 98.5 F (36.9 C)  SpO2:  100% 100%     General:   Alert,  Well-developed, well-nourished, pleasant and cooperative in NAD Lungs:  Clear throughout to auscultation.   No wheezes, crackles, or rhonchi. No acute distress. Heart:  Regular rate and rhythm; no murmurs, clicks, rubs,  or gallops. Abdomen: Soft, nontender , nondistended, bowel sounds present. No  Peritoneal signs Rectal:  Deferred  GI:  Lab Results: Recent Labs    05/11/21 2023 05/13/21 0502  WBC 15.6* 10.7*  HGB 12.4 10.7*  HCT 33.6* 29.2*  PLT 410* 333   BMET Recent Labs    05/11/21 2023 05/13/21 0502  NA 134* 138  K 4.7 3.7  CL 107 109  CO2 19* 19*  GLUCOSE 115* 79  BUN 23* 17  CREATININE 0.46 <0.30*  CALCIUM 10.3 9.6  LFT Recent Labs    05/13/21 0502  PROT 7.1  ALBUMIN 2.6*  AST 559*  ALT 460*  ALKPHOS 168*  BILITOT 28.2*   PT/INR Recent Labs    05/11/21 2023  LABPROT 14.0  INR 1.1     Studies/Results: CT Abdomen Pelvis W Contrast  Result Date: 05/12/2021 CLINICAL DATA:  Hepatitis, lower abdominal pain EXAM: CT ABDOMEN AND PELVIS WITH CONTRAST TECHNIQUE: Multidetector CT imaging of the abdomen and pelvis was performed using the standard protocol following bolus administration of intravenous contrast. RADIATION DOSE REDUCTION: This exam was performed according to the departmental dose-optimization program which includes automated exposure control, adjustment of the mA and/or kV according to patient size and/or use of iterative reconstruction technique. CONTRAST:  14mL OMNIPAQUE IOHEXOL 300 MG/ML  SOLN COMPARISON:  10/28/2019 FINDINGS: Lower chest: No acute abnormality. Hepatobiliary: No solid liver abnormality is seen. No gallstones, gallbladder wall thickening, or biliary dilatation. Pancreas: Unremarkable. No pancreatic ductal dilatation or surrounding inflammatory changes. Spleen: Normal in size without significant abnormality. Adrenals/Urinary Tract: Adrenal glands are unremarkable. Kidneys are normal, without renal  calculi, solid lesion, or hydronephrosis. Bladder is unremarkable. Stomach/Bowel: Stomach is within normal limits. Appendix appears normal. No evidence of bowel wall thickening, distention, or inflammatory changes. Vascular/Lymphatic: No significant vascular findings are present. No enlarged abdominal or pelvic lymph nodes. Reproductive: Heterogeneously hypoenhancing uterine fibroids. Other: No abdominal wall hernia or abnormality. No ascites. Musculoskeletal: No acute or significant osseous findings. IMPRESSION: 1. No acute CT findings of the abdomen or pelvis to explain pain. Specifically, no CT abnormality of the liver per indication of hepatitis. 2. Uterine fibroids. Electronically Signed   By: Delanna Ahmadi M.D.   On: 05/12/2021 15:11   DG Chest Port 1 View  Result Date: 05/11/2021 CLINICAL DATA:  Nausea and vomiting. EXAM: PORTABLE CHEST 1 VIEW COMPARISON:  Chest x-ray 03/15/2015. FINDINGS: The heart size and mediastinal contours are within normal limits. Both lungs are clear. The visualized skeletal structures are unremarkable. IMPRESSION: No active disease. Electronically Signed   By: Ronney Asters M.D.   On: 05/11/2021 20:18   ECHOCARDIOGRAM COMPLETE  Result Date: 05/12/2021    ECHOCARDIOGRAM REPORT   Patient Name:   Mary Frey Date of Exam: 05/12/2021 Medical Rec #:  [id number]        Height:       65.0 in Accession #:    [id number]       Weight:       131.0 lb Date of Birth:  Oct 23, 1975         BSA:          1.653 m Patient Age:    82 years         BP:           112/73 mmHg Patient Gender: F                HR:           99 bpm. Exam Location:  Inpatient Procedure: 2D Echo, Cardiac Doppler and Color Doppler Indications:    Chest Pain R07.9  History:        Patient has prior history of Echocardiogram examinations, most                 recent 01/12/2013. Signs/Symptoms:Chest Pain. Thyroid disease.                 Liver disease.  Sonographer:    Merrie Roof RDCS Referring Phys: [id number] Jonnie Finner  IMPRESSIONS  1. Left ventricular ejection fraction, by estimation, is 60 to 65%. The left ventricle has normal function. The left ventricle has no regional wall motion abnormalities. Left ventricular diastolic parameters were normal.  2. Right ventricular systolic function is normal. The right ventricular size is normal.  3. The mitral valve is abnormal. Mild mitral valve regurgitation. No evidence of mitral stenosis.  4. Small gradient across LVOT/AV no obvious sub aortic web and valve appears to open well there is some chordal SAM and hyperdynamic EF which may contribute . The aortic valve is tricuspid. Aortic valve regurgitation is not visualized. No aortic stenosis is present.  5. The inferior vena cava is normal in size with greater than 50% respiratory variability, suggesting right atrial pressure of 3 mmHg. FINDINGS  Left Ventricle: Left ventricular ejection fraction, by estimation, is 60 to 65%. The left ventricle has normal function. The left ventricle has no regional wall motion abnormalities. The left ventricular internal cavity size was normal in size. There is  no left ventricular hypertrophy. Left ventricular diastolic parameters were normal. Right Ventricle: The right ventricular size is normal. No increase in right ventricular wall thickness. Right ventricular systolic function is normal. Left [facility]: Left atrial size was normal in size. Right [facility]: Right atrial size was normal in size. Pericardium: There is no evidence of pericardial effusion. Mitral Valve: The mitral valve is abnormal. There is mild thickening of the mitral valve leaflet(s). There is mild calcification of the mitral valve leaflet(s). Mild mitral annular calcification. Mild mitral valve regurgitation. No evidence of mitral valve stenosis. Tricuspid Valve: The tricuspid valve is normal in structure. Tricuspid valve regurgitation is mild . No evidence of tricuspid stenosis. Aortic Valve: Small gradient across LVOT/AV no obvious sub  aortic web and valve appears to open well there is some chordal SAM and hyperdynamic EF which may contribute. The aortic valve is tricuspid. Aortic valve regurgitation is not visualized. No aortic stenosis is present. Aortic valve mean gradient measures 9.0 mmHg. Aortic valve peak gradient measures 16.2 mmHg. Aortic valve area, by VTI measures 2.61 cm. Pulmonic Valve: The pulmonic valve was normal in structure. Pulmonic valve regurgitation is mild. No evidence of pulmonic stenosis. Aorta: The aortic root is normal in size and structure. Venous: The inferior vena cava is normal in size with greater than 50% respiratory variability, suggesting right atrial pressure of 3 mmHg. IAS/Shunts: No atrial level shunt detected by color flow Doppler.  LEFT VENTRICLE PLAX 2D LVIDd:         3.40 cm   Diastology LVIDs:         2.20 cm   LV e' medial:    12.10 cm/s LV PW:         1.00 cm   LV E/e' medial:  6.4 LV IVS:        1.00 cm   LV e' lateral:   9.68 cm/s LVOT diam:     2.00 cm   LV E/e' lateral: 8.0 LV SV:         77 LV SV Index:   47 LVOT Area:     3.14 cm  RIGHT VENTRICLE RV Basal diam:  3.40 cm LEFT ATRIUM             Index        RIGHT ATRIUM           Index LA diam:        3.40 cm 2.06 cm/m   RA Area:  14.30 cm LA Vol (A2C):   37.9 ml 22.93 ml/m  RA Volume:   40.70 ml  24.63 ml/m LA Vol (A4C):   48.7 ml 29.47 ml/m LA Biplane Vol: 47.3 ml 28.62 ml/m  AORTIC VALVE AV Area (Vmax):    2.52 cm AV Area (Vmean):   2.34 cm AV Area (VTI):     2.61 cm AV Vmax:           201.00 cm/s AV Vmean:          138.000 cm/s AV VTI:            0.296 m AV Peak Grad:      16.2 mmHg AV Mean Grad:      9.0 mmHg LVOT Vmax:         161.00 cm/s LVOT Vmean:        103.000 cm/s LVOT VTI:          0.246 m LVOT/AV VTI ratio: 0.83  AORTA Ao Root diam: 2.70 cm Ao Asc diam:  2.80 cm MITRAL VALVE MV Area (PHT): 3.93 cm    SHUNTS MV Decel Time: 193 msec    Systemic VTI:  0.25 m MV E velocity: 77.60 cm/s  Systemic Diam: 2.00 cm MV A velocity:  75.00 cm/s MV E/A ratio:  1.03 Jenkins Rouge MD Electronically signed by Jenkins Rouge MD Signature Date/Time: 05/12/2021/2:57:32 PM    Final    US Abdomen Limited RUQ (LIVER/GB)  Result Date: 05/12/2021 CLINICAL DATA:  Elevated LFTs EXAM: ULTRASOUND ABDOMEN LIMITED RIGHT UPPER QUADRANT COMPARISON:  Abdominal ultrasound 10/19/2019 FINDINGS: Study is limited due to patient breathing motion. Gallbladder: No gallstones or wall thickening visualized. Intraluminal low-level echoes likely represent sludge and artifacts. No sonographic Murphy sign noted by sonographer. Common bile duct: Diameter: 2 mm Liver: No focal lesion identified. Within normal limits in parenchymal echogenicity. Portal vein is patent on color Doppler imaging with normal direction of blood flow towards the liver. Other: None. IMPRESSION: No acute process identified.  Gallbladder sludge. Electronically Signed   By: Ofilia Neas M.D.   On: 05/12/2021 16:58    Impression/Plan: -Abnormal LFTs with jaundice significantly elevated T bili at 28.  Similar presentation in September 2021 with negative extensive work-up in the past including liver biopsy.  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  Hepatitis panel negative. -Diarrhea.  Resolved.  Currently having formed stool -Abdominal pain.  Resolved.  CT abdomen pelvis as well as ultrasound negative for acute changes -History of Graves' disease.  Not taking her thyroid medicines. -History of noncompliance   Recommendations ------------------------- -Start prednisone 20 mg once a day for 2 weeks followed by prednisone 10 mg once a day for another 2 weeks for possible drug-induced liver injury. -Patient is noncompliant with staff recommendations.  History of noncompliance in the past when she refused follow-ups and blood works.  She has been dismissed from our office because of noncompliance. -[facility].  Looks like she currently lives in [place]. -Discussed with hospitalist. -No further inpatient GI work-up planned.  Recommend follow-up with [facility] or liver clinic in [place] when she go back to [place] will sign off.  Call us back if needed    LOS: 1 day   Otis Brace  MD, [place] 05/13/2021, 9:59 AM  Contact #  [phone]

## 2021-05-13 NOTE — Progress Notes (Incomplete)
PROGRESS NOTE    Mary Frey  CZY:[id number] DOB: 1975-10-02 DOA: 05/12/2021 PCP: System, Provider Not In (Confirm with patient/family/NH records and if not entered, this HAS to be entered at [facility] point of entry. "No PCP" if truly none.)   Chief Complaint  Patient presents with   Abnormal Labs    Brief Narrative: (Start on day 1 of progress note - keep it brief and live) ***   Assessment & Plan:   Principal Problem:   Diarrhea   ***   DVT prophylaxis: (Lovenox/Heparin/SCD's/anticoagulated/None (if comfort care) Code Status: (Full/Partial - specify details) Family Communication: (Specify name, relationship & date discussed. NO "discussed with patient") Disposition:   Status is: Inpatient  {Inpatient:23812}       Consultants:  ***  Procedures: (Don't include imaging studies which can be auto populated. Include things that cannot be auto populated i.e. Echo, Carotid and venous dopplers, Foley, Bipap, HD, tubes/drains, wound vac, central lines etc) ***  Antimicrobials: (specify start and planned stop date. Auto populated tables are space occupying and do not give end dates) ***    Subjective: ***  Objective: Vitals:   05/12/21 2201 05/13/21 0014 05/13/21 0511 05/13/21 0838  BP: (!) 148/88 (!) 145/79 (!) 149/86 129/86  Pulse: (!) 116 (!) 119 (!) 122 78  Resp:  18 17 (!) 21  Temp: 98.7 F (37.1 C) 98.2 F (36.8 C) 98 F (36.7 C) 98.5 F (36.9 C)  TempSrc: Oral Oral Oral Oral  SpO2: 99% 100% 100% 100%  Weight:      Height:       No intake or output data in the 24 hours ending 05/13/21 1231 Filed Weights   05/12/21 1202  Weight: 59.4 kg    Examination:  General exam: Appears calm and comfortable  Respiratory system: Clear to auscultation. Respiratory effort normal. Cardiovascular system: S1 & S2 heard, RRR. No JVD, murmurs, rubs, gallops or clicks. No pedal edema. Gastrointestinal system: Abdomen is nondistended, soft and nontender. No  organomegaly or masses felt. Normal bowel sounds heard. Central nervous system: Alert and oriented. No focal neurological deficits. Extremities: Symmetric 5 x 5 power. Skin: No rashes, lesions or ulcers Psychiatry: Judgement and insight appear normal. Mood & affect appropriate.     Data Reviewed: I have personally reviewed following labs and imaging studies  CBC: Recent Labs  Lab 05/11/21 2023 05/13/21 0502  WBC 15.6* 10.7*  NEUTROABS 12.9*  --   HGB 12.4 10.7*  HCT 33.6* 29.2*  MCV 68.4* 68.5*  PLT 410* 093    Basic Metabolic Panel: Recent Labs  Lab 05/11/21 2023 05/13/21 0502  NA 134* 138  K 4.7 3.7  CL 107 109  CO2 19* 19*  GLUCOSE 115* 79  BUN 23* 17  CREATININE 0.46 <0.30*  CALCIUM 10.3 9.6    GFR: CrCl cannot be calculated (This lab value cannot be used to calculate CrCl because it is not a number: <0.30).  Liver Function Tests: Recent Labs  Lab 05/11/21 2023 05/13/21 0502  AST 880* 559*  ALT 635* 460*  ALKPHOS 203* 168*  BILITOT 28.5* 28.2*  PROT 8.7* 7.1  ALBUMIN 3.1* 2.6*    CBG: No results for input(s): GLUCAP in the last 168 hours.   Recent Results (from the past 240 hour(s))  Resp Panel by RT-PCR (Flu A&B, Covid) Nasopharyngeal Swab     Status: None   Collection Time: 05/11/21 11:14 PM   Specimen: Nasopharyngeal Swab; Nasopharyngeal(NP) swabs in vial transport medium  Result Value Ref  Range Status   SARS Coronavirus 2 by RT PCR NEGATIVE NEGATIVE Final    Comment: (NOTE) SARS-CoV-2 target nucleic acids are NOT DETECTED.  The SARS-CoV-2 RNA is generally detectable in upper respiratory specimens during the acute phase of infection. The lowest concentration of SARS-CoV-2 viral copies this assay can detect is 138 copies/mL. A negative result does not preclude SARS-Cov-2 infection and should not be used as the sole basis for treatment or other patient management decisions. A negative result may occur with  improper specimen  collection/handling, submission of specimen other than nasopharyngeal swab, presence of viral mutation(s) within the areas targeted by this assay, and inadequate number of viral copies(<138 copies/mL). A negative result must be combined with clinical observations, patient history, and epidemiological information. The expected result is Negative.  Fact Sheet for Patients:  EntrepreneurPulse.com.au  Fact Sheet for Healthcare Providers:  IncredibleEmployment.be  This test is no t yet approved or cleared by the [place] FDA and  has been authorized for detection and/or diagnosis of SARS-CoV-2 by FDA under an Emergency Use Authorization (EUA). This EUA will remain  in effect (meaning this test can be used) for the duration of the COVID-19 declaration under Section 564(b)(1) of the Act, 21 U.S.C.section 360bbb-3(b)(1), unless the authorization is terminated  or revoked sooner.       Influenza A by PCR NEGATIVE NEGATIVE Final   Influenza B by PCR NEGATIVE NEGATIVE Final    Comment: (NOTE) The Xpert Xpress SARS-CoV-2/FLU/RSV plus assay is intended as an aid in the diagnosis of influenza from Nasopharyngeal swab specimens and should not be used as a sole basis for treatment. Nasal washings and aspirates are unacceptable for Xpert Xpress SARS-CoV-2/FLU/RSV testing.  Fact Sheet for Patients: EntrepreneurPulse.com.au  Fact Sheet for Healthcare Providers: IncredibleEmployment.be  This test is not yet approved or cleared by the [place] FDA and has been authorized for detection and/or diagnosis of SARS-CoV-2 by FDA under an Emergency Use Authorization (EUA). This EUA will remain in effect (meaning this test can be used) for the duration of the COVID-19 declaration under Section 564(b)(1) of the Act, 21 U.S.C. section 360bbb-3(b)(1), unless the authorization is terminated or revoked.  Performed at [facility], [place] [address] [place] [address], [place],  [address]          Radiology Studies: CT Abdomen Pelvis W Contrast  Result Date: 05/12/2021 CLINICAL DATA:  Hepatitis, lower abdominal pain EXAM: CT ABDOMEN AND PELVIS WITH CONTRAST TECHNIQUE: Multidetector CT imaging of the abdomen and pelvis was performed using the standard protocol following bolus administration of intravenous contrast. RADIATION DOSE REDUCTION: This exam was performed according to the departmental dose-optimization program which includes automated exposure control, adjustment of the mA and/or kV according to patient size and/or use of iterative reconstruction technique. CONTRAST:  137mL OMNIPAQUE IOHEXOL 300 MG/ML  SOLN COMPARISON:  10/28/2019 FINDINGS: Lower chest: No acute abnormality. Hepatobiliary: No solid liver abnormality is seen. No gallstones, gallbladder wall thickening, or biliary dilatation. Pancreas: Unremarkable. No pancreatic ductal dilatation or surrounding inflammatory changes. Spleen: Normal in size without significant abnormality. Adrenals/Urinary Tract: Adrenal glands are unremarkable. Kidneys are normal, without renal calculi, solid lesion, or hydronephrosis. Bladder is unremarkable. Stomach/Bowel: Stomach is within normal limits. Appendix appears normal. No evidence of bowel wall thickening, distention, or inflammatory changes. Vascular/Lymphatic: No significant vascular findings are present. No enlarged abdominal or pelvic lymph nodes. Reproductive: Heterogeneously hypoenhancing uterine fibroids. Other: No abdominal wall hernia or abnormality. No ascites. Musculoskeletal: No acute or significant osseous findings. IMPRESSION:  1. No acute CT findings of the abdomen or pelvis to explain pain. Specifically, no CT abnormality of the liver per indication of hepatitis. 2. Uterine fibroids. Electronically Signed   By: Delanna Ahmadi M.D.   On: 05/12/2021 15:11   DG Chest Port 1 View  Result Date:  05/11/2021 CLINICAL DATA:  Nausea and vomiting. EXAM: PORTABLE CHEST 1 VIEW COMPARISON:  Chest x-ray 03/15/2015. FINDINGS: The heart size and mediastinal contours are within normal limits. Both lungs are clear. The visualized skeletal structures are unremarkable. IMPRESSION: No active disease. Electronically Signed   By: Ronney Asters M.D.   On: 05/11/2021 20:18   ECHOCARDIOGRAM COMPLETE  Result Date: 05/12/2021    ECHOCARDIOGRAM REPORT   Patient Name:   DOREE KUEHNE Burgner Date of Exam: 05/12/2021 Medical Rec #:  [id number]        Height:       65.0 in Accession #:    [id number]       Weight:       131.0 lb Date of Birth:  [birth year]         BSA:          1.653 m Patient Age:    62 years         BP:           112/73 mmHg Patient Gender: F                HR:           99 bpm. Exam Location:  Inpatient Procedure: 2D Echo, Cardiac Doppler and Color Doppler Indications:    Chest Pain R07.9  History:        Patient has prior history of Echocardiogram examinations, most                 recent 01/12/2013. Signs/Symptoms:Chest Pain. Thyroid disease.                 Liver disease.  Sonographer:    Merrie Roof RDCS Referring Phys: [id number] [place]  1. Left ventricular ejection fraction, by estimation, is 60 to 65%. The left ventricle has normal function. The left ventricle has no regional wall motion abnormalities. Left ventricular diastolic parameters were normal.  2. Right ventricular systolic function is normal. The right ventricular size is normal.  3. The mitral valve is abnormal. Mild mitral valve regurgitation. No evidence of mitral stenosis.  4. Small gradient across LVOT/AV no obvious sub aortic web and valve appears to open well there is some chordal SAM and hyperdynamic EF which may contribute . The aortic valve is tricuspid. Aortic valve regurgitation is not visualized. No aortic stenosis is present.  5. The inferior vena cava is normal in size with greater than 50% respiratory variability,  suggesting right atrial pressure of 3 mmHg. FINDINGS  Left Ventricle: Left ventricular ejection fraction, by estimation, is 60 to 65%. The left ventricle has normal function. The left ventricle has no regional wall motion abnormalities. The left ventricular internal cavity size was normal in size. There is  no left ventricular hypertrophy. Left ventricular diastolic parameters were normal. Right Ventricle: The right ventricular size is normal. No increase in right ventricular wall thickness. Right ventricular systolic function is normal. Left Atrium: Left atrial size was normal in size. Right Atrium: Right atrial size was normal in size. Pericardium: There is no evidence of pericardial effusion. Mitral Valve: The mitral valve is abnormal. There is mild thickening of the mitral valve leaflet(s). There is  mild calcification of the mitral valve leaflet(s). Mild mitral annular calcification. Mild mitral valve regurgitation. No evidence of mitral valve stenosis. Tricuspid Valve: The tricuspid valve is normal in structure. Tricuspid valve regurgitation is mild . No evidence of tricuspid stenosis. Aortic Valve: Small gradient across LVOT/AV no obvious sub aortic web and valve appears to open well there is some chordal SAM and hyperdynamic EF which may contribute. The aortic valve is tricuspid. Aortic valve regurgitation is not visualized. No aortic stenosis is present. Aortic valve mean gradient measures 9.0 mmHg. Aortic valve peak gradient measures 16.2 mmHg. Aortic valve area, by VTI measures 2.61 cm. Pulmonic Valve: The pulmonic valve was normal in structure. Pulmonic valve regurgitation is mild. No evidence of pulmonic stenosis. Aorta: The aortic root is normal in size and structure. Venous: The inferior vena cava is normal in size with greater than 50% respiratory variability, suggesting right atrial pressure of 3 mmHg. IAS/Shunts: No atrial level shunt detected by color flow Doppler.  LEFT VENTRICLE PLAX 2D LVIDd:          3.40 cm   Diastology LVIDs:         2.20 cm   LV e' medial:    12.10 cm/s LV PW:         1.00 cm   LV E/e' medial:  6.4 LV IVS:        1.00 cm   LV e' lateral:   9.68 cm/s LVOT diam:     2.00 cm   LV E/e' lateral: 8.0 LV SV:         77 LV SV Index:   47 LVOT Area:     3.14 cm  RIGHT VENTRICLE RV Basal diam:  3.40 cm LEFT ATRIUM             Index        RIGHT ATRIUM           Index LA diam:        3.40 cm 2.06 cm/m   RA Area:     14.30 cm LA Vol (A2C):   37.9 ml 22.93 ml/m  RA Volume:   40.70 ml  24.63 ml/m LA Vol (A4C):   48.7 ml 29.47 ml/m LA Biplane Vol: 47.3 ml 28.62 ml/m  AORTIC VALVE AV Area (Vmax):    2.52 cm AV Area (Vmean):   2.34 cm AV Area (VTI):     2.61 cm AV Vmax:           201.00 cm/s AV Vmean:          138.000 cm/s AV VTI:            0.296 m AV Peak Grad:      16.2 mmHg AV Mean Grad:      9.0 mmHg LVOT Vmax:         161.00 cm/s LVOT Vmean:        103.000 cm/s LVOT VTI:          0.246 m LVOT/AV VTI ratio: 0.83  AORTA Ao Root diam: 2.70 cm Ao Asc diam:  2.80 cm MITRAL VALVE MV Area (PHT): 3.93 cm    SHUNTS MV Decel Time: 193 msec    Systemic VTI:  0.25 m MV E velocity: 77.60 cm/s  Systemic Diam: 2.00 cm MV A velocity: 75.00 cm/s MV E/A ratio:  1.03 Jenkins Rouge MD Electronically signed by Jenkins Rouge MD Signature Date/Time: 05/12/2021/2:57:32 PM    Final    US Abdomen Limited RUQ (LIVER/GB)  Result Date: 05/12/2021 CLINICAL DATA:  Elevated LFTs EXAM: ULTRASOUND ABDOMEN LIMITED RIGHT UPPER QUADRANT COMPARISON:  Abdominal ultrasound 10/19/2019 FINDINGS: Study is limited due to patient breathing motion. Gallbladder: No gallstones or wall thickening visualized. Intraluminal low-level echoes likely represent sludge and artifacts. No sonographic Murphy sign noted by sonographer. Common bile duct: Diameter: 2 mm Liver: No focal lesion identified. Within normal limits in parenchymal echogenicity. Portal vein is patent on color Doppler imaging with normal direction of blood flow towards the  liver. Other: None. IMPRESSION: No acute process identified.  Gallbladder sludge. Electronically Signed   By: Ofilia Neas M.D.   On: 05/12/2021 16:58        Scheduled Meds:  atenolol  25 mg Oral BID   cholestyramine light  4 g Oral BID   ferrous sulfate  325 mg Oral Daily   melatonin  5 mg Oral QHS   multivitamin with minerals  1 tablet Oral Daily   predniSONE  20 mg Oral Q breakfast   Continuous Infusions:  lactated ringers 125 mL/hr at 05/13/21 0852     LOS: 1 day    Time spent: ***    Irine Seal, MD [facility]   To contact the attending provider between 7A-7P or the covering provider during after hours 7P-7A, please log into the web site [email or web address] and access using universal [place] password for that web site. If you do not have the password, please call the hospital operator.  05/13/2021, 12:31 PM

## 2021-05-13 NOTE — Progress Notes (Signed)
After being notified by another RN that patient removed tele monitor I entered the room to assess patient. I introduced myself as her nurse for the night and asked her preferred name, at which time patient responded Mary Frey. I turn to the vistor sitting at beside and repeated my introduction and asked his relation to the patient. Patient vistor did not response verbally and shook his head no to my question about being family, friend or spouse. I turned to the patient she as well refused to answer. When asked what happened with the tele monitor patient stated that it was causing chest pain and wanted pain meds. Patient future expressed concerns that she has seen numerous staff persons and no one is telling her what is going on or what they are doing in regards to her care. I then explained the duties of various staff persons that she may have and would come in contact with upon arrival to the unit. Patient verbalized understanding. Patient was educated on the use of telemetry monitor and she again verbalized understanding but refused to have monitor reapply until pain meds are received. I then advised the patient that I would finish getting report and come back to explain current orders and address any additional concerns.  Upon leaving patients room I returned to continue shift change reports with nurse (Christa, LPN) and expressed to her patients' concerns/complaints. During which conversation the nurse expressed that she already explained to patient the use and purpose of tele monitor.   Upon return to the patient room to assess and implement MD orders patient refused Skin assessment and admission assessment stating that she is in too much pain. During this time patient refuse blood draw. Patient concerns where addressed.   2123 MD made aware of patient condition and request for pain meds.   Patient tele monitor was reapplied. Patient continued to refuse care from RN and refused blood draw from lab for a  total of 4 attempts by 2 different team members.   0230 Patient removed tele monitor, when asked she stated that the monitor was causing her pain. Patient educated and MD notified. Patient remains in bed no signs of distress noted. Safety measures in place, no additional needs at this times. Will continue to monitor.

## 2021-05-13 NOTE — Progress Notes (Signed)
Pt is noted to be off of telemetry- when I entered the room the monitor was laying by  the sink. I asked about reapplying telemetry & Pt states she took it off because it was causing her to have chest pain and that she was having pain & is  wanting pain meds. Informed her primary nurse who went in to evaluate patient.

## 2021-05-13 NOTE — Discharge Summary (Signed)
Physician Discharge Summary  Mary Frey OIZ:[id number] DOB: [birth year] DOA: 05/12/2021  PCP: System, Provider Not In  Admit date: 05/12/2021 Discharge date: 05/13/2021  Time spent: 60 minutes  Recommendations for Outpatient Follow-up:  Follow-up with [facility] in 1 to 2 weeks for further management. Follow-up with primary endocrinologist as scheduled for definite management of hyperthyroidism.   Discharge Diagnoses:  Principal Problem:   Transaminitis Active Problems:   Graves disease   HTN (hypertension)   Jaundice   Elevated LFTs   Diarrhea   Dehydration   Abnormal EKG   Leukocytosis   Hyperthyroidism   Discharge Condition: Stable and improved.  Diet recommendation: Heart healthy  Filed Weights   05/12/21 1202  Weight: 59.4 kg    History of present illness:  HPI per Dr. Marion Frey is a 46 y.o. female with medical history significant of hyperthyroidism, chronic pain. Presenting with jaundice, diarrhea, and palpitations. She reports that she's had about 3 weeks of persistent diarrhea. During this time she had tried several OTC meds, but they have not been helpful. She has become increasingly fatigued and short of breath w/ activity during that time. She has noticed increased palpitations. She denies N/V. She came to the ED last night to be evaluated, but left AMA. She returns today with same symptoms.    She reports that she was on methimazole for her hyperthyroidism for 5 years through 10/2019. At that time, it was stopped d/t liver toxicity. She has since been only taking atenolol. She has a current recommendation and schedule for thyroidectomy/ablation with her PCP team in [place], but she questions that opinion.    ED Course: LFTs and bilirubin were elevated. CT ab/pelvis was negative. TSH was undetectable low. FT4 was elevated at >5.5. Transfer was attempted to [facility] but no beds available. [facility] was called for admission.   Hospital Course:  #1  transaminitis/elevated LFTs -Patient had presented with a significant transaminitis with AST of 880, ALT of 635, bilirubin of 28.5.  CT abdomen and pelvis was negative for any acute abnormalities done. -Right upper quadrant ultrasound done was also negative. -Patient also noted to have LFTs elevated with similar results over a year ago with no significant change. -Patient noted to have had an extensive work-up in the past with similar presentation in September 2021 including liver biopsy which at that time showed concerns for drug-induced liver injury which was felt probably from the methimazole which patient stated has discontinued. -Acute hepatitis panel done during this hospitalization was negative. -Some improvement with LFTs by day of discharge. -Patient seen in consultation by GI, Dr. Alessandra Frey who recommended starting prednisone 20 mg daily x2 weeks followed by prednisone 10 mg daily times another 2 weeks for possible drug-induced liver injury if patient was noted to be compliant. -Patient noted to be noncompliant with staff during the hospitalization. -Patient noted to have a history of noncompliance in the past and per GI has refused follow-up some blood works in the past and has been dismissed from [facility]. -Per GI no further inpatient GI work-up planned and recommending outpatient follow-up with [facility] or liver clinic in [place] when patient goes back to [place]. -Patient discharged in stable condition.  2.  Dehydration -Patient hydrated with IV fluids.  3.  Hyperthyroidism/Graves' disease -Patient noted to have a prior history of hypothyroidism noted to have previously been on methimazole for 5 years and was felt was etiology of patient's liver toxicity and patient has not been  on that medication over a year. -TSH obtained was undetectably low, T4 > 5.5. -Patient noted to be on atenolol for symptom control however stated felt was not controlling her  tachycardia/palpitations. -Patient refused telemetry during the hospitalization. -Methimazole and PTU carry possibility of liver toxicity, given patient already liver failure would not be good options. -Admitting physician Dr. Marylyn Frey spoke with Endo at [facility] (Dr Mary Frey) and was found no good long-term options other than definite surgical treatment and resumption of beta-blockade.  Could consider short-term steroids. -Patient already started on short-term steroids as recommended by GI secondary to problem #1. -Atenolol dose increased to 50 mg twice daily which patient will be discharged home on. -Outpatient follow-up with primary endocrinologist. -Patient did state had a recommendation to have her thyroid removed with the group that she follows in [place] which patient is supposed to follow-up with.  4.  Leukocytosis -Patient with complaints of diarrhea that she still has been ongoing x3 weeks. -CT abdomen and pelvis with no acute abnormalities. -Chest x-ray negative. -Patient with no urinary symptoms. -Urinalysis nitrite negative, leukocytes negative. -Leukocytosis trended down during this hospitalization. -No further work-up antibiotics needed.  5.  Abnormal EKG -Patient noted to have some diffuse ST elevations concerning for possible pericarditis. -EDP consulted with cardiology who reviewed EKG and felt likely more early repolarization. -2D echo obtained with normal EF, NWMA. -Patient refused telemetry during the hospitalization. -No further cardiac work-up needed.  6.  Diarrhea -Patient presented with complaints of diarrhea over 3 weeks. -CT abdomen and pelvis unremarkable. -Patient had no diarrhea during the hospitalization and had formed stool as noted per staff. -Patient denied to GI any diarrhea or melanotic stools or abdominal pain and feeling better on day of discharge. -No further work-up needed.  Procedures: CT abdomen and pelvis 05/12/2021 Chest x-ray 05/11/2021 Right  upper quadrant ultrasound 05/12/2021 2D echo 05/12/2021      Consultations: Gastroenterology: Dr. Alessandra Frey 05/13/2021  Discharge Exam: Vitals:   05/13/21 0511 05/13/21 0838  BP: (!) 149/86 129/86  Pulse: (!) 122 78  Resp: 17 (!) 21  Temp: 98 F (36.7 C) 98.5 F (36.9 C)  SpO2: 100% 100%    General: Scleral icterus.  NAD. Cardiovascular: Regular rate rhythm no murmurs rubs or gallops.  No JVD.  No lower extremity edema. Respiratory: Clear to auscultation bilaterally.  No wheezes, no crackles, no rhonchi.  Discharge Instructions   Discharge Instructions     Diet - low sodium heart healthy   Complete by: As directed    Increase activity slowly   Complete by: As directed       Allergies as of 05/13/2021       Reactions   Latex Itching, Rash, Other (See Comments)   Reaction Type: Allergy   Methimazole Diarrhea   Liver toxicity   Ptu [propylthiouracil] Other (See Comments)   Liver toxicity   Hydrocodone Nausea And Vomiting   Hydrocodone-acetaminophen Other (See Comments)   Reaction Type: Allergy        Medication List     STOP taking these medications    methocarbamol 500 MG tablet Commonly known as: ROBAXIN       TAKE these medications    atenolol 50 MG tablet Commonly known as: TENORMIN Take 1 tablet (50 mg total) by mouth 2 (two) times daily. What changed:  medication strength how much to take   cholestyramine light 4 g packet Commonly known as: PREVALITE Take 1 packet (4 g total) by mouth 2 (two) times daily. Mix  with 4-6 oz liquid.  Take other meds 1 hr before or 4-6 hr after cholestyramine.   diphenoxylate-atropine 2.5-0.025 MG tablet Commonly known as: LOMOTIL Take 1 tablet by mouth 4 (four) times daily as needed for up to 20 doses for diarrhea or loose stools.   fenofibrate 54 MG tablet Take 54 mg by mouth daily.   FeroSul 325 (65 FE) MG tablet Generic drug: ferrous sulfate Take 325 mg by mouth daily.   fluticasone 50 MCG/ACT  nasal spray Commonly known as: FLONASE Place 2 sprays into both nostrils daily.   guaiFENesin 600 MG 12 hr tablet Commonly known as: MUCINEX Take 2 tablets (1,200 mg total) by mouth 2 (two) times daily for 5 days.   hydrochlorothiazide 25 MG tablet Commonly known as: HYDRODIURIL Take 25 mg by mouth 3 (three) times daily.   hydrOXYzine 25 MG tablet Commonly known as: ATARAX Take 1 tablet (25 mg total) by mouth every 8 (eight) hours as needed for itching or anxiety. What changed:  when to take this reasons to take this   loratadine 10 MG tablet Commonly known as: CLARITIN Take 1 tablet (10 mg total) by mouth daily. Start taking on: May 14, 2021   melatonin 5 MG Tabs Take 1 tablet (5 mg total) by mouth at bedtime.   MULTIVITAMIN ADULTS PO Take 1 tablet by mouth daily.   naproxen 500 MG tablet Commonly known as: NAPROSYN Take 500 mg by mouth 2 (two) times daily as needed.   predniSONE 10 MG tablet Commonly known as: DELTASONE Take 2 tablets (20 mg total) by mouth daily with breakfast for 14 days, THEN 1 tablet (10 mg total) daily with breakfast for 14 days. Start taking on: May 14, 2021   triamcinolone cream 0.1 % Commonly known as: KENALOG Apply 1 application topically 2 (two) times daily.   valsartan 80 MG tablet Commonly known as: DIOVAN Take 80 mg by mouth daily.       Allergies  Allergen Reactions   Latex Itching, Rash and Other (See Comments)    Reaction Type: Allergy   Methimazole Diarrhea    Liver toxicity    Ptu [Propylthiouracil] Other (See Comments)    Liver toxicity    Hydrocodone Nausea And Vomiting   Hydrocodone-Acetaminophen Other (See Comments)    Reaction Type: Allergy    Follow-up Information     [facility]. Schedule an appointment as soon as possible for a visit in 1 week(s).   Why: f/u in 1-2 weeks.        Endocrinology Follow up.   Why: Follow-up as scheduled.                 The results of  significant diagnostics from this hospitalization (including imaging, microbiology, ancillary and laboratory) are listed below for reference.    Significant Diagnostic Studies: CT Abdomen Pelvis W Contrast  Result Date: 05/12/2021 CLINICAL DATA:  Hepatitis, lower abdominal pain EXAM: CT ABDOMEN AND PELVIS WITH CONTRAST TECHNIQUE: Multidetector CT imaging of the abdomen and pelvis was performed using the standard protocol following bolus administration of intravenous contrast. RADIATION DOSE REDUCTION: This exam was performed according to the departmental dose-optimization program which includes automated exposure control, adjustment of the mA and/or kV according to patient size and/or use of iterative reconstruction technique. CONTRAST:  160mL OMNIPAQUE IOHEXOL 300 MG/ML  SOLN COMPARISON:  10/28/2019 FINDINGS: Lower chest: No acute abnormality. Hepatobiliary: No solid liver abnormality is seen. No gallstones, gallbladder wall thickening, or biliary dilatation. Pancreas: Unremarkable. No  pancreatic ductal dilatation or surrounding inflammatory changes. Spleen: Normal in size without significant abnormality. Adrenals/Urinary Tract: Adrenal glands are unremarkable. Kidneys are normal, without renal calculi, solid lesion, or hydronephrosis. Bladder is unremarkable. Stomach/Bowel: Stomach is within normal limits. Appendix appears normal. No evidence of bowel wall thickening, distention, or inflammatory changes. Vascular/Lymphatic: No significant vascular findings are present. No enlarged abdominal or pelvic lymph nodes. Reproductive: Heterogeneously hypoenhancing uterine fibroids. Other: No abdominal wall hernia or abnormality. No ascites. Musculoskeletal: No acute or significant osseous findings. IMPRESSION: 1. No acute CT findings of the abdomen or pelvis to explain pain. Specifically, no CT abnormality of the liver per indication of hepatitis. 2. Uterine fibroids. Electronically Signed   By: Delanna Ahmadi M.D.    On: 05/12/2021 15:11   DG Chest Port 1 View  Result Date: 05/11/2021 CLINICAL DATA:  Nausea and vomiting. EXAM: PORTABLE CHEST 1 VIEW COMPARISON:  Chest x-ray 03/15/2015. FINDINGS: The heart size and mediastinal contours are within normal limits. Both lungs are clear. The visualized skeletal structures are unremarkable. IMPRESSION: No active disease. Electronically Signed   By: Ronney Asters M.D.   On: 05/11/2021 20:18   ECHOCARDIOGRAM COMPLETE  Result Date: 05/12/2021    ECHOCARDIOGRAM REPORT   Patient Name:   Mary Frey Date of Exam: 05/12/2021 Medical Rec #:  [id number]        Height:       65.0 in Accession #:    [id number]       Weight:       131.0 lb Date of Birth:  31-May-1975         BSA:          1.653 m Patient Age:    27 years         BP:           112/73 mmHg Patient Gender: F                HR:           99 bpm. Exam Location:  Inpatient Procedure: 2D Echo, Cardiac Doppler and Color Doppler Indications:    Chest Pain R07.9  History:        Patient has prior history of Echocardiogram examinations, most                 recent 01/12/2013. Signs/Symptoms:Chest Pain. Thyroid disease.                 Liver disease.  Sonographer:    Merrie Roof RDCS Referring Phys: [id number] [place]  1. Left ventricular ejection fraction, by estimation, is 60 to 65%. The left ventricle has normal function. The left ventricle has no regional wall motion abnormalities. Left ventricular diastolic parameters were normal.  2. Right ventricular systolic function is normal. The right ventricular size is normal.  3. The mitral valve is abnormal. Mild mitral valve regurgitation. No evidence of mitral stenosis.  4. Small gradient across LVOT/AV no obvious sub aortic web and valve appears to open well there is some chordal SAM and hyperdynamic EF which may contribute . The aortic valve is tricuspid. Aortic valve regurgitation is not visualized. No aortic stenosis is present.  5. The inferior vena cava is normal  in size with greater than 50% respiratory variability, suggesting right atrial pressure of 3 mmHg. FINDINGS  Left Ventricle: Left ventricular ejection fraction, by estimation, is 60 to 65%. The left ventricle has normal function. The left ventricle has no regional wall motion abnormalities.  The left ventricular internal cavity size was normal in size. There is  no left ventricular hypertrophy. Left ventricular diastolic parameters were normal. Right Ventricle: The right ventricular size is normal. No increase in right ventricular wall thickness. Right ventricular systolic function is normal. Left [facility]: Left atrial size was normal in size. Right [facility]: Right atrial size was normal in size. Pericardium: There is no evidence of pericardial effusion. Mitral Valve: The mitral valve is abnormal. There is mild thickening of the mitral valve leaflet(s). There is mild calcification of the mitral valve leaflet(s). Mild mitral annular calcification. Mild mitral valve regurgitation. No evidence of mitral valve stenosis. Tricuspid Valve: The tricuspid valve is normal in structure. Tricuspid valve regurgitation is mild . No evidence of tricuspid stenosis. Aortic Valve: Small gradient across LVOT/AV no obvious sub aortic web and valve appears to open well there is some chordal SAM and hyperdynamic EF which may contribute. The aortic valve is tricuspid. Aortic valve regurgitation is not visualized. No aortic stenosis is present. Aortic valve mean gradient measures 9.0 mmHg. Aortic valve peak gradient measures 16.2 mmHg. Aortic valve area, by VTI measures 2.61 cm. Pulmonic Valve: The pulmonic valve was normal in structure. Pulmonic valve regurgitation is mild. No evidence of pulmonic stenosis. Aorta: The aortic root is normal in size and structure. Venous: The inferior vena cava is normal in size with greater than 50% respiratory variability, suggesting right atrial pressure of 3 mmHg. IAS/Shunts: No atrial level shunt detected  by color flow Doppler.  LEFT VENTRICLE PLAX 2D LVIDd:         3.40 cm   Diastology LVIDs:         2.20 cm   LV e' medial:    12.10 cm/s LV PW:         1.00 cm   LV E/e' medial:  6.4 LV IVS:        1.00 cm   LV e' lateral:   9.68 cm/s LVOT diam:     2.00 cm   LV E/e' lateral: 8.0 LV SV:         77 LV SV Index:   47 LVOT Area:     3.14 cm  RIGHT VENTRICLE RV Basal diam:  3.40 cm LEFT ATRIUM             Index        RIGHT ATRIUM           Index LA diam:        3.40 cm 2.06 cm/m   RA Area:     14.30 cm LA Vol (A2C):   37.9 ml 22.93 ml/m  RA Volume:   40.70 ml  24.63 ml/m LA Vol (A4C):   48.7 ml 29.47 ml/m LA Biplane Vol: 47.3 ml 28.62 ml/m  AORTIC VALVE AV Area (Vmax):    2.52 cm AV Area (Vmean):   2.34 cm AV Area (VTI):     2.61 cm AV Vmax:           201.00 cm/s AV Vmean:          138.000 cm/s AV VTI:            0.296 m AV Peak Grad:      16.2 mmHg AV Mean Grad:      9.0 mmHg LVOT Vmax:         161.00 cm/s LVOT Vmean:        103.000 cm/s LVOT VTI:  0.246 m LVOT/AV VTI ratio: 0.83  AORTA Ao Root diam: 2.70 cm Ao Asc diam:  2.80 cm MITRAL VALVE MV Area (PHT): 3.93 cm    SHUNTS MV Decel Time: 193 msec    Systemic VTI:  0.25 m MV E velocity: 77.60 cm/s  Systemic Diam: 2.00 cm MV A velocity: 75.00 cm/s MV E/A ratio:  1.03 Jenkins Rouge MD Electronically signed by Jenkins Rouge MD Signature Date/Time: 05/12/2021/2:57:32 PM    Final    US Abdomen Limited RUQ (LIVER/GB)  Result Date: 05/12/2021 CLINICAL DATA:  Elevated LFTs EXAM: ULTRASOUND ABDOMEN LIMITED RIGHT UPPER QUADRANT COMPARISON:  Abdominal ultrasound 10/19/2019 FINDINGS: Study is limited due to patient breathing motion. Gallbladder: No gallstones or wall thickening visualized. Intraluminal low-level echoes likely represent sludge and artifacts. No sonographic Murphy sign noted by sonographer. Common bile duct: Diameter: 2 mm Liver: No focal lesion identified. Within normal limits in parenchymal echogenicity. Portal vein is patent on color Doppler  imaging with normal direction of blood flow towards the liver. Other: None. IMPRESSION: No acute process identified.  Gallbladder sludge. Electronically Signed   By: Ofilia Neas M.D.   On: 05/12/2021 16:58    Microbiology: Recent Results (from the past 240 hour(s))  Resp Panel by RT-PCR (Flu A&B, Covid) Nasopharyngeal Swab     Status: None   Collection Time: 05/11/21 11:14 PM   Specimen: Nasopharyngeal Swab; Nasopharyngeal(NP) swabs in vial transport medium  Result Value Ref Range Status   SARS Coronavirus 2 by RT PCR NEGATIVE NEGATIVE Final    Comment: (NOTE) SARS-CoV-2 target nucleic acids are NOT DETECTED.  The SARS-CoV-2 RNA is generally detectable in upper respiratory specimens during the acute phase of infection. The lowest concentration of SARS-CoV-2 viral copies this assay can detect is 138 copies/mL. A negative result does not preclude SARS-Cov-2 infection and should not be used as the sole basis for treatment or other patient management decisions. A negative result may occur with  improper specimen collection/handling, submission of specimen other than nasopharyngeal swab, presence of viral mutation(s) within the areas targeted by this assay, and inadequate number of viral copies(<138 copies/mL). A negative result must be combined with clinical observations, patient history, and epidemiological information. The expected result is Negative.  Fact Sheet for Patients:  EntrepreneurPulse.com.au  Fact Sheet for Healthcare Providers:  IncredibleEmployment.be  This test is no t yet approved or cleared by the [place] FDA and  has been authorized for detection and/or diagnosis of SARS-CoV-2 by FDA under an Emergency Use Authorization (EUA). This EUA will remain  in effect (meaning this test can be used) for the duration of the COVID-19 declaration under Section 564(b)(1) of the Act, 21 U.S.C.section 360bbb-3(b)(1), unless the  authorization is terminated  or revoked sooner.       Influenza A by PCR NEGATIVE NEGATIVE Final   Influenza B by PCR NEGATIVE NEGATIVE Final    Comment: (NOTE) The Xpert Xpress SARS-CoV-2/FLU/RSV plus assay is intended as an aid in the diagnosis of influenza from Nasopharyngeal swab specimens and should not be used as a sole basis for treatment. Nasal washings and aspirates are unacceptable for Xpert Xpress SARS-CoV-2/FLU/RSV testing.  Fact Sheet for Patients: EntrepreneurPulse.com.au  Fact Sheet for Healthcare Providers: IncredibleEmployment.be  This test is not yet approved or cleared by the [place] FDA and has been authorized for detection and/or diagnosis of SARS-CoV-2 by FDA under an Emergency Use Authorization (EUA). This EUA will remain in effect (meaning this test can be used) for the duration of  the COVID-19 declaration under Section 564(b)(1) of the Act, 21 U.S.C. section 360bbb-3(b)(1), unless the authorization is terminated or revoked.  Performed at [facility], [place] [address], [place], [place] [address]   Gastrointestinal Panel by PCR , Stool     Status: None   Collection Time: 05/12/21  3:53 PM   Specimen: Stool  Result Value Ref Range Status   Campylobacter species NOT DETECTED NOT DETECTED Final   Plesimonas shigelloides NOT DETECTED NOT DETECTED Final   Salmonella species NOT DETECTED NOT DETECTED Final   Yersinia enterocolitica NOT DETECTED NOT DETECTED Final   Vibrio species NOT DETECTED NOT DETECTED Final   Vibrio cholerae NOT DETECTED NOT DETECTED Final   Enteroaggregative E coli (EAEC) NOT DETECTED NOT DETECTED Final   Enteropathogenic E coli (EPEC) NOT DETECTED NOT DETECTED Final   Enterotoxigenic E coli (ETEC) NOT DETECTED NOT DETECTED Final   Shiga like toxin producing E coli (STEC) NOT DETECTED NOT DETECTED Final   Shigella/Enteroinvasive E coli (EIEC) NOT DETECTED NOT DETECTED Final    Cryptosporidium NOT DETECTED NOT DETECTED Final   Cyclospora cayetanensis NOT DETECTED NOT DETECTED Final   Entamoeba histolytica NOT DETECTED NOT DETECTED Final   Giardia lamblia NOT DETECTED NOT DETECTED Final   Adenovirus F40/41 NOT DETECTED NOT DETECTED Final   Astrovirus NOT DETECTED NOT DETECTED Final   Norovirus GI/GII NOT DETECTED NOT DETECTED Final   Rotavirus A NOT DETECTED NOT DETECTED Final   Sapovirus (I, II, IV, and V) NOT DETECTED NOT DETECTED Final    Comment: Performed at [facility], [address] [place], [place] [address]     Labs: Basic Metabolic Panel: Recent Labs  Lab 05/11/21 2023 05/13/21 0502  NA 134* 138  K 4.7 3.7  CL 107 109  CO2 19* 19*  GLUCOSE 115* 79  BUN 23* 17  CREATININE 0.46 <0.30*  CALCIUM 10.3 9.6   Liver Function Tests: Recent Labs  Lab 05/11/21 2023 05/13/21 0502  AST 880* 559*  ALT 635* 460*  ALKPHOS 203* 168*  BILITOT 28.5* 28.2*  PROT 8.7* 7.1  ALBUMIN 3.1* 2.6*   Recent Labs  Lab 05/11/21 2023  LIPASE 41   No results for input(s): AMMONIA in the last 168 hours. CBC: Recent Labs  Lab 05/11/21 2023 05/13/21 0502  WBC 15.6* 10.7*  NEUTROABS 12.9*  --   HGB 12.4 10.7*  HCT 33.6* 29.2*  MCV 68.4* 68.5*  PLT 410* 333   Cardiac Enzymes: Recent Labs  Lab 05/12/21 1328  CKTOTAL 26*   BNP: BNP (last 3 results) No results for input(s): BNP in the last 8760 hours.  ProBNP (last 3 results) No results for input(s): PROBNP in the last 8760 hours.  CBG: No results for input(s): GLUCAP in the last 168 hours.     Signed:  Irine Seal MD.  [facility] 05/13/2021, 3:30 PM

## 2021-05-14 LAB — EPSTEIN-BARR VIRUS (EBV) ANTIBODY PROFILE
EBV NA IgG: 189 U/mL — ABNORMAL HIGH (ref 0.0–17.9)
EBV VCA IgG: >600 U/mL — ABNORMAL HIGH (ref 0.0–17.9)
EBV VCA IgM: <36 U/mL (ref 0.0–35.9)

## 2021-05-16 LAB — OVA + PARASITE EXAM

## 2021-05-16 LAB — O&P RESULT

## 2021-05-18 LAB — CULTURE, BLOOD (ROUTINE X 2)
Culture: NO GROWTH
Culture: NO GROWTH
Special Requests: ADEQUATE
Special Requests: ADEQUATE

## 2021-06-27 ENCOUNTER — Encounter: Payer: Self-pay | Admitting: Oncology

## 2022-07-18 IMAGING — US US ABDOMEN LIMITED
1 series · 15 of 25 positions shown · non-contrast
Comparison: Abdominal ultrasound 10/19/2019

CLINICAL DATA: Elevated LFTs

EXAM:
ULTRASOUND ABDOMEN LIMITED RIGHT UPPER QUADRANT

[Series 1: us abdomen limited ruq mc & wl · 15 of 68 slices shown]
[im 1/68]
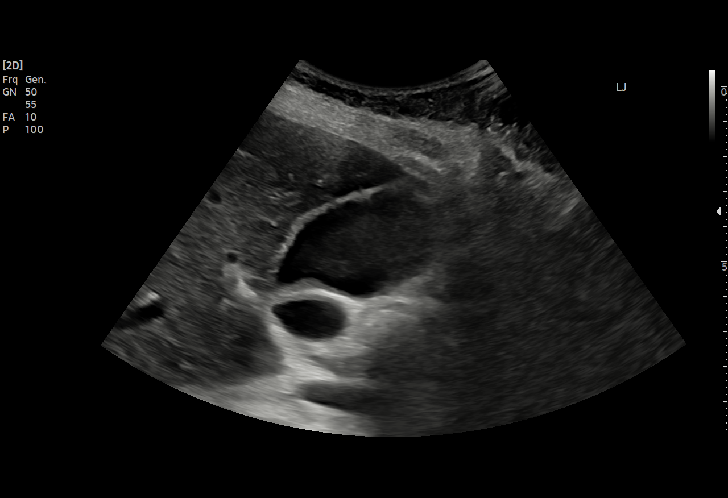
[im 6/68]
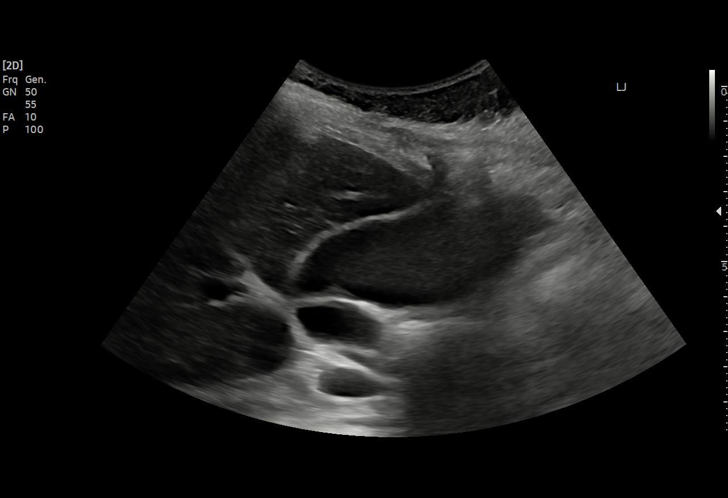
[im 12/68]
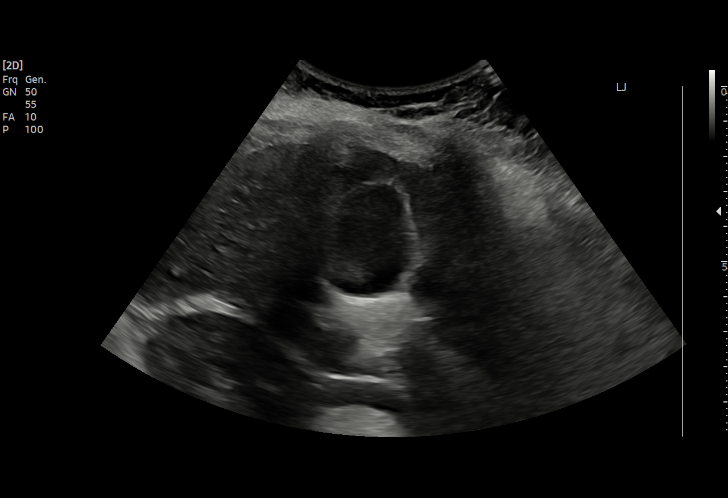
[im 14/68]
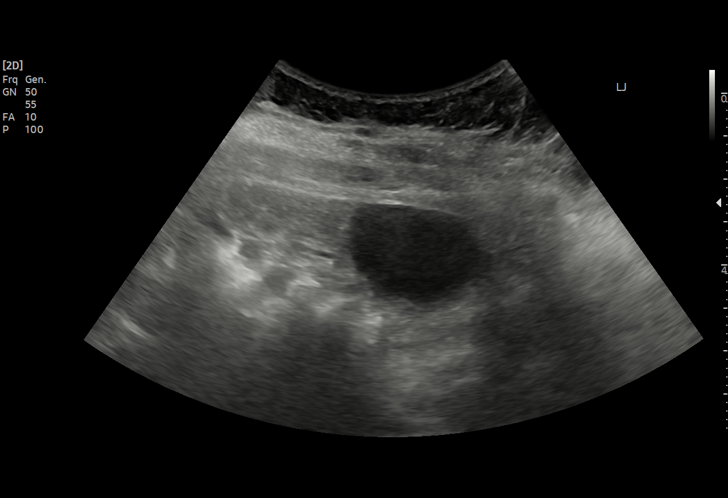
[im 20/68]
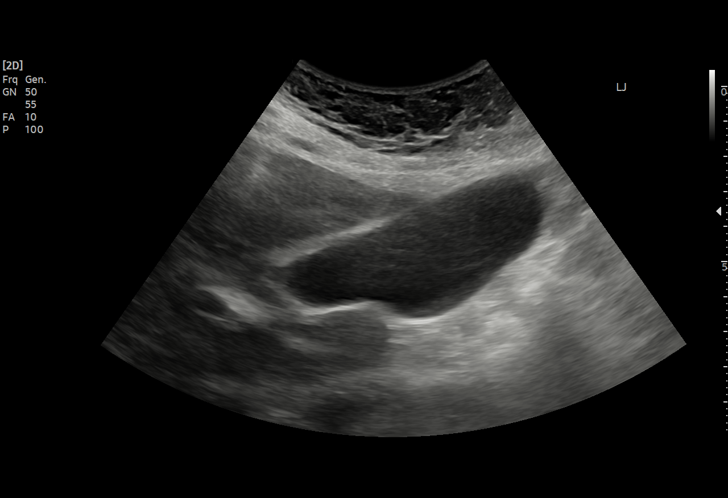
[im 26/68]
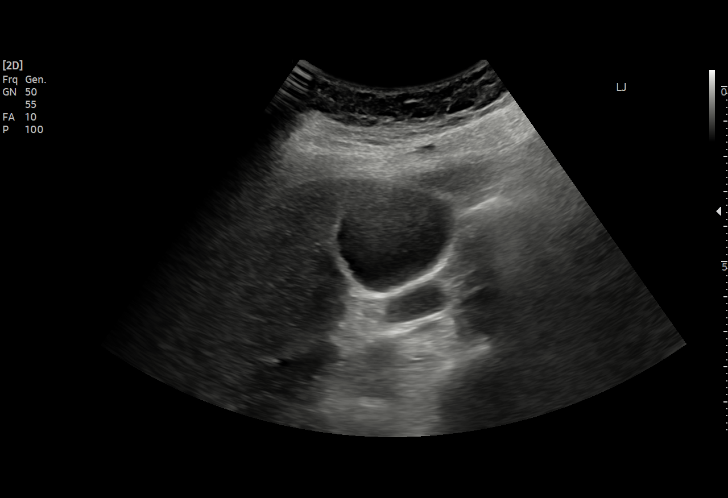
[im 28/68]
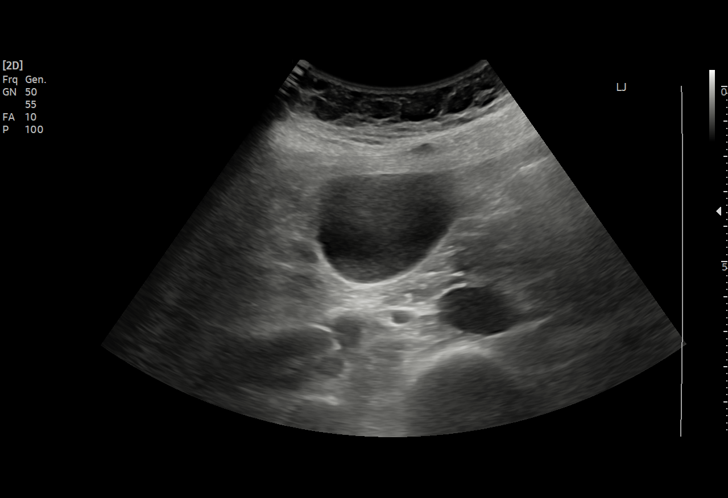
[im 34/68]
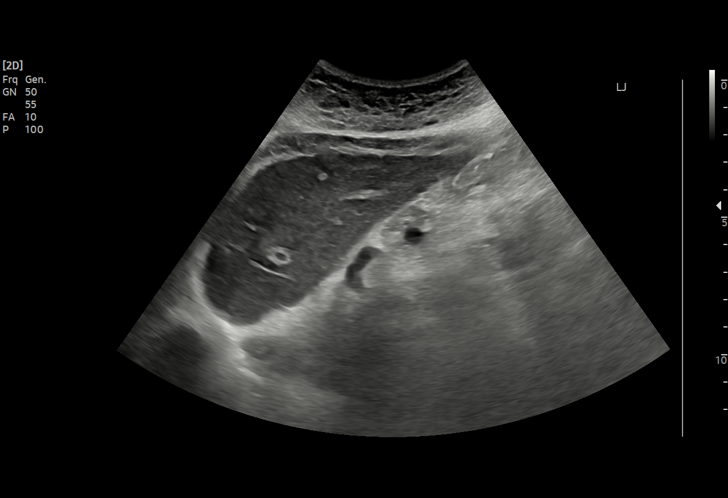
[im 40/68]
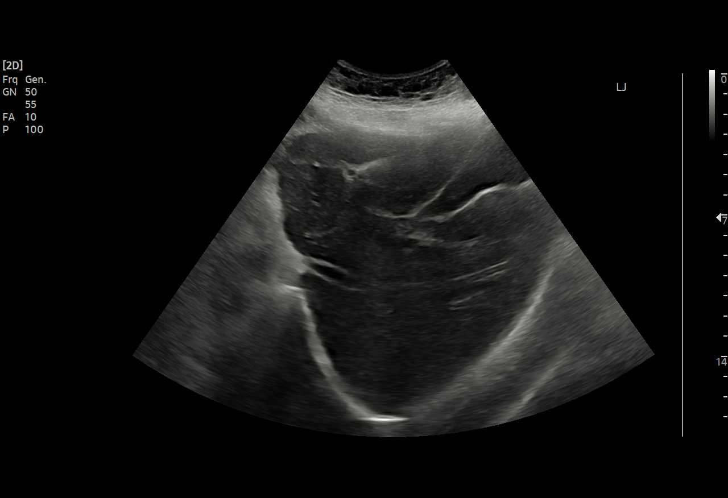
[im 42/68]
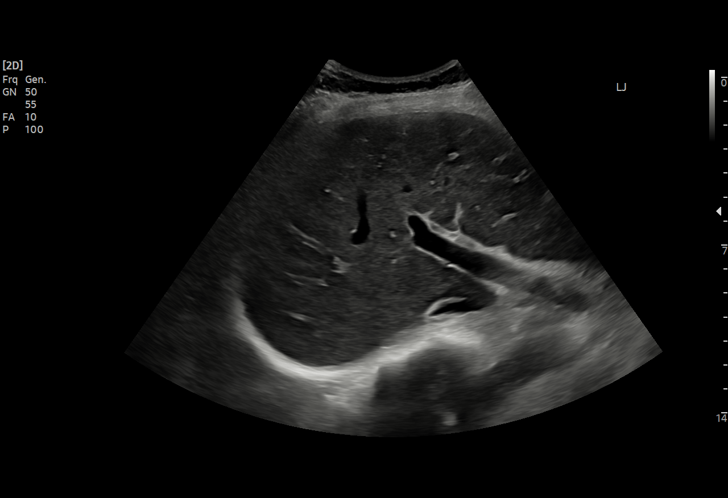
[im 48/68]
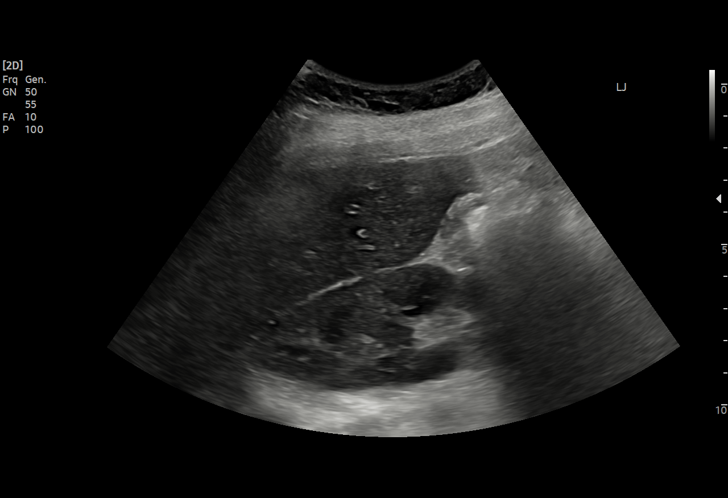
[im 54/68]
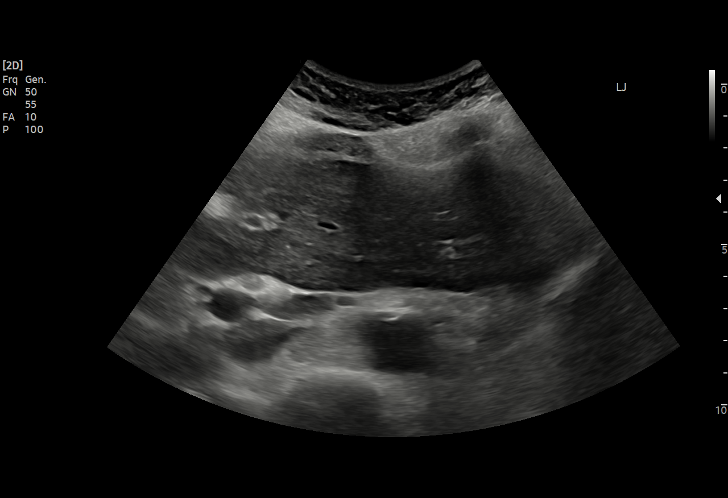
[im 56/68]
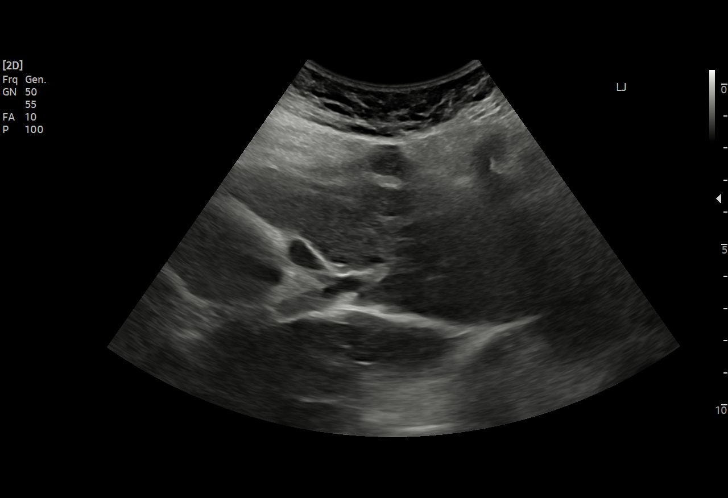
[im 62/68]
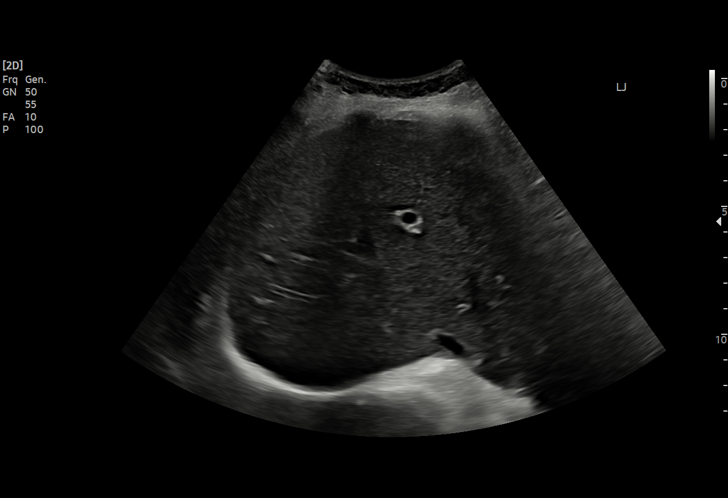
[im 68/68]
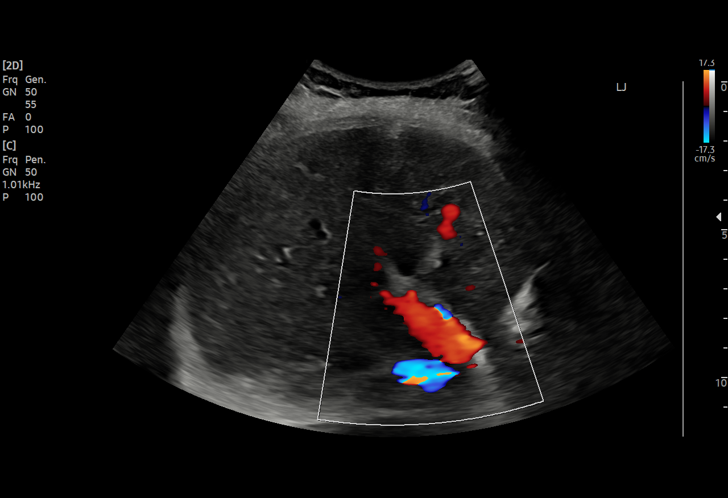

[15 of 25 positions shown; findings below may reference images not displayed]

FINDINGS: Study is limited due to patient breathing motion.

Gallbladder:

No gallstones or wall thickening visualized. Intraluminal low-level
echoes likely represent sludge and artifacts. No sonographic Murphy
sign noted by sonographer.

Common bile duct:

Diameter: 2 mm

Liver:

No focal lesion identified. Within normal limits in parenchymal
echogenicity. Portal vein is patent on color Doppler imaging with
normal direction of blood flow towards the liver.

Other: None.
IMPRESSION: No acute process identified.  Gallbladder sludge.

## 2022-07-18 IMAGING — CT CT ABD-PELV W/ CM
2 of 5 series · 16 of 46 positions shown, 18 images · IV contrast (agent unspecified)
Comparison: 10/28/2019

CLINICAL DATA: Hepatitis, lower abdominal pain

EXAM:
CT ABDOMEN AND PELVIS WITH CONTRAST
TECHNIQUE: Multidetector CT imaging of the abdomen and pelvis was performed
using the standard protocol following bolus administration of
intravenous contrast.

[Series 2: axial st · axial · 0.68mm/px · z∈[+1041,+1401]mm · 13 of 86 slices shown, 15 images]
[im 7/86  soft-tissue]
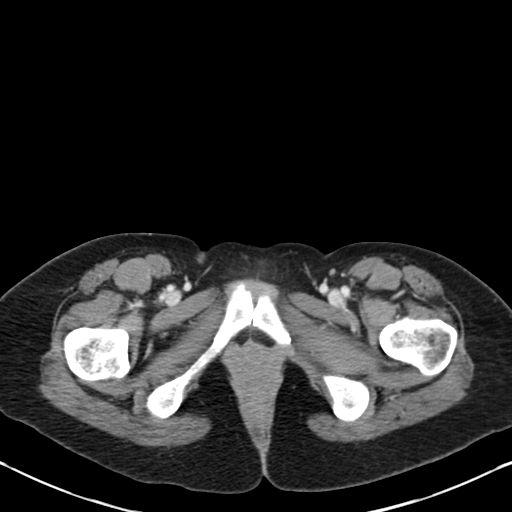
[im 7/86  bone]
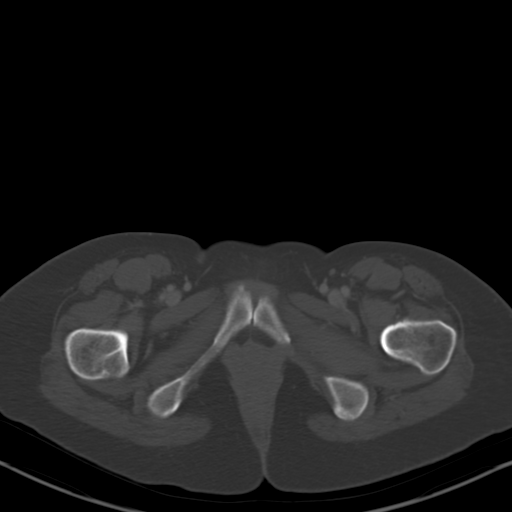
[im 13/86  soft-tissue]
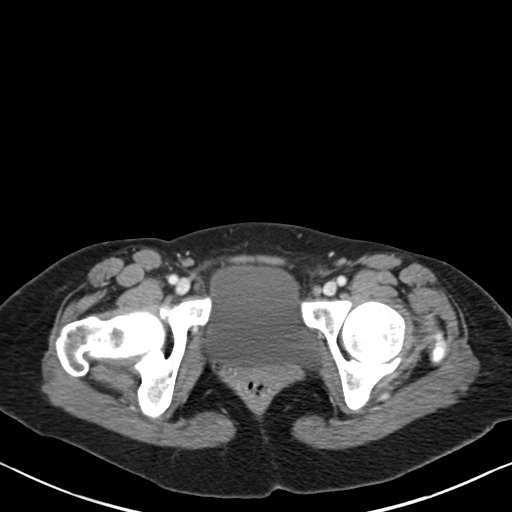
[im 19/86  soft-tissue]
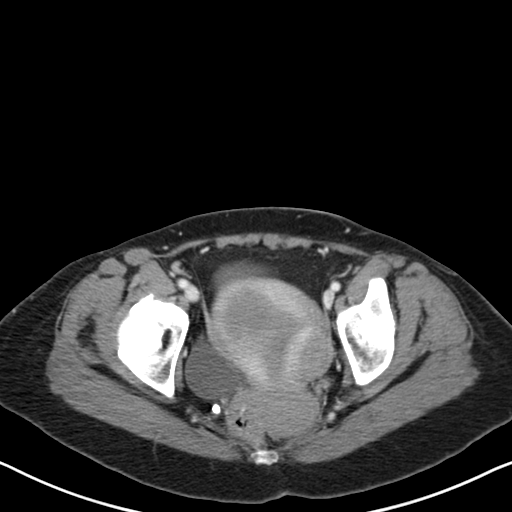
[im 25/86  soft-tissue]
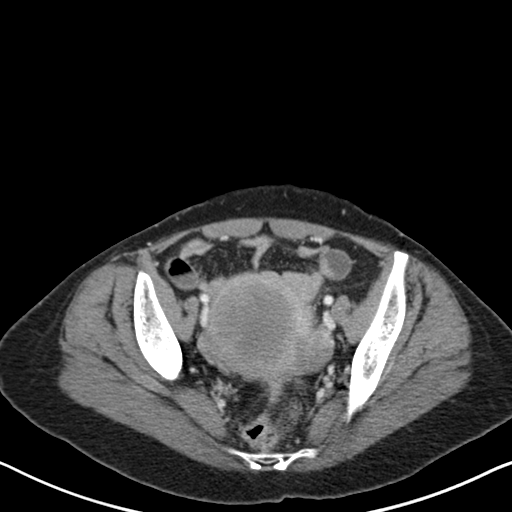
[im 31/86  soft-tissue]
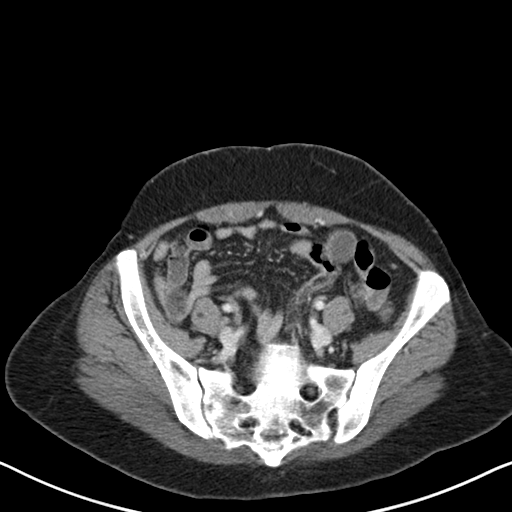
[im 37/86  soft-tissue]
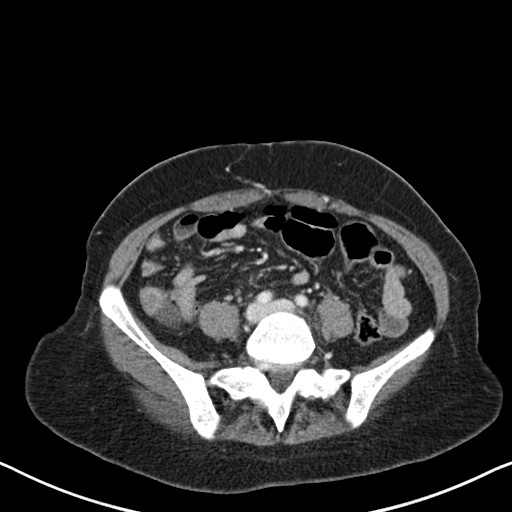
[im 43/86  soft-tissue]
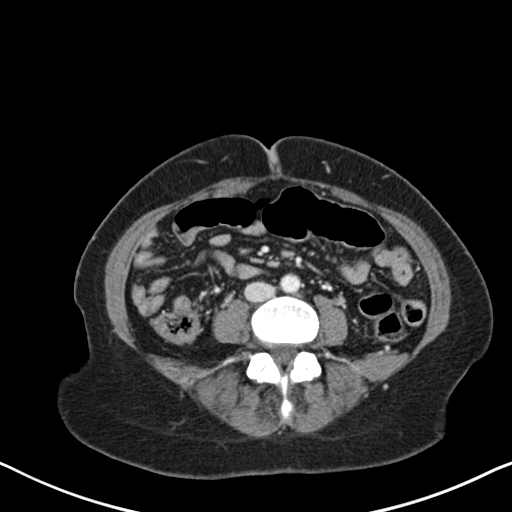
[im 49/86  soft-tissue]
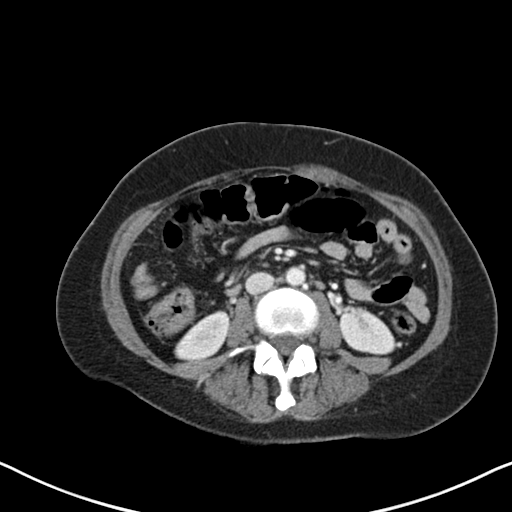
[im 55/86  soft-tissue]
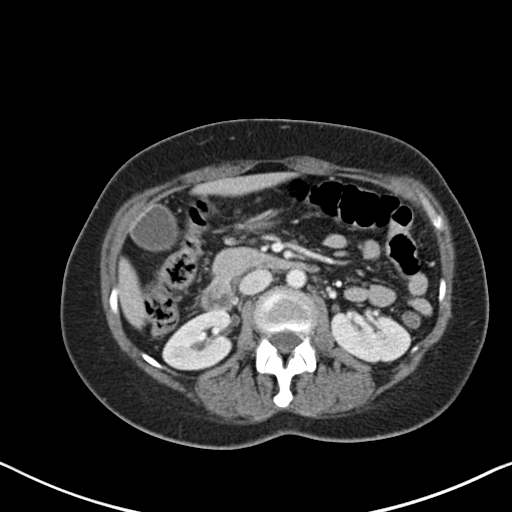
[im 55/86  bone]
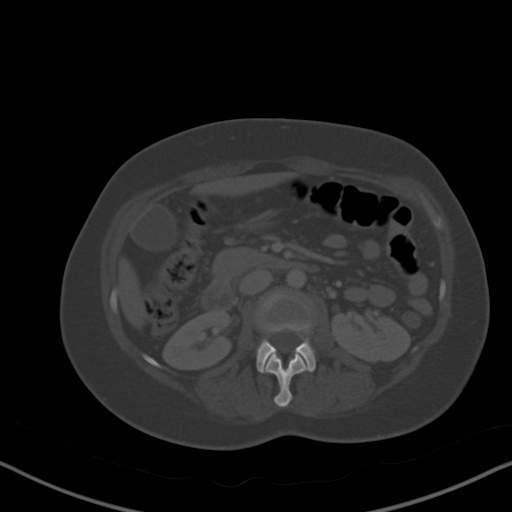
[im 61/86  soft-tissue]
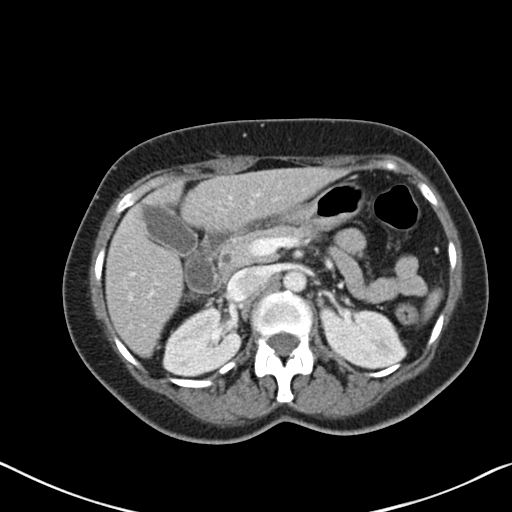
[im 67/86  soft-tissue]
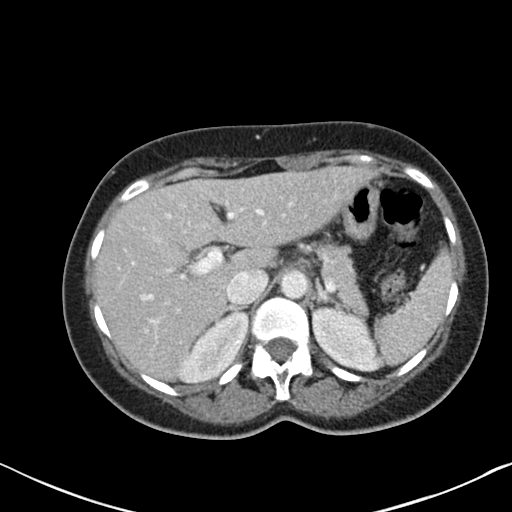
[im 73/86  soft-tissue]
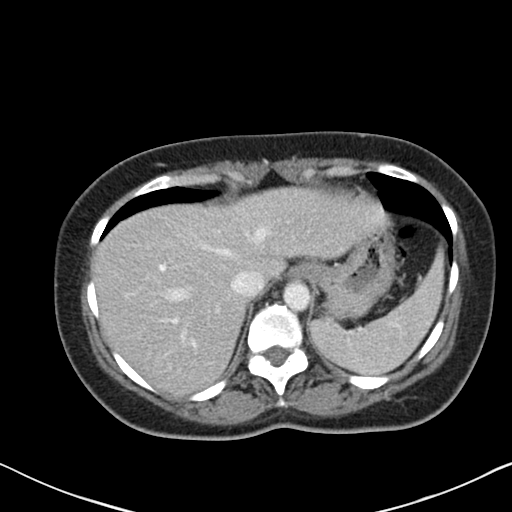
[im 79/86  soft-tissue]
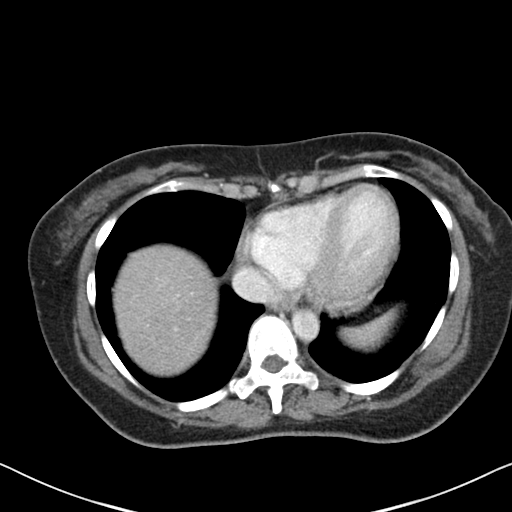

[Series 5: coronal st · coronal · 0.82mm/px · 3 of 134 slices shown]
[im 45/134  soft-tissue]
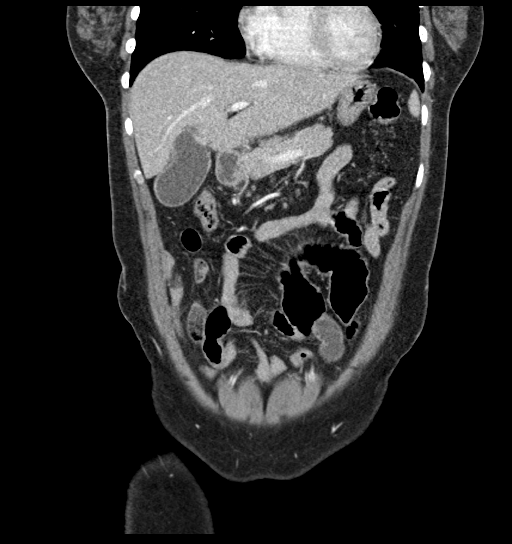
[im 60/134  soft-tissue]
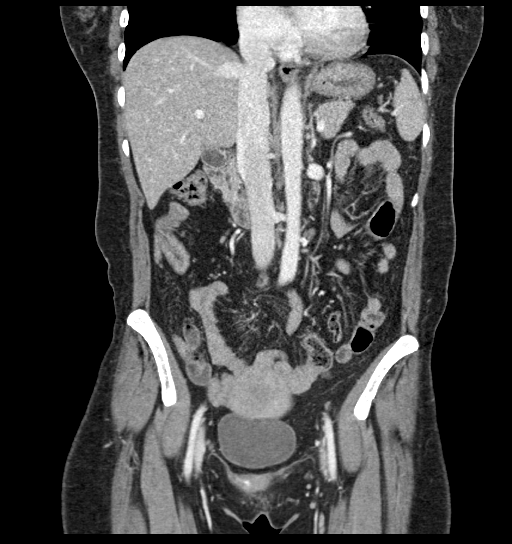
[im 74/134  soft-tissue]
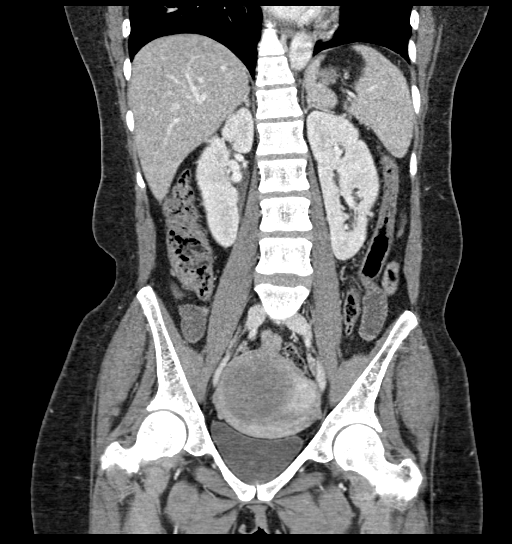

[16 of 46 positions shown; findings below may reference images not displayed]

RADIATION DOSE REDUCTION: This exam was performed according to the
departmental dose-optimization program which includes automated
exposure control, adjustment of the mA and/or kV according to
patient size and/or use of iterative reconstruction technique.

CONTRAST:  100mL OMNIPAQUE IOHEXOL 300 MG/ML  SOLN
FINDINGS: Lower chest: No acute abnormality.

Hepatobiliary: No solid liver abnormality is seen. No gallstones,
gallbladder wall thickening, or biliary dilatation.

Pancreas: Unremarkable. No pancreatic ductal dilatation or
surrounding inflammatory changes.

Spleen: Normal in size without significant abnormality.

Adrenals/Urinary Tract: Adrenal glands are unremarkable. Kidneys are
normal, without renal calculi, solid lesion, or hydronephrosis.
Bladder is unremarkable.

Stomach/Bowel: Stomach is within normal limits. Appendix appears
normal. No evidence of bowel wall thickening, distention, or
inflammatory changes.

Vascular/Lymphatic: No significant vascular findings are present. No
enlarged abdominal or pelvic lymph nodes.

Reproductive: Heterogeneously hypoenhancing uterine fibroids.

Other: No abdominal wall hernia or abnormality. No ascites.

Musculoskeletal: No acute or significant osseous findings.
IMPRESSION: 1. No acute CT findings of the abdomen or pelvis to explain pain.
Specifically, no CT abnormality of the liver per indication of
hepatitis.
2. Uterine fibroids.

## 2022-08-13 ENCOUNTER — Emergency Department (HOSPITAL_COMMUNITY): Payer: Medicaid - Out of State

## 2022-08-13 ENCOUNTER — Other Ambulatory Visit: Payer: Self-pay

## 2022-08-13 ENCOUNTER — Emergency Department (HOSPITAL_COMMUNITY)
Admission: EM | Admit: 2022-08-13 | Discharge: 2022-08-14 | Discharge status: Against Medical Advice | Payer: Medicaid - Out of State | Attending: Emergency Medicine | Admitting: Emergency Medicine

## 2022-08-13 DIAGNOSIS — J029 Acute pharyngitis, unspecified: Secondary | ICD-10-CM | POA: Insufficient documentation

## 2022-08-13 DIAGNOSIS — Z5321 Procedure and treatment not carried out due to patient leaving prior to being seen by health care provider: Secondary | ICD-10-CM | POA: Insufficient documentation

## 2022-08-13 DIAGNOSIS — H9202 Otalgia, left ear: Secondary | ICD-10-CM | POA: Diagnosis not present

## 2022-08-13 LAB — GROUP A STREP BY PCR: Group A Strep by PCR: NOT DETECTED

## 2022-08-13 LAB — I-STAT CHEM 8, ED
BUN: 10 mg/dL (ref 6–20)
Calcium, Ion: 1.24 mmol/L (ref 1.15–1.40)
Chloride: 109 mmol/L (ref 98–111)
Creatinine, Ser: 0.4 mg/dL — ABNORMAL LOW (ref 0.44–1.00)
Glucose, Bld: 142 mg/dL — ABNORMAL HIGH (ref 70–99)
HCT: 29 % — ABNORMAL LOW (ref 36.0–46.0)
Hemoglobin: 9.9 g/dL — ABNORMAL LOW (ref 12.0–15.0)
Potassium: 3.7 mmol/L (ref 3.5–5.1)
Sodium: 140 mmol/L (ref 135–145)
TCO2: 22 mmol/L (ref 22–32)

## 2022-08-13 LAB — I-STAT BETA HCG BLOOD, ED (MC, WL, AP ONLY): I-stat hCG, quantitative: 10.6 m[IU]/mL — ABNORMAL HIGH (ref <5)

## 2022-08-13 NOTE — ED Triage Notes (Signed)
Pt with cough, sore throat, and ear pain x 4 days. Also reports missing her period last month and is concerned for pregnancy.

## 2022-08-13 NOTE — ED Provider Triage Note (Cosign Needed)
Emergency Medicine Provider Triage Evaluation Note  Mary Frey , a 47 y.o. female  was evaluated in triage.  Pt complains of sore throat x 4 to days, unable to tolerate secretions.  Also pain in left ear..  Review of Systems  Positive: Sore throat, cough Negative: fever  Physical Exam  BP (!) 155/89 (BP Location: Right Arm)   Pulse (!) 112   Temp 98.8 F (37.1 C) (Oral)   Resp 18   SpO2 96%  Gen:   Awake, no distress   Resp:  Normal effort  MSK:   Moves extremities without difficulty  Other:  Muffled voice, spitting into trash can.  Enlarged tonsils with heavy exudate, question PTA  Medical Decision Making  Medically screening exam initiated at 6:15 PM.  Appropriate orders placed.  RAE PLOTNER was informed that the remainder of the evaluation will be completed by another provider, this initial triage assessment does not replace that evaluation, and the importance of remaining in the ED until their evaluation is complete.     Jeannie Fend, PA-C 08/13/22 1816

## 2022-08-14 ENCOUNTER — Emergency Department (HOSPITAL_COMMUNITY): Payer: Medicaid - Out of State

## 2022-08-14 MED ORDER — CLINDAMYCIN PHOSPHATE 600 MG/50ML IV SOLN
600.0000 mg | Freq: Once | INTRAVENOUS | Status: AC
  Administered 2022-08-14: 600 mg via INTRAVENOUS
  Filled 2022-08-14: qty 50

## 2022-08-14 MED ORDER — OXYCODONE-ACETAMINOPHEN 5-325 MG PO TABS
2.0000 | ORAL_TABLET | ORAL | 0 refills | Status: DC | PRN
Start: 2022-08-14 — End: 2024-02-27

## 2022-08-14 MED ORDER — CLINDAMYCIN HCL 300 MG PO CAPS: 300.0000 mg | ORAL_CAPSULE | Freq: Four times a day (QID) | ORAL | 0 refills | Status: DC

## 2022-08-14 MED ORDER — LACTATED RINGERS IV BOLUS
1000.0000 mL | Freq: Once | INTRAVENOUS | Status: AC
Start: 2022-08-14 — End: 2022-08-14
  Administered 2022-08-14: 1000 mL via INTRAVENOUS

## 2022-08-14 MED ORDER — FENTANYL CITRATE PF 50 MCG/ML IJ SOSY
50.0000 ug | PREFILLED_SYRINGE | Freq: Once | INTRAMUSCULAR | Status: DC
  Filled 2022-08-14: qty 1

## 2022-08-14 MED ORDER — OXYCODONE-ACETAMINOPHEN 5-325 MG PO TABS
1.0000 | ORAL_TABLET | Freq: Once | ORAL | Status: AC
  Administered 2022-08-14: 1 via ORAL
  Filled 2022-08-14: qty 1

## 2022-08-14 MED ORDER — DEXAMETHASONE 6 MG PO TABS
6.0000 mg | ORAL_TABLET | Freq: Every day | ORAL | 0 refills | Status: AC
Start: 2022-08-14 — End: 2022-08-17

## 2022-08-14 MED ORDER — DEXAMETHASONE SODIUM PHOSPHATE 10 MG/ML IJ SOLN
10.0000 mg | Freq: Once | INTRAMUSCULAR | Status: AC
  Administered 2022-08-14: 10 mg via INTRAVENOUS
  Filled 2022-08-14: qty 1

## 2022-08-14 NOTE — ED Notes (Signed)
Pt refusing further care given pain meds per dr orders refuses to sign AMA form IV removed catheter intact and dc paperwork given to patient who dresses and walks out to lobby

## 2022-08-14 NOTE — ED Notes (Signed)
Pt unhooked her monitoring equipment and took herself to the rr and back to room once again asking for pain meds

## 2022-08-14 NOTE — ED Notes (Signed)
Pt ambulatory to rr and back 

## 2022-08-14 NOTE — ED Notes (Signed)
Pt presents to room via wc c/o 4 day hx of cough sore throat and ear pain has taken nothing otc for relief. Is asking for pain meds.. Throat is red and swollen pt states it hurts to swallow. Pt a/o x 4 respirations even and non labored vs wnl

## 2022-09-01 DIAGNOSIS — M25562 Pain in left knee: Secondary | ICD-10-CM | POA: Insufficient documentation

## 2022-09-01 DIAGNOSIS — R519 Headache, unspecified: Secondary | ICD-10-CM | POA: Insufficient documentation

## 2022-09-01 DIAGNOSIS — I5021 Acute systolic (congestive) heart failure: Secondary | ICD-10-CM | POA: Insufficient documentation

## 2022-09-01 DIAGNOSIS — W19XXXA Unspecified fall, initial encounter: Secondary | ICD-10-CM | POA: Insufficient documentation

## 2022-09-01 DIAGNOSIS — Z79899 Other long term (current) drug therapy: Secondary | ICD-10-CM | POA: Insufficient documentation

## 2022-09-01 DIAGNOSIS — S8992XA Unspecified injury of left lower leg, initial encounter: Secondary | ICD-10-CM | POA: Insufficient documentation

## 2022-09-01 DIAGNOSIS — I110 Hypertensive heart disease with heart failure: Secondary | ICD-10-CM | POA: Insufficient documentation

## 2022-09-01 DIAGNOSIS — Z91040 Latex allergy status: Secondary | ICD-10-CM | POA: Insufficient documentation

## 2022-09-01 DIAGNOSIS — Y990 Civilian activity done for income or pay: Secondary | ICD-10-CM | POA: Insufficient documentation

## 2022-09-02 ENCOUNTER — Other Ambulatory Visit: Payer: Self-pay

## 2022-09-02 ENCOUNTER — Emergency Department (HOSPITAL_COMMUNITY)
Admission: EM | Admit: 2022-09-02 | Discharge: 2022-09-02 | Disposition: A | Discharge status: Home / Self Care | Payer: Medicaid - Out of State | Attending: Emergency Medicine | Admitting: Emergency Medicine

## 2022-09-02 ENCOUNTER — Emergency Department (HOSPITAL_COMMUNITY): Payer: Medicaid - Out of State

## 2022-09-02 DIAGNOSIS — S8992XA Unspecified injury of left lower leg, initial encounter: Secondary | ICD-10-CM

## 2022-09-02 DIAGNOSIS — W19XXXA Unspecified fall, initial encounter: Secondary | ICD-10-CM

## 2022-09-02 MED ORDER — KETOROLAC TROMETHAMINE 60 MG/2ML IM SOLN
30.0000 mg | Freq: Once | INTRAMUSCULAR | Status: DC
  Filled 2022-09-02: qty 2

## 2022-09-02 MED ORDER — IBUPROFEN 200 MG PO TABS
600.0000 mg | ORAL_TABLET | Freq: Once | ORAL | Status: AC
  Administered 2022-09-02: 600 mg via ORAL
  Filled 2022-09-02: qty 3

## 2022-09-02 NOTE — Discharge Instructions (Addendum)
For pain control you may take 1000 mg of Tylenol every 8 hours or 600 mg of Motrin every 6 hours as needed.

## 2022-09-02 NOTE — ED Triage Notes (Signed)
Pt arrives c/o after L knee pain mechanical trip and fall that occurred at work tonight. Pt states that she fell onto knee. Has been able to ambulate since, but states weight causes increased pain. Has not taken anything for pain.

## 2022-09-02 NOTE — ED Provider Notes (Signed)
[place] [facility] DE[facility]MENT AT [facility] Provider Note  CSN: [id number] Arrival date & time: 09/01/22 2358  Chief Complaint(s) Fall  HPI Mary Frey is a 47 y.o. female who presented after a mechanical fall while at work.  She reports tripping on a piece of equipment on the floor and landing halfway onto a conveyor belt.  She is complaining mostly of left knee pain and left face pain.  No other physical complaints.  She reports pain with weightbearing.  No swelling.  No lacerations.  No other physical complaints.   Fall    Past Medical History Past Medical History:  Diagnosis Date   Anemia    Chronic back pain    Fibroids    Graves disease    Hypertension    Pregnancy induced hypertension    Patient Active Problem List   Diagnosis Date Noted   Transaminitis 05/13/2021   Dehydration 05/13/2021   Abnormal EKG 05/13/2021   Leukocytosis 05/13/2021   Hyperthyroidism 05/13/2021   Diarrhea 05/12/2021   Elevated LFTs 05/11/2021   Jaundice    Abnormal liver function 10/30/2019   Liver failure (HCC) 10/29/2019   Abnormal LFTs 10/12/2019   History of use of hepatotoxic drug 10/12/2019   Abnormal liver enzymes 10/12/2019   Other pancytopenia (HCC) 06/22/2019   Cobalamin deficiency 06/22/2019   Cocaine abuse (HCC) 01/12/2013   Protein-calorie malnutrition, severe (HCC) 01/12/2013   Graves disease 01/12/2013   Acute systolic CHF (congestive heart failure) (HCC) 01/12/2013   HTN (hypertension) 01/12/2013   Microcytic anemia 01/12/2013   Tobacco use disorder 01/12/2013   Home Medication(s) Prior to Admission medications   Medication Sig Start Date End Date Taking? Authorizing Provider  atenolol (TENORMIN) 50 MG tablet Take 1 tablet (50 mg total) by mouth 2 (two) times daily. 05/13/21   Rodolph Bong, MD  cholestyramine light (PREVALITE) 4 g packet Take 1 packet (4 g total) by mouth 2 (two) times daily. Mix with 4-6 oz liquid.  Take other meds 1 hr  before or 4-6 hr after cholestyramine. 05/13/21 06/12/21  Rodolph Bong, MD  clindamycin (CLEOCIN) 300 MG capsule Take 1 capsule (300 mg total) by mouth 4 (four) times daily. X 7 days 08/14/22   Mesner, Barbara Cower, MD  diphenoxylate-atropine (LOMOTIL) 2.5-0.025 MG tablet Take 1 tablet by mouth 4 (four) times daily as needed for up to 20 doses for diarrhea or loose stools. 11/03/19   Lanae Boast, MD  fenofibrate 54 MG tablet Take 54 mg by mouth daily. 04/26/21   [provider]  FEROSUL 325 (65 Fe) MG tablet Take 325 mg by mouth daily. 10/25/19   [provider]  fluticasone (FLONASE) 50 MCG/ACT nasal spray Place 2 sprays into both nostrils daily. 05/13/21   Rodolph Bong, MD  hydrochlorothiazide (HYDRODIURIL) 25 MG tablet Take 25 mg by mouth 3 (three) times daily. 04/12/21   [provider]  hydrOXYzine (ATARAX) 25 MG tablet Take 1 tablet (25 mg total) by mouth every 8 (eight) hours as needed for itching or anxiety. 05/13/21   Rodolph Bong, MD  loratadine (CLARITIN) 10 MG tablet Take 1 tablet (10 mg total) by mouth daily. 05/14/21   Rodolph Bong, MD  melatonin 5 MG TABS Take 1 tablet (5 mg total) by mouth at bedtime. 05/13/21   Rodolph Bong, MD  Multiple Vitamins-Minerals (MULTIVITAMIN ADULTS PO) Take 1 tablet by mouth daily.    [provider]  naproxen (NAPROSYN) 500 MG tablet Take 500 mg  by mouth 2 (two) times daily as needed. 05/09/21   [provider]  oxyCODONE-acetaminophen (PERCOCET) 5-325 MG tablet Take 2 tablets by mouth every 4 (four) hours as needed. 08/14/22   Mesner, Barbara Cower, MD  triamcinolone cream (KENALOG) 0.1 % Apply 1 application topically 2 (two) times daily. 04/26/21   [provider]  valsartan (DIOVAN) 80 MG tablet Take 80 mg by mouth daily. 04/12/21   [provider]  norethindrone (MICRONOR,CAMILA,ERRIN) 0.35 MG tablet Take 1 tablet (0.35 mg total) by mouth daily. Patient not taking: Reported on 06/27/2015  03/30/14 03/30/19  Antionette Char, MD  propranolol (INDERAL) 20 MG tablet Take 1 tablet (20 mg total) by mouth 2 (two) times daily. Patient not taking: Reported on 06/27/2015 08/12/13 03/30/19  Carlus Pavlov, MD                                                                                                                                    Allergies Latex, Methimazole, Ptu [propylthiouracil], Hydrocodone, and Hydrocodone-acetaminophen  Review of Systems Review of Systems As noted in HPI  Physical Exam Vital Signs  I have reviewed the triage vital signs BP (!) 157/111 (BP Location: Left Arm)   Pulse (!) 110   Temp 98.3 F (36.8 C) (Oral)   Resp 16   Ht 5\' 5"  (1.651 m)   Wt 53.5 kg   LMP 07/10/2022 (Approximate)   SpO2 100%   BMI 19.64 kg/m   Physical Exam Vitals reviewed.  Constitutional:      General: She is not in acute distress.    Appearance: She is well-developed. She is not diaphoretic.  HENT:     Head: Normocephalic and atraumatic. No abrasion, contusion or laceration.     Jaw: No tenderness.     Right Ear: External ear normal.     Left Ear: External ear normal.     Nose: Nose normal.  Eyes:     General: No scleral icterus.    Conjunctiva/sclera: Conjunctivae normal.  Neck:     Trachea: Phonation normal.  Cardiovascular:     Rate and Rhythm: Normal rate and regular rhythm.  Pulmonary:     Effort: Pulmonary effort is normal. No respiratory distress.     Breath sounds: No stridor.  Abdominal:     General: There is no distension.  Musculoskeletal:        General: Normal range of motion.     Cervical back: Normal range of motion.     Right knee: Normal pulse.     Left knee: No swelling, deformity, effusion, ecchymosis or lacerations. Normal range of motion. Tenderness present. Normal pulse.       Legs:  Neurological:     Mental Status: She is alert and oriented to person, place, and time.  Psychiatric:        Behavior: Behavior normal.     ED  Results and Treatments Labs (all labs ordered are  listed, but only abnormal results are displayed) Labs Reviewed - No data to display                                                                                                                       EKG  EKG Interpretation  Date/Time:    Ventricular Rate:    PR Interval:    QRS Duration:   QT Interval:    QTC Calculation:   R Axis:     Text Interpretation:         Radiology DG Knee Complete 4 Views Left  Result Date: 09/02/2022 CLINICAL DATA:  [id number] Fall [id number] Pt arrives c/o after L knee pain mechanical trip and fall that occurred at work tonight. Pt states that she fell onto knee. Has been able to ambulate since, but states weight causes increased pain. EXAM: LEFT KNEE - COMPLETE 4+ VIEW COMPARISON:  X-ray left knee 01/11/2021 FINDINGS: No evidence of fracture, dislocation, or joint effusion. No evidence of arthropathy or other focal bone abnormality. Mild similar decreased bone density of the tibia. 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 Soft tissues are unremarkable. IMPRESSION: No acute displaced fracture or dislocation. Electronically Signed   By: Tish Frederickson M.D.   On: 09/02/2022 02:34    Medications Ordered in ED Medications  ibuprofen (ADVIL) tablet 600 mg (has no administration in time range)                                                                                                                                     Procedures Procedures  (including critical care time)  Medical Decision Making / ED Course  Click here for ABCD2, HEART and other calculators  Medical Decision Making Amount and/or Complexity of Data Reviewed Radiology: ordered and independent interpretation performed. Decision-making details documented in ED Course.    Mechanical fall with left knee pain. Possible contusion vs sprain. Plain film negative for acute fracture/dislocation.      Final Clinical Impression(s) / ED  Diagnoses Final diagnoses:  Fall, initial encounter  Injury of left knee, initial encounter   The patient appears reasonably screened and/or stabilized for discharge and I doubt any other medical condition or other [facility] requiring further screening, evaluation, or treatment in the ED at this time. I have discussed the findings, Dx and Tx plan with the patient/family who expressed understanding and agree(s) with the plan. Discharge instructions  discussed at length. The patient/family was given strict return precautions who verbalized understanding of the instructions. No further questions at time of discharge.  Disposition: Discharge  Condition: Good  ED Discharge Orders     None         Follow Up: Program, [facility] [address] [place] [address] [phone]  Call  to schedule an appointment for close follow up           This chart was dictated using voice recognition software.  Despite best efforts to proofread,  errors can occur which can change the documentation meaning.    Nira Conn, MD 09/02/22 [phone]

## 2022-09-03 ENCOUNTER — Encounter: Payer: Self-pay | Admitting: Oncology

## 2022-09-18 ENCOUNTER — Emergency Department (HOSPITAL_COMMUNITY)
Admission: EM | Admit: 2022-09-18 | Discharge: 2022-09-18 | Disposition: A | Discharge status: Home / Self Care | Payer: Medicaid - Out of State | Attending: Emergency Medicine | Admitting: Emergency Medicine

## 2022-09-18 ENCOUNTER — Other Ambulatory Visit: Payer: Self-pay

## 2022-09-18 DIAGNOSIS — R110 Nausea: Secondary | ICD-10-CM | POA: Diagnosis not present

## 2022-09-18 DIAGNOSIS — Z79899 Other long term (current) drug therapy: Secondary | ICD-10-CM | POA: Diagnosis not present

## 2022-09-18 DIAGNOSIS — M25561 Pain in right knee: Secondary | ICD-10-CM

## 2022-09-18 DIAGNOSIS — Z91040 Latex allergy status: Secondary | ICD-10-CM | POA: Insufficient documentation

## 2022-09-18 DIAGNOSIS — R7401 Elevation of levels of liver transaminase levels: Secondary | ICD-10-CM | POA: Diagnosis not present

## 2022-09-18 DIAGNOSIS — R748 Abnormal levels of other serum enzymes: Secondary | ICD-10-CM

## 2022-09-18 DIAGNOSIS — I10 Essential (primary) hypertension: Secondary | ICD-10-CM | POA: Diagnosis not present

## 2022-09-18 DIAGNOSIS — E0590 Thyrotoxicosis, unspecified without thyrotoxic crisis or storm: Secondary | ICD-10-CM | POA: Diagnosis not present

## 2022-09-18 DIAGNOSIS — R197 Diarrhea, unspecified: Secondary | ICD-10-CM | POA: Insufficient documentation

## 2022-09-18 DIAGNOSIS — D649 Anemia, unspecified: Secondary | ICD-10-CM | POA: Insufficient documentation

## 2022-09-18 DIAGNOSIS — R2230 Localized swelling, mass and lump, unspecified upper limb: Secondary | ICD-10-CM | POA: Diagnosis present

## 2022-09-18 LAB — T4, FREE: Free T4: 2.29 ng/dL — ABNORMAL HIGH (ref 0.61–1.12)

## 2022-09-18 LAB — CBC WITH DIFFERENTIAL/PLATELET
Abs Immature Granulocytes: 0.01 10*3/uL (ref 0.00–0.07)
Basophils Absolute: 0 10*3/uL (ref 0.0–0.1)
Basophils Relative: 0 %
Eosinophils Absolute: 0.1 10*3/uL (ref 0.0–0.5)
Eosinophils Relative: 1 %
HCT: 29.9 % — ABNORMAL LOW (ref 36.0–46.0)
Hemoglobin: 9 g/dL — ABNORMAL LOW (ref 12.0–15.0)
Immature Granulocytes: 0 %
Lymphocytes Relative: 37 %
Lymphs Abs: 2.3 10*3/uL (ref 0.7–4.0)
MCH: 17.6 pg — ABNORMAL LOW (ref 26.0–34.0)
MCHC: 30.1 g/dL (ref 30.0–36.0)
MCV: 58.6 fL — ABNORMAL LOW (ref 80.0–100.0)
Monocytes Absolute: 0.7 10*3/uL (ref 0.1–1.0)
Monocytes Relative: 11 %
Neutro Abs: 3.2 10*3/uL (ref 1.7–7.7)
Neutrophils Relative %: 51 %
Platelets: 292 10*3/uL (ref 150–400)
RBC: 5.1 MIL/uL (ref 3.87–5.11)
RDW: 23.2 % — ABNORMAL HIGH (ref 11.5–15.5)
Smear Review: ADEQUATE
WBC: 6.3 10*3/uL (ref 4.0–10.5)
nRBC: 0 % (ref 0.0–0.2)

## 2022-09-18 LAB — COMPREHENSIVE METABOLIC PANEL
ALT: 340 U/L — ABNORMAL HIGH (ref 0–44)
AST: 742 U/L — ABNORMAL HIGH (ref 15–41)
Albumin: 3.1 g/dL — ABNORMAL LOW (ref 3.5–5.0)
Alkaline Phosphatase: 246 U/L — ABNORMAL HIGH (ref 38–126)
Anion gap: 10 (ref 5–15)
BUN: 7 mg/dL (ref 6–20)
CO2: 18 mmol/L — ABNORMAL LOW (ref 22–32)
Calcium: 9.2 mg/dL (ref 8.9–10.3)
Chloride: 106 mmol/L (ref 98–111)
Creatinine, Ser: 0.48 mg/dL (ref 0.44–1.00)
GFR, Estimated: >60 mL/min (ref >60)
Glucose, Bld: 94 mg/dL (ref 70–99)
Potassium: 3.9 mmol/L (ref 3.5–5.1)
Sodium: 134 mmol/L — ABNORMAL LOW (ref 135–145)
Total Bilirubin: 2 mg/dL — ABNORMAL HIGH (ref 0.3–1.2)
Total Protein: 7.7 g/dL (ref 6.5–8.1)

## 2022-09-18 LAB — TSH: TSH: <0.01 u[IU]/mL — ABNORMAL LOW (ref 0.350–4.500)

## 2022-09-18 LAB — I-STAT BETA HCG BLOOD, ED (MC, WL, AP ONLY): I-stat hCG, quantitative: 10.2 m[IU]/mL — ABNORMAL HIGH (ref <5)

## 2022-09-18 MED ORDER — DICLOFENAC SODIUM 1 % EX GEL: 2.0000 g | Freq: Four times a day (QID) | CUTANEOUS | 1 refills | Status: DC

## 2022-09-18 MED ORDER — ATENOLOL 50 MG PO TABS: 50.0000 mg | ORAL_TABLET | Freq: Two times a day (BID) | ORAL | 1 refills | Status: DC

## 2022-09-18 MED ORDER — IBUPROFEN 400 MG PO TABS
600.0000 mg | ORAL_TABLET | Freq: Once | ORAL | Status: DC
  Filled 2022-09-18: qty 1

## 2022-09-18 MED ORDER — ONDANSETRON 4 MG PO TBDP
4.0000 mg | ORAL_TABLET | Freq: Once | ORAL | Status: AC
  Administered 2022-09-18: 4 mg via ORAL
  Filled 2022-09-18: qty 1

## 2022-09-18 NOTE — ED Provider Notes (Signed)
[place] EMERGENCY DEPARTMENT AT [facility] Provider Note   CSN: [id number] Arrival date & time: 09/18/22  1191     History  Chief Complaint  Patient presents with   Throat Swelling    Mary Frey is a 47 y.o. female with a history of hyperthyroidism, hypertension, uterine fibroids, and chronic back pain who presents to the ED today for thyroid enlargement, abdominal pain, and left knee pain for the past 3 weeks. Patient states that she thinks her "thyroid is acting up" and enlarged. She denies any associated shortness of breath, difficulty swallowing, sore throat, or recent illness. Patient reports that she is also on her period and she is have transient lower abdominal pain while menstruating. She endorses nausea and diarrhea but denies vomiting, dysuria, hematuria, or constipation. Lastly, patient reports pain to her left knee with movement. She reports that she fell on a conveyer belt at work a couple of weeks ago and has had pain since. She was seen in the ED after the incident and there was no acute findings on imaging. Patient is wearing a knee brace and reports that she is going to PT for it. The pain is better than it was initially. No additional complaints or concerns at this time.     Home Medications Prior to Admission medications   Medication Sig Start Date End Date Taking? Authorizing Provider  atenolol (TENORMIN) 50 MG tablet Take 1 tablet (50 mg total) by mouth 2 (two) times daily. 05/13/21   Rodolph Bong, MD  cholestyramine light (PREVALITE) 4 g packet Take 1 packet (4 g total) by mouth 2 (two) times daily. Mix with 4-6 oz liquid.  Take other meds 1 hr before or 4-6 hr after cholestyramine. 05/13/21 06/12/21  Rodolph Bong, MD  clindamycin (CLEOCIN) 300 MG capsule Take 1 capsule (300 mg total) by mouth 4 (four) times daily. X 7 days 08/14/22   Mesner, Barbara Cower, MD  diphenoxylate-atropine (LOMOTIL) 2.5-0.025 MG tablet Take 1 tablet by mouth 4 (four)  times daily as needed for up to 20 doses for diarrhea or loose stools. 11/03/19   Lanae Boast, MD  fenofibrate 54 MG tablet Take 54 mg by mouth daily. 04/26/21   [provider]  FEROSUL 325 (65 Fe) MG tablet Take 325 mg by mouth daily. 10/25/19   [provider]  fluticasone (FLONASE) 50 MCG/ACT nasal spray Place 2 sprays into both nostrils daily. 05/13/21   Rodolph Bong, MD  hydrochlorothiazide (HYDRODIURIL) 25 MG tablet Take 25 mg by mouth 3 (three) times daily. 04/12/21   [provider]  hydrOXYzine (ATARAX) 25 MG tablet Take 1 tablet (25 mg total) by mouth every 8 (eight) hours as needed for itching or anxiety. 05/13/21   Rodolph Bong, MD  loratadine (CLARITIN) 10 MG tablet Take 1 tablet (10 mg total) by mouth daily. 05/14/21   Rodolph Bong, MD  melatonin 5 MG TABS Take 1 tablet (5 mg total) by mouth at bedtime. 05/13/21   Rodolph Bong, MD  Multiple Vitamins-Minerals (MULTIVITAMIN ADULTS PO) Take 1 tablet by mouth daily.    [provider]  naproxen (NAPROSYN) 500 MG tablet Take 500 mg by mouth 2 (two) times daily as needed. 05/09/21   [provider]  oxyCODONE-acetaminophen (PERCOCET) 5-325 MG tablet Take 2 tablets by mouth every 4 (four) hours as needed. 08/14/22   Mesner, Barbara Cower, MD  triamcinolone cream (KENALOG) 0.1 % Apply 1 application topically 2 (two) times daily. 04/26/21  [provider]  valsartan (DIOVAN) 80 MG tablet Take 80 mg by mouth daily. 04/12/21   [provider]  norethindrone (MICRONOR,CAMILA,ERRIN) 0.35 MG tablet Take 1 tablet (0.35 mg total) by mouth daily. Patient not taking: Reported on 06/27/2015 03/30/14 03/30/19  Antionette Char, MD  propranolol (INDERAL) 20 MG tablet Take 1 tablet (20 mg total) by mouth 2 (two) times daily. Patient not taking: Reported on 06/27/2015 08/12/13 03/30/19  Carlus Pavlov, MD      Allergies    Latex, Methimazole, Ptu [propylthiouracil], Hydrocodone, and  Hydrocodone-acetaminophen    Review of Systems   Review of Systems  HENT:         "Swollen thyroid"  Gastrointestinal:  Positive for abdominal pain.  Musculoskeletal:        Right knee pain  All other systems reviewed and are negative.   Physical Exam Updated Vital Signs BP (!) 155/106 (BP Location: Right Arm)   Pulse 82   Temp 98.1 F (36.7 C) (Oral)   Resp 16   LMP 07/10/2022 (Approximate)   SpO2 98%  Physical Exam Vitals and nursing note reviewed.  Constitutional:      Appearance: Normal appearance.  HENT:     Head: Normocephalic and atraumatic.     Mouth/Throat:     Mouth: Mucous membranes are moist.  Eyes:     Conjunctiva/sclera: Conjunctivae normal.     Pupils: Pupils are equal, round, and reactive to light.  Neck:     Comments: Tenderness to thyroid Cardiovascular:     Rate and Rhythm: Normal rate and regular rhythm.     Pulses: Normal pulses.     Heart sounds: Normal heart sounds.  Pulmonary:     Effort: Pulmonary effort is normal.     Breath sounds: Normal breath sounds.  Abdominal:     Palpations: Abdomen is soft.     Tenderness: There is abdominal tenderness.     Comments: Tenderness to suprapubic region  Musculoskeletal:        General: Tenderness present. Normal range of motion.     Cervical back: Normal range of motion.     Comments: Left knee  Skin:    General: Skin is warm and dry.     Findings: No rash.  Neurological:     General: No focal deficit present.     Mental Status: She is alert.  Psychiatric:        Mood and Affect: Mood normal.        Behavior: Behavior normal.     ED Results / Procedures / Treatments   Labs (all labs ordered are listed, but only abnormal results are displayed) Labs Reviewed  COMPREHENSIVE METABOLIC PANEL  CBC WITH DIFFERENTIAL/PLATELET  TSH  T4, FREE  T3  URINALYSIS, ROUTINE W REFLEX MICROSCOPIC  PREGNANCY, URINE  I-STAT BETA HCG BLOOD, ED ([facility], [facility], [facility] ONLY)    EKG None  Radiology No results  found.  Procedures Procedures: not indicated.   Medications Ordered in ED Medications  ibuprofen (ADVIL) tablet 600 mg (600 mg Oral Not Given 09/18/22 0639)  ondansetron (ZOFRAN-ODT) disintegrating tablet 4 mg (4 mg Oral Given 09/18/22 0865)    ED Course/ Medical Decision Making/ A&P                             Medical Decision Making Amount and/or Complexity of Data Reviewed Labs: ordered.  Risk Prescription drug management.   This patient presents to the ED  for concern of enlarged thyroid, abdominal pain, and knee pain this involves an extensive number of treatment options, and is a complaint that carries with it a high risk of complications and morbidity.  The differential diagnosis includes: uncontrolled hyperthyroidism, goiter, thyroid storm.   Co morbidities that complicate the patient evaluation  Hyperthyroidism Anemia Chronic back pain Hypertension   Labs pending at shift change. Patient care taken over by Tonny Bollman.         Final Clinical Impression(s) / ED Diagnoses Final diagnoses:  None    Rx / DC Orders ED Discharge Orders     None         Maxwell Marion, PA-C 09/18/22 1610    Gilda Crease, MD 09/19/22 [phone]

## 2022-09-18 NOTE — Discharge Instructions (Addendum)
You had a positive pregnancy test today.  We were unable to confirm if this was accurate with a urine.  You may do this at home with a home pregnancy test.  It is likely false positive. It is crucially important that you follow-up with your endocrinologist for management and treatment of your hyperthyroidism.  Please continue taking your atenolol to protect your heart as you currently have untreated hyperthyroidism.  This will help prevent you from getting heart failure however it does not treat your thyroid.  I have represcribed your atenolol's that you do not run out of medication. Please make sure to follow-up with your GI specialist regarding your elevated liver enzymes.  You need to follow-up with your primary care physician for any other needs.   Contact a health care provider if: Your symptoms do not get better with treatment. You have a fever. You have abdominal pain. You feel dizzy. You are taking thyroid hormone replacement medicine and: You have symptoms of depression. You feel like you are tired all the time. You gain weight. Get help right away if: You have sudden, unexplained confusion or other mental changes. You have chest pain. You have fast or irregular heartbeats (palpitations). You have difficulty breathing. These symptoms may be an emergency. Get help right away. Call 911. Do not wait to see if the symptoms will go away. Do not drive yourself to the hospital.

## 2022-09-18 NOTE — ED Provider Notes (Signed)
Patient taken in sing out from [facility] [place] [address]. Patient here with c/o "thyroid acting up" Hasn't taken her medication. Also having R knee pain and requesting narcotics.   Patient with multiple complaints.  I was able to review the patient's labs.  Patient still appears to be hyperthyroid.  I had a long discussion about all of her lab results.  She was unable to provide a urine to disapprove likely false positive i-STAT hCG so she may take a home pregnancy test. I discussed with the patient the fact that atenolol does not treat her thyroid and due to her history which I have extensively reviewed she is on likely a good candidate for taking either propylthiouracil or methimazole as there is some suspicion that her transaminitis is secondary to suspected toxicity from the medication. That the atenolol that she is taking will only help protect her heart from heart failure however it does not treat her hyperthyroidism and that she will need to follow-up with her endocrinologist who tried to get in touch with her about a year ago per chart review as her only 2 options remaining are thyroidectomy Patient also continues to have knee pain.  She states that Motrin is not working.  I will have her use Voltaren cream.  She has a PCP appointment tomorrow.  Discussed outpatient follow-up and return precautions.   Arthor Captain, PA-C 09/18/22 1541    Pricilla Loveless, MD 09/20/22 [phone]

## 2022-09-18 NOTE — ED Triage Notes (Signed)
Patient reports mild swelling at throat for 2 weeks , denies pain , respirations unlabored .

## 2022-09-19 LAB — T3: T3, Total: 351 ng/dL — ABNORMAL HIGH (ref 71–180)

## 2023-03-31 ENCOUNTER — Other Ambulatory Visit: Payer: Medicaid - Out of State

## 2023-04-28 ENCOUNTER — Emergency Department (HOSPITAL_COMMUNITY)
Admission: EM | Admit: 2023-04-28 | Discharge: 2023-04-28 | Disposition: A | Discharge status: Home / Self Care | Payer: Medicaid - Out of State | Attending: Emergency Medicine | Admitting: Emergency Medicine

## 2023-04-28 DIAGNOSIS — Z91040 Latex allergy status: Secondary | ICD-10-CM | POA: Diagnosis not present

## 2023-04-28 DIAGNOSIS — F410 Panic disorder [episodic paroxysmal anxiety] without agoraphobia: Secondary | ICD-10-CM | POA: Diagnosis present

## 2023-04-28 MED ORDER — ATENOLOL 50 MG PO TABS
50.0000 mg | ORAL_TABLET | Freq: Once | ORAL | Status: AC
  Administered 2023-04-28: 50 mg via ORAL
  Filled 2023-04-28: qty 1

## 2023-04-28 MED ORDER — HYDROXYZINE HCL 25 MG PO TABS
25.0000 mg | ORAL_TABLET | Freq: Once | ORAL | Status: AC
  Administered 2023-04-28: 25 mg via ORAL
  Filled 2023-04-28: qty 1

## 2023-04-28 NOTE — ED Provider Notes (Signed)
 [place] EMERGENCY DEPARTMENT AT [facility] Provider Note   CSN: [id number] Arrival date & time: 04/28/23  1855     History  Chief Complaint  Patient presents with   Panic Attack    Mary Frey is a 48 y.o. female.  Patient to ED reporting symptoms of panic attack. She normally takes hydroxyzine  and has been out. No fever. No vomiting.   The history is provided by the patient. No language interpreter was used.       Home Medications Prior to Admission medications   Medication Sig Start Date End Date Taking? Authorizing Provider  atenolol  (TENORMIN ) 50 MG tablet Take 1 tablet (50 mg total) by mouth 2 (two) times daily. 09/18/22   Harris, Abigail, PA-C  cholestyramine  light (PREVALITE ) 4 g packet Take 1 packet (4 g total) by mouth 2 (two) times daily. Mix with 4-6 oz liquid.  Take other meds 1 hr before or 4-6 hr after cholestyramine . 05/13/21 06/12/21  Sebastian Toribio GAILS, MD  clindamycin  (CLEOCIN ) 300 MG capsule Take 1 capsule (300 mg total) by mouth 4 (four) times daily. X 7 days 08/14/22   Mesner, Selinda, MD  diclofenac  Sodium (VOLTAREN ) 1 % GEL Apply 2 g topically 4 (four) times daily. 09/18/22   Harris, Abigail, PA-C  diphenoxylate -atropine  (LOMOTIL ) 2.5-0.025 MG tablet Take 1 tablet by mouth 4 (four) times daily as needed for up to 20 doses for diarrhea or loose stools. 11/03/19   Christobal Guadalajara, MD  fenofibrate 54 MG tablet Take 54 mg by mouth daily. 04/26/21   [provider]  FEROSUL 325 (65 Fe) MG tablet Take 325 mg by mouth daily. 10/25/19   [provider]  fluticasone  (FLONASE ) 50 MCG/ACT nasal spray Place 2 sprays into both nostrils daily. 05/13/21   Sebastian Toribio GAILS, MD  hydrochlorothiazide (HYDRODIURIL) 25 MG tablet Take 25 mg by mouth 3 (three) times daily. 04/12/21   [provider]  hydrOXYzine  (ATARAX ) 25 MG tablet Take 1 tablet (25 mg total) by mouth every 8 (eight) hours as needed for itching or anxiety. 05/13/21   Sebastian Toribio GAILS, MD  loratadine  (CLARITIN ) 10 MG tablet Take 1 tablet (10 mg total) by mouth daily. 05/14/21   Sebastian Toribio GAILS, MD  melatonin 5 MG TABS Take 1 tablet (5 mg total) by mouth at bedtime. 05/13/21   Sebastian Toribio GAILS, MD  Multiple Vitamins-Minerals (MULTIVITAMIN ADULTS PO) Take 1 tablet by mouth daily.    [provider]  naproxen  (NAPROSYN ) 500 MG tablet Take 500 mg by mouth 2 (two) times daily as needed. 05/09/21   [provider]  oxyCODONE -acetaminophen  (PERCOCET) 5-325 MG tablet Take 2 tablets by mouth every 4 (four) hours as needed. 08/14/22   Mesner, Selinda, MD  triamcinolone  cream (KENALOG ) 0.1 % Apply 1 application topically 2 (two) times daily. 04/26/21   [provider]  valsartan (DIOVAN) 80 MG tablet Take 80 mg by mouth daily. 04/12/21   [provider]  norethindrone  (MICRONOR ,CAMILA ,ERRIN ) 0.35 MG tablet Take 1 tablet (0.35 mg total) by mouth daily. Patient not taking: Reported on 06/27/2015 03/30/14 03/30/19  Rogelio Planas, MD  propranolol  (INDERAL ) 20 MG tablet Take 1 tablet (20 mg total) by mouth 2 (two) times daily. Patient not taking: Reported on 06/27/2015 08/12/13 03/30/19  Trixie File, MD      Allergies    Latex, Methimazole , Ptu [propylthiouracil], Hydrocodone, and Hydrocodone-acetaminophen     Review of Systems   Review of Systems  Physical Exam Updated Vital Signs  BP (!) 183/99 (BP Location: Right Arm)   Pulse 78   Temp 97.9 F (36.6 C) (Oral)   Resp 20   SpO2 95%  Physical Exam Constitutional:      General: She is not in acute distress.    Appearance: She is well-developed. She is not ill-appearing.  Pulmonary:     Effort: Pulmonary effort is normal.  Musculoskeletal:        General: Normal range of motion.     Cervical back: Normal range of motion.  Skin:    General: Skin is warm and dry.  Neurological:     Mental Status: She is alert and oriented to person, place, and time.     ED Results / Procedures /  Treatments   Labs (all labs ordered are listed, but only abnormal results are displayed) Labs Reviewed  BASIC METABOLIC PANEL  CBC  HCG, SERUM, QUALITATIVE  TROPONIN I (HIGH SENSITIVITY)  TROPONIN I (HIGH SENSITIVITY)    EKG None  Radiology No results found.  Procedures Procedures    Medications Ordered in ED Medications  hydrOXYzine  (ATARAX ) tablet 25 mg (25 mg Oral Given 04/28/23 1959)  atenolol  (TENORMIN ) tablet 50 mg (50 mg Oral Given 04/28/23 1959)    ED Course/ Medical Decision Making/ A&P Clinical Course as of 04/28/23 2124  Mon Apr 28, 2023  2121 Patient to ED with symptoms typical of her panic attacks. No new symptoms. She has been out of her hydroxyzine  and is requesting a single dose. She is also requesting a dose of her ATenolol  that she has been out of as well. These are provided in the ED. She reports having been in contact with her doctor and prescriptions have been provided but she hasn't been able to pick them up.  [SU]    Clinical Course User Index [SU] Odell Balls, PA-C                                 Medical Decision Making Amount and/or Complexity of Data Reviewed Labs: ordered.  Risk Prescription drug management.           Final Clinical Impression(s) / ED Diagnoses Final diagnoses:  Panic attack    Rx / DC Orders ED Discharge Orders     None         Odell Balls RIGGERS 04/28/23 2124    Doretha Folks, MD 04/28/23 2352

## 2023-04-28 NOTE — ED Triage Notes (Signed)
 RN to room, pt on scrolling on phone RR even and unlabored.  Pt c/o panic attack x 1 hour , reports s/s is anxiety and SOB. Pt reports currently having these s/s.Pt with hx of anxiety out of anxiety meds.  RR even during triage, pt talking in full sentences, no accessory muscle usage.

## 2023-04-28 NOTE — ED Notes (Signed)
Pt refused EKG - RN notified °

## 2023-04-28 NOTE — Discharge Instructions (Signed)
 Follow up with your doctor as needed.   Go to the pharmacy and pick up your regular medications and take as instructed.

## 2023-10-15 ENCOUNTER — Other Ambulatory Visit: Payer: Self-pay

## 2023-10-15 ENCOUNTER — Encounter (HOSPITAL_COMMUNITY): Payer: Self-pay

## 2023-10-15 ENCOUNTER — Emergency Department (HOSPITAL_COMMUNITY)
Admission: EM | Admit: 2023-10-15 | Discharge: 2023-10-15 | Discharge status: Against Medical Advice | Attending: Emergency Medicine | Admitting: Emergency Medicine

## 2023-10-15 DIAGNOSIS — Z5321 Procedure and treatment not carried out due to patient leaving prior to being seen by health care provider: Secondary | ICD-10-CM | POA: Diagnosis not present

## 2023-10-15 DIAGNOSIS — Z760 Encounter for issue of repeat prescription: Secondary | ICD-10-CM | POA: Diagnosis present
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
## 2023-10-15 NOTE — ED Triage Notes (Signed)
 Refill on medication  and would like STI Screening had unprotected sex.  Denies any vaginal discharge but has vaginal discomfort .

## 2024-02-27 ENCOUNTER — Emergency Department (HOSPITAL_COMMUNITY)

## 2024-02-27 ENCOUNTER — Inpatient Hospital Stay (HOSPITAL_COMMUNITY)
Admission: EM | Admit: 2024-02-27 | Discharge: 2024-03-05 | DRG: 644 | Disposition: A | Discharge status: Home / Self Care | Attending: Internal Medicine | Admitting: Internal Medicine

## 2024-02-27 ENCOUNTER — Other Ambulatory Visit: Payer: Self-pay

## 2024-02-27 ENCOUNTER — Observation Stay (HOSPITAL_COMMUNITY)

## 2024-02-27 ENCOUNTER — Encounter (HOSPITAL_COMMUNITY): Payer: Self-pay | Admitting: *Deleted

## 2024-02-27 DIAGNOSIS — F17200 Nicotine dependence, unspecified, uncomplicated: Secondary | ICD-10-CM

## 2024-02-27 DIAGNOSIS — E785 Hyperlipidemia, unspecified: Secondary | ICD-10-CM | POA: Diagnosis present

## 2024-02-27 DIAGNOSIS — I5022 Chronic systolic (congestive) heart failure: Secondary | ICD-10-CM | POA: Diagnosis not present

## 2024-02-27 DIAGNOSIS — R931 Abnormal findings on diagnostic imaging of heart and coronary circulation: Secondary | ICD-10-CM

## 2024-02-27 DIAGNOSIS — E0590 Thyrotoxicosis, unspecified without thyrotoxic crisis or storm: Secondary | ICD-10-CM | POA: Diagnosis present

## 2024-02-27 DIAGNOSIS — Z9229 Personal history of other drug therapy: Secondary | ICD-10-CM

## 2024-02-27 DIAGNOSIS — R079 Chest pain, unspecified: Secondary | ICD-10-CM | POA: Diagnosis not present

## 2024-02-27 DIAGNOSIS — M546 Pain in thoracic spine: Secondary | ICD-10-CM | POA: Diagnosis present

## 2024-02-27 DIAGNOSIS — Z560 Unemployment, unspecified: Secondary | ICD-10-CM

## 2024-02-27 DIAGNOSIS — Z79899 Other long term (current) drug therapy: Secondary | ICD-10-CM

## 2024-02-27 DIAGNOSIS — R0609 Other forms of dyspnea: Secondary | ICD-10-CM | POA: Diagnosis not present

## 2024-02-27 DIAGNOSIS — D519 Vitamin B12 deficiency anemia, unspecified: Secondary | ICD-10-CM | POA: Diagnosis present

## 2024-02-27 DIAGNOSIS — F1410 Cocaine abuse, uncomplicated: Secondary | ICD-10-CM | POA: Diagnosis not present

## 2024-02-27 DIAGNOSIS — I340 Nonrheumatic mitral (valve) insufficiency: Secondary | ICD-10-CM | POA: Diagnosis not present

## 2024-02-27 DIAGNOSIS — I110 Hypertensive heart disease with heart failure: Secondary | ICD-10-CM | POA: Diagnosis present

## 2024-02-27 DIAGNOSIS — Z681 Body mass index (BMI) 19 or less, adult: Secondary | ICD-10-CM

## 2024-02-27 DIAGNOSIS — Z803 Family history of malignant neoplasm of breast: Secondary | ICD-10-CM

## 2024-02-27 DIAGNOSIS — I5020 Unspecified systolic (congestive) heart failure: Secondary | ICD-10-CM

## 2024-02-27 DIAGNOSIS — E440 Moderate protein-calorie malnutrition: Secondary | ICD-10-CM | POA: Diagnosis present

## 2024-02-27 DIAGNOSIS — R7989 Other specified abnormal findings of blood chemistry: Secondary | ICD-10-CM

## 2024-02-27 DIAGNOSIS — R634 Abnormal weight loss: Secondary | ICD-10-CM | POA: Diagnosis present

## 2024-02-27 DIAGNOSIS — Z885 Allergy status to narcotic agent status: Secondary | ICD-10-CM

## 2024-02-27 DIAGNOSIS — I5083 High output heart failure: Secondary | ICD-10-CM | POA: Diagnosis present

## 2024-02-27 DIAGNOSIS — I5042 Chronic combined systolic (congestive) and diastolic (congestive) heart failure: Secondary | ICD-10-CM | POA: Diagnosis present

## 2024-02-27 DIAGNOSIS — Z888 Allergy status to other drugs, medicaments and biological substances status: Secondary | ICD-10-CM

## 2024-02-27 DIAGNOSIS — E871 Hypo-osmolality and hyponatremia: Secondary | ICD-10-CM | POA: Diagnosis present

## 2024-02-27 DIAGNOSIS — Z91040 Latex allergy status: Secondary | ICD-10-CM

## 2024-02-27 DIAGNOSIS — R000 Tachycardia, unspecified: Secondary | ICD-10-CM | POA: Diagnosis present

## 2024-02-27 DIAGNOSIS — F17210 Nicotine dependence, cigarettes, uncomplicated: Secondary | ICD-10-CM | POA: Diagnosis present

## 2024-02-27 DIAGNOSIS — E0500 Thyrotoxicosis with diffuse goiter without thyrotoxic crisis or storm: Principal | ICD-10-CM | POA: Diagnosis present

## 2024-02-27 DIAGNOSIS — E213 Hyperparathyroidism, unspecified: Secondary | ICD-10-CM | POA: Diagnosis present

## 2024-02-27 DIAGNOSIS — F1290 Cannabis use, unspecified, uncomplicated: Secondary | ICD-10-CM

## 2024-02-27 DIAGNOSIS — Z823 Family history of stroke: Secondary | ICD-10-CM

## 2024-02-27 DIAGNOSIS — Z833 Family history of diabetes mellitus: Secondary | ICD-10-CM

## 2024-02-27 DIAGNOSIS — D509 Iron deficiency anemia, unspecified: Secondary | ICD-10-CM | POA: Diagnosis not present

## 2024-02-27 DIAGNOSIS — K761 Chronic passive congestion of liver: Secondary | ICD-10-CM | POA: Diagnosis present

## 2024-02-27 DIAGNOSIS — E042 Nontoxic multinodular goiter: Secondary | ICD-10-CM | POA: Diagnosis present

## 2024-02-27 DIAGNOSIS — F419 Anxiety disorder, unspecified: Secondary | ICD-10-CM | POA: Diagnosis present

## 2024-02-27 DIAGNOSIS — R0602 Shortness of breath: Principal | ICD-10-CM | POA: Diagnosis present

## 2024-02-27 DIAGNOSIS — E890 Postprocedural hypothyroidism: Secondary | ICD-10-CM | POA: Diagnosis present

## 2024-02-27 DIAGNOSIS — D72829 Elevated white blood cell count, unspecified: Secondary | ICD-10-CM | POA: Diagnosis present

## 2024-02-27 LAB — T4, FREE: Free T4: 2.39 ng/dL — ABNORMAL HIGH (ref 0.61–1.12)

## 2024-02-27 LAB — BRAIN NATRIURETIC PEPTIDE: B Natriuretic Peptide: 721.2 pg/mL — ABNORMAL HIGH (ref 0.0–100.0)

## 2024-02-27 LAB — TSH: TSH: <0.1 u[IU]/mL — ABNORMAL LOW (ref 0.350–4.500)

## 2024-02-27 LAB — FERRITIN: Ferritin: 9 ng/mL — ABNORMAL LOW (ref 11–307)

## 2024-02-27 LAB — ECHOCARDIOGRAM COMPLETE
Height: 65 in
S' Lateral: 3.7 cm
Weight: 1888 [oz_av]

## 2024-02-27 LAB — CBC
HCT: 29.8 % — ABNORMAL LOW (ref 36.0–46.0)
Hemoglobin: 9.2 g/dL — ABNORMAL LOW (ref 12.0–15.0)
MCH: 19 pg — ABNORMAL LOW (ref 26.0–34.0)
MCHC: 30.9 g/dL (ref 30.0–36.0)
MCV: 61.7 fL — ABNORMAL LOW (ref 80.0–100.0)
Platelets: 258 K/uL (ref 150–400)
RBC: 4.83 MIL/uL (ref 3.87–5.11)
RDW: 19 % — ABNORMAL HIGH (ref 11.5–15.5)
WBC: 6.5 K/uL (ref 4.0–10.5)
nRBC: 0 % (ref 0.0–0.2)

## 2024-02-27 LAB — RAPID URINE DRUG SCREEN, HOSP PERFORMED
Amphetamines: NOT DETECTED
Barbiturates: NOT DETECTED
Benzodiazepines: NOT DETECTED
Cocaine: POSITIVE — AB
Opiates: NOT DETECTED
Tetrahydrocannabinol: NOT DETECTED

## 2024-02-27 LAB — BASIC METABOLIC PANEL WITH GFR
Anion gap: 13 (ref 5–15)
BUN: 9 mg/dL (ref 6–20)
CO2: 18 mmol/L — ABNORMAL LOW (ref 22–32)
Calcium: 8.9 mg/dL (ref 8.9–10.3)
Chloride: 103 mmol/L (ref 98–111)
Creatinine, Ser: 0.46 mg/dL (ref 0.44–1.00)
GFR, Estimated: >60 mL/min (ref >60)
Glucose, Bld: 96 mg/dL (ref 70–99)
Potassium: 4.2 mmol/L (ref 3.5–5.1)
Sodium: 134 mmol/L — ABNORMAL LOW (ref 135–145)

## 2024-02-27 LAB — IRON AND TIBC
Iron: 18 ug/dL — ABNORMAL LOW (ref 28–170)
Saturation Ratios: 4 % — ABNORMAL LOW (ref 10.4–31.8)
TIBC: 442 ug/dL (ref 250–450)
UIBC: 424 ug/dL

## 2024-02-27 LAB — HCG, SERUM, QUALITATIVE: Preg, Serum: NEGATIVE

## 2024-02-27 LAB — TROPONIN I (HIGH SENSITIVITY)
Troponin I (High Sensitivity): 88 ng/L — ABNORMAL HIGH (ref <18)
Troponin I (High Sensitivity): 94 ng/L — ABNORMAL HIGH (ref <18)

## 2024-02-27 LAB — RETICULOCYTES
Immature Retic Fract: 12.9 % (ref 2.3–15.9)
RBC.: 4.71 MIL/uL (ref 3.87–5.11)
Retic Count, Absolute: 45.7 K/uL (ref 19.0–186.0)
Retic Ct Pct: 1 % (ref 0.4–3.1)

## 2024-02-27 LAB — HEPATIC FUNCTION PANEL
ALT: 23 U/L (ref 0–44)
AST: 32 U/L (ref 15–41)
Albumin: 3.4 g/dL — ABNORMAL LOW (ref 3.5–5.0)
Alkaline Phosphatase: 118 U/L (ref 38–126)
Bilirubin, Direct: <0.1 mg/dL (ref 0.0–0.2)
Total Bilirubin: 0.3 mg/dL (ref 0.0–1.2)
Total Protein: 7.6 g/dL (ref 6.5–8.1)

## 2024-02-27 LAB — D-DIMER, QUANTITATIVE: D-Dimer, Quant: 1.94 ug{FEU}/mL — ABNORMAL HIGH (ref 0.00–0.50)

## 2024-02-27 MED ORDER — ACETAMINOPHEN 325 MG PO TABS
650.0000 mg | ORAL_TABLET | Freq: Four times a day (QID) | ORAL | Status: DC | PRN
Start: 2024-02-27 — End: 2024-02-29
  Administered 2024-02-27 – 2024-02-28 (×3): 650 mg via ORAL
  Filled 2024-02-27 (×4): qty 2

## 2024-02-27 MED ORDER — NICOTINE 7 MG/24HR TD PT24
7.0000 mg | MEDICATED_PATCH | Freq: Every day | TRANSDERMAL | Status: DC
  Administered 2024-02-28 – 2024-03-05 (×7): 7 mg via TRANSDERMAL
  Filled 2024-02-27 (×8): qty 1

## 2024-02-27 MED ORDER — ACETAMINOPHEN 650 MG RE SUPP: 650.0000 mg | Freq: Four times a day (QID) | RECTAL | Status: DC | PRN

## 2024-02-27 MED ORDER — IRON SUCROSE 20 MG/ML IV SOLN
200.0000 mg | INTRAVENOUS | Status: DC
  Administered 2024-02-27: 200 mg via INTRAVENOUS
  Filled 2024-02-27 (×2): qty 10

## 2024-02-27 MED ORDER — LABETALOL HCL 5 MG/ML IV SOLN
10.0000 mg | Freq: Once | INTRAVENOUS | Status: AC
  Administered 2024-02-27: 10 mg via INTRAVENOUS
  Filled 2024-02-27: qty 4

## 2024-02-27 MED ORDER — IRON SUCROSE 200 MG IVPB - SIMPLE MED
200.0000 mg | Status: DC
Start: 2024-02-27 — End: 2024-02-27

## 2024-02-27 MED ORDER — IRON SUCROSE 200 MG IVPB - SIMPLE MED
200.0000 mg | Status: DC
  Filled 2024-02-27: qty 110

## 2024-02-27 MED ORDER — METOPROLOL TARTRATE 5 MG/5ML IV SOLN: 5.0000 mg | Freq: Once | INTRAVENOUS | Status: DC

## 2024-02-27 MED ORDER — NAPROXEN 250 MG PO TABS
500.0000 mg | ORAL_TABLET | Freq: Once | ORAL | Status: AC
  Administered 2024-02-27: 500 mg via ORAL
  Filled 2024-02-27: qty 2

## 2024-02-27 MED ORDER — ENOXAPARIN SODIUM 40 MG/0.4ML IJ SOSY
40.0000 mg | PREFILLED_SYRINGE | INTRAMUSCULAR | Status: DC
  Administered 2024-02-28: 40 mg via SUBCUTANEOUS
  Filled 2024-02-27 (×2): qty 0.4

## 2024-02-27 MED ORDER — CARVEDILOL 6.25 MG PO TABS
6.2500 mg | ORAL_TABLET | Freq: Two times a day (BID) | ORAL | Status: DC
  Administered 2024-02-27 – 2024-02-29 (×4): 6.25 mg via ORAL
  Filled 2024-02-27 (×5): qty 1

## 2024-02-27 MED ORDER — LOSARTAN POTASSIUM 25 MG PO TABS
25.0000 mg | ORAL_TABLET | Freq: Every day | ORAL | Status: DC
  Administered 2024-02-27 – 2024-03-05 (×8): 25 mg via ORAL
  Filled 2024-02-27 (×8): qty 1

## 2024-02-27 MED ORDER — LACTATED RINGERS IV BOLUS
1000.0000 mL | Freq: Once | INTRAVENOUS | Status: AC
  Administered 2024-02-27: 1000 mL via INTRAVENOUS
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
## 2024-02-27 NOTE — ED Notes (Signed)
 Pt refusing CT and xray

## 2024-02-27 NOTE — ED Provider Notes (Signed)
 [place] EMERGENCY DEPARTMENT AT [facility] Provider Note   CSN: [id number] Arrival date & time: 02/27/24  9682     Patient presents with: Chest Pain   Mary Frey is a 48 y.o. female.   The history is provided by the patient.  Chest Pain  She has history of hypertension, Graves' disease and comes in complaining of shortness of breath and chest pain over the last 4 days.  Pain also goes into her back.  When asked to characterize her pain, she said it was sharp, then said it was pressure.  She states that she is not on any medication for Graves' disease because she is allergic to it.  She also states that she has not been taking metoprolol for several months.  She last saw her endocrinologist in March, missed an appointment that was scheduled for last week.  She denies fever.  There has been a slight cough which is nonproductive.    Prior to Admission medications   Medication Sig Start Date End Date Taking? Authorizing Provider  atenolol  (TENORMIN ) 50 MG tablet Take 1 tablet (50 mg total) by mouth 2 (two) times daily. 09/18/22   Harris, Abigail, PA-C  cholestyramine  light (PREVALITE ) 4 g packet Take 1 packet (4 g total) by mouth 2 (two) times daily. Mix with 4-6 oz liquid.  Take other meds 1 hr before or 4-6 hr after cholestyramine . 05/13/21 06/12/21  Sebastian Toribio GAILS, MD  clindamycin  (CLEOCIN ) 300 MG capsule Take 1 capsule (300 mg total) by mouth 4 (four) times daily. X 7 days 08/14/22   Mesner, Jason, MD  diclofenac  Sodium (VOLTAREN ) 1 % GEL Apply 2 g topically 4 (four) times daily. 09/18/22   Harris, Abigail, PA-C  diphenoxylate -atropine  (LOMOTIL ) 2.5-0.025 MG tablet Take 1 tablet by mouth 4 (four) times daily as needed for up to 20 doses for diarrhea or loose stools. 11/03/19   Christobal Guadalajara, MD  fenofibrate 54 MG tablet Take 54 mg by mouth daily. 04/26/21   [provider]  FEROSUL 325 (65 Fe) MG tablet Take 325 mg by mouth daily. 10/25/19   [provider]   fluticasone  (FLONASE ) 50 MCG/ACT nasal spray Place 2 sprays into both nostrils daily. 05/13/21   Sebastian Toribio GAILS, MD  hydrochlorothiazide (HYDRODIURIL) 25 MG tablet Take 25 mg by mouth 3 (three) times daily. 04/12/21   [provider]  hydrOXYzine  (ATARAX ) 25 MG tablet Take 1 tablet (25 mg total) by mouth every 8 (eight) hours as needed for itching or anxiety. 05/13/21   Sebastian Toribio GAILS, MD  loratadine  (CLARITIN ) 10 MG tablet Take 1 tablet (10 mg total) by mouth daily. 05/14/21   Sebastian Toribio GAILS, MD  melatonin 5 MG TABS Take 1 tablet (5 mg total) by mouth at bedtime. 05/13/21   Sebastian Toribio GAILS, MD  Multiple Vitamins-Minerals (MULTIVITAMIN ADULTS PO) Take 1 tablet by mouth daily.    [provider]  naproxen  (NAPROSYN ) 500 MG tablet Take 500 mg by mouth 2 (two) times daily as needed. 05/09/21   [provider]  oxyCODONE -acetaminophen  (PERCOCET) 5-325 MG tablet Take 2 tablets by mouth every 4 (four) hours as needed. 08/14/22   Mesner, Jason, MD  triamcinolone cream (KENALOG) 0.1 % Apply 1 application topically 2 (two) times daily. 04/26/21   [provider]  valsartan (DIOVAN) 80 MG tablet Take 80 mg by mouth daily. 04/12/21   [provider]  norethindrone  (MICRONOR ,CAMILA ,ERRIN ) 0.35 MG tablet Take 1 tablet (0.35 mg total) by mouth  daily. Patient not taking: Reported on 06/27/2015 03/30/14 03/30/19  Rogelio Planas, MD  propranolol  (INDERAL ) 20 MG tablet Take 1 tablet (20 mg total) by mouth 2 (two) times daily. Patient not taking: Reported on 06/27/2015 08/12/13 03/30/19  Trixie File, MD    Allergies: Latex, Methimazole , Ptu [propylthiouracil], Hydrocodone, and Hydrocodone-acetaminophen     Review of Systems  Cardiovascular:  Positive for chest pain.  All other systems reviewed and are negative.   Updated Vital Signs BP (!) 144/111 (BP Location: Right Arm)   Pulse (!) 134   Temp 97.6 F (36.4 C)   Resp 20   Ht 5' 5 (1.651 m)   Wt  53.5 kg   SpO2 99%   BMI 19.64 kg/m   Physical Exam Vitals and nursing note reviewed.   48 year old female, resting comfortably and in no acute distress. Vital signs are significant for elevated heart rate and elevated diastolic blood pressure. Oxygen saturation is 100%, which is normal. Head is normocephalic and atraumatic. PERRLA, EOMI.  Mild proptosis is present.  No lid lag seen. Neck is nontender and supple without adenopathy. Lungs are clear without rales, wheezes, or rhonchi. Chest is nontender. Heart is tachycardic without murmur. Abdomen is soft, flat, nontender. Extremities have no cyanosis or edema, full range of motion is present. Skin is warm and dry without rash. Neurologic: Mental status is normal, cranial nerves are intact, moves all extremities equally.  (all labs ordered are listed, but only abnormal results are displayed) Labs Reviewed  BASIC METABOLIC PANEL WITH GFR - Abnormal; Notable for the following components:      Result Value   Sodium 134 (*)    CO2 18 (*)    All other components within normal limits  CBC - Abnormal; Notable for the following components:   Hemoglobin 9.2 (*)    HCT 29.8 (*)    MCV 61.7 (*)    MCH 19.0 (*)    RDW 19.0 (*)    All other components within normal limits  D-DIMER, QUANTITATIVE - Abnormal; Notable for the following components:   D-Dimer, Quant 1.94 (*)    All other components within normal limits  TROPONIN I (HIGH SENSITIVITY) - Abnormal; Notable for the following components:   Troponin I (High Sensitivity) 88 (*)    All other components within normal limits  HCG, SERUM, QUALITATIVE  TSH  T4, FREE  T3, FREE  BRAIN NATRIURETIC PEPTIDE  TROPONIN I (HIGH SENSITIVITY)  TROPONIN I (HIGH SENSITIVITY)    EKG: EKG Interpretation Date/Time:  Friday February 27 2024 03:33:25 EST Ventricular Rate:  131 PR Interval:  144 QRS Duration:  70 QT Interval:  278 QTC Calculation: 410 R Axis:   70  Text Interpretation: Sinus  tachycardia Right atrial enlargement Anterior infarct , age undetermined ST elevation in Inferior leads Abnormal ECG When compared with ECG of 11-May-2021 20:00, HEART RATE has increased Confirmed by Raford Lenis ([id number]) on 02/27/2024 5:31:19 AM  Radiology: No results found.   Procedures   Medications Ordered in the ED - No data to display  Clinical Course as of 02/27/24 0718  [facility] Feb 27, 2024  0703 Received signout; pending CTA. H/o of hyperthyroid.  [TY]    Clinical Course User Index [TY] Neysa Caron PARAS, DO                                 Medical Decision Making Amount and/or Complexity of Data Reviewed  Labs: ordered. Radiology: ordered.  Risk Prescription drug management.   Shortness of breath with tachycardia.  This is a presentation with a wide range of treatment options and carries with it at high risk of morbidity and complications.  Differential diagnosis includes, but is not limited to, ACS, pulmonary embolism, pneumonia, thyroid  storm.  She is maintaining excellent oxygen saturation on room air and not using accessory muscles of respiration.  I have initiated laboratory workup including D-dimer, troponin, BNP.  I have reviewed her initial laboratory tests, my interpretation is stable anemia, borderline hyponatremia which is not felt to be clinically significant.  I have reviewed her past records, and I see no relevant past visits.  I have reviewed her electrocardiogram, my interpretation is ST elevation in the inferior and anterolateral leads unchanged from prior, sinus tachycardia which is new.  Troponin has come back mildly elevated which is felt to be secondary to demand ischemia, repeat troponin ordered.  D-dimer has come back significantly elevated, CT angiogram ordered.  Case is signed out to Dr. Neysa.  CRITICAL CARE Performed by: Alm Lias Total critical care time: 45 minutes Critical care time was exclusive of separately billable procedures and treating other  patients. Critical care was necessary to treat or prevent imminent or life-threatening deterioration. Critical care was time spent personally by me on the following activities: development of treatment plan with patient and/or surrogate as well as nursing, discussions with consultants, evaluation of patient's response to treatment, examination of patient, obtaining history from patient or surrogate, ordering and performing treatments and interventions, ordering and review of laboratory studies, ordering and review of radiographic studies, pulse oximetry and re-evaluation of patient's condition.      Final diagnoses:  Shortness of breath  Sinus tachycardia  Elevated troponin  Elevated d-dimer  Microcytic anemia  Hyponatremia    ED Discharge Orders     None          Lias Alm, MD 02/27/24 [phone]

## 2024-02-27 NOTE — ED Notes (Signed)
 Patient refusing labs at this time, stating she is too weak to lose more blood , MD made aware.

## 2024-02-27 NOTE — ED Notes (Signed)
 Pt refusing blood work

## 2024-02-27 NOTE — Hospital Course (Addendum)
   Chest pain Troponin elevation      118/80 Normal mentation No evidence of fever, arrhythmias, encephalopathy, GI symptoms,   BNP elevated - Heat intolerance: + occasional heat intolerance - Weight changes: none  - Tremor: none - Palpitations: none  - Nervousness/mood changes: + insomnia, anxiety (almost had a panic attack a few weeks ago) - Changes in BM: none - Menstrual cycles: + regular periods   Eye symptoms: + proptosis, blurry vision, photophobia; no pain with movement  - Neck enlargement: none - Dysphagia, hoarseness: only notes hoarseness if she gets too much air   Polysubstance use disorder Cocaine  Tobacco use  BNP ~700  Graves disease Toxic diffuse goiter Diagnosed in 2014 - Dx in 2014 while hospitalized reportedly with CHF. Started on MMI and propranolol  with resolution of hyperthyroid symptoms - Discontinued MMI in 03/2019 due to weight gain and fatigue - 08/2019 established with a new Endocrinologist, Dr. Donnice Lipps, in [place], [place] with recurrent hyperthyroid symptoms. Resumed MMI 10 mg bid and atenolol , but was only able to take a few doses (2-3 doses?) prior to self-discontinuing due to lack of improvement in symptoms. - 10/2019-11/2019 was hospitalized with severe transaminitis (Tbili 9.5, Alk Phos 200, AST 1467, ALT 883) and ?neutropenia (though in the setting of concurrent severe Vitamin B12 deficiency causing pancytopenia). TSH was < 0.01 during this time as well; she had been OFF methimazole  as well. Left hospital AMA, re-presented with worsening Tbili elevation despite being off MMI (Tbili peaked at 30).  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    e is unable to take PTU due to similar side effect profiles   Hypertension   11/9: Overnight had a lot of anxiety, but Atarax  did help. She is  agreeable to continuing the steroid plan. Discussed with patient regarding recommendation of 24 continuous heart monitoring. She states that the monitors hurt her chest.   - ordered heating pad - ordered Tylenol  scheduled - ordered voltaren

## 2024-02-27 NOTE — ED Triage Notes (Addendum)
 C/o headache onset 3 hours ago, c/o slight chest pain onset yest , worse with walking c/o sob, states she had a very bad panic attack 10/31 and hasn't felt right since. C/o feeling like she had congestion in her chest

## 2024-02-27 NOTE — H&P (Signed)
 Date: 02/27/2024               Patient Name:  Mary Frey MRN: [id number]  DOB: 1975/09/17 Age / Sex: 48 y.o., female   PCP: Patient, No Pcp Per         Medical Service: Internal Medicine Teaching Service         Attending Physician: Dr. MICAEL Riis Winfrey      First Contact: Rebecka Pion, DO}    Second Contact: Dr. Drue Grow, MD         Pager Information: First Contact Pager: [phone]   Second Contact Pager: [phone]   SUBJECTIVE   Chief Complaint: chest pain and shortness of breath  History of Present Illness: Mary Frey is a 48 y.o. 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 This is when she first noticed shortness of breath when walking or exerting herself. No overt orthopnes or PND. Has noticed her abdomen mildy distended, but no significant lower extremity swelling. Initially she did not have chest pain. This only happened yesterday after going out with friends. She tells me that though she has decreased the frequency of cocaine use, she snorted cocaine last night as well as some alcohol  use. She denies persistent EtOH use or IV drugs.   Regarding her hyperthyroidism, tells me that has not noticed overt heat intolerance or fevers, weight hanges, tremors. No anxiety or mood changes. No GI abnormalities. Cannot sleep at times, especially with cocaine. Has not has her menstrual period this month. She feels her rapid pulse all the time - previously on atenolol , but she has not refilled int he ~6 months as she did not make it to her follow up appointment in [place].   No current chest pain, shortness of breath, cough, changes in vision, or syncope. Has not been  somnolent or aspirating. No recent long distance travel; has been in [place]. Has discomfort in bilateral nostrils from cocaine inhalation and some nasal congestion. No cough. No abdominal pain, dysuria. No current vaginal discharges but would like to be tested for STIs. No currently sexually active, not on birth control.   ED Course: Labs significant for tachycardia, hypertension, normal renal function. troponin elevation 88 > 94, EKG with sinus tachycardia, r atrial enlargement by amplitude of P wave on lead II. BNP ~700. Microcytic anemia.  DDMER 1.94. undetectable TSH, Ft4 2.39. UDS + cocaine. Patient admitted to IMTS for further evaluation. Suspicion for impending thyrotoxicosis vs  Meds:  Patient reported: None  No outpatient medications have been marked as taking for the 02/27/24 encounter [facility] Encounter).    Past Medical History Graves disease Drug induced liver HTN HFpEF and congestive hepatopathy  Past Surgical History Past Surgical History:  Procedure Laterality Date   IR US  GUIDE BX ASP/DRAIN  10/29/2019   NO PAST SURGERIES       Social:  Lives alone Occupation: unemployed Support: friends Level of Function:independent with iADLS and ADLs. No mobility aides PCP:  Patient, No Pcp Per  Substances: -Tobacco: intermittently - THC products and cocaine -Alcohol : socially, never admitted for AUD or withdrawal  Family History:  Family History  Problem Relation Age of Onset   Diabetes Maternal Aunt  Diabetes Maternal Uncle    Diabetes Maternal Grandmother    Diabetes Maternal Grandfather    Stroke Paternal Grandfather    Breast cancer Mother      Allergies: Allergies as of 02/27/2024 - Review Complete 02/27/2024  Allergen Reaction Noted   Latex Itching, Rash, and Other (See Comments) 10/01/2011   Methimazole  Diarrhea 10/14/2019   Ptu [propylthiouracil] Other (See Comments) 10/14/2019   Hydrocodone Nausea And Vomiting 10/01/2011   Hydrocodone-acetaminophen   Other (See Comments) 02/19/2018    Review of Systems: A complete ROS was negative except as per HPI.   OBJECTIVE:   Physical Exam: Blood pressure (!) 117/100, pulse (!) 127, temperature 98.3 F (36.8 C), temperature source Oral, resp. rate 18, height 5' 5 (1.651 m), weight 53.5 kg, SpO2 100%.  Constitutional: well-appearing woman sitting in bed, in no acute distress HENT: normocephalic atraumatic, mild bilateral proptosis mucous membranes moist Eyes: conjunctiva non-erythematous Neck: supple, multinodular thyroid  gland palpated, without tenderness. JVP to midneck Cardiovascular: tachycardia without murmurs. no m/r/g Pulmonary/Chest: normal work of breathing on room air, lungs clear to auscultation bilaterally Abdominal: soft, non-tender, non-distended, no shifting dullness MSK: normal bulk and tone. Traced edema Neurological: alert & oriented x 3, 5/5 strength in bilateral upper and lower extremities Skin: warm and dry Psych: Inquisitive, pleasant mood and affect  Labs: CBC    Component Value Date/Time   WBC 6.5 02/27/2024 0347   RBC 4.83 02/27/2024 0347   HGB 9.2 (L) 02/27/2024 0347   HCT 29.8 (L) 02/27/2024 0347   PLT 258 02/27/2024 0347   MCV 61.7 (L) 02/27/2024 0347   MCH 19.0 (L) 02/27/2024 0347   MCHC 30.9 02/27/2024 0347   RDW 19.0 (H) 02/27/2024 0347   LYMPHSABS 2.3 09/18/2022 0633   MONOABS 0.7 09/18/2022 0633   EOSABS 0.1 09/18/2022 0633   BASOSABS 0.0 09/18/2022 0633     CMP     Component Value Date/Time   NA 134 (L) 02/27/2024 0347   K 4.2 02/27/2024 0347   CL 103 02/27/2024 0347   CO2 18 (L) 02/27/2024 0347   GLUCOSE 96 02/27/2024 0347   BUN 9 02/27/2024 0347   CREATININE 0.46 02/27/2024 0347   CALCIUM 8.9 02/27/2024 0347   PROT 7.7 09/18/2022 0633   ALBUMIN 3.1 (L) 09/18/2022 0633   AST 742 (H) 09/18/2022 0633   ALT 340 (H) 09/18/2022 0633   ALKPHOS 246 (H) 09/18/2022 0633   BILITOT 2.0 (H) 09/18/2022 0633   GFRNONAA >60 02/27/2024 0347    GFRAA NOT CALCULATED 10/31/2019 0739    Imaging: DG CHEST PORT 1 VIEW Result Date: 02/27/2024 EXAM: 1 VIEW(S) XRAY OF THE CHEST 02/27/2024 11:26:00 AM COMPARISON: 05/11/2021 CLINICAL HISTORY: Dyspnea FINDINGS: LUNGS AND PLEURA: No focal pulmonary opacity. No pulmonary edema. No pleural effusion. No pneumothorax. HEART AND MEDIASTINUM: Mild cardiomegaly. No acute abnormality of the cardiac and mediastinal silhouettes. BONES AND SOFT TISSUES: No acute osseous abnormality. IMPRESSION: 1. No acute cardiopulmonary process. Electronically signed by: Lynwood Seip MD 02/27/2024 12:33 PM EST RP Workstation: [id number]     EKG: personally reviewed my interpretation is sinus tachycardia. Prior EKG Jan 2023.  ASSESSMENT & PLAN:   Assessment & Plan by Problem: 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   Hyperthyroidism  Mary Frey is a 48 y.o. female with PMH of Graves disease/hyperthyroidism not currently on therapy c/b drug induced liver injury secondary  to methimazole  therapy, HTN, HLD, polysubstance use disorder, who used to receive care in [place], presented to the [facility] with chest pain and progressive shortness of breath now found to have new HFrEF, uncontrolled hyperthyroidism without overt features of thyrotoxicosis, and recent polysubstance use  HFrEF New. Suspect secondary to untreated, uncontrolled hyperthyroidism, tachycardia-induced, or polysubtance use. Troponin leak during admission without signs of ischemia on EKG, likely due to demand. BNP ~700. Vascular congestion on CXR. TTE final read not done yet, but reviewed the windows, with ~40% LVEF. Prior TTEs with LVEF >60% in 2023. No signs of low output. On RA. Given co-morbidities, will consult cardiology to assist with diuresis and ongoing management. - Trial Lasix  20 mg IV now - Carvedilol chosen for HFrEF and hyperparathyroidism  symptom control - I/Os and daily weights - Goal K >4, Mg >2 - Appreciate cardiology recommendations - Hepatic function panel added on; follow up  Graves disease, dx 2014 Hyperthyroidism Toxic diffuse goiter Hx of drug induced liver injury No overt signs of thryoid storm. Persistent tachycardia likely in setting of no medications given previously documented drug induced liver injury. Previously followed by Dr. Beryl in [place]. 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 This was thought to be secondary to Methimazole . Did not transition to PTU as has similar side effect profile. Patient did not want iodine ablation. Currently considering surgery but remains unsure. Burch-Wartofsky Point Scale (BWPS) for Thyrotoxicosis ~30; however, this is likely confounded by cocaine use. Will treat her symptoms currently. - BB as above  - Will need to remain cautiously vigilnt for hyperthermia, arrhythmias, encephalopathy, GI effects to suspect thyroid  storm - Will need surgery if amenable; may need to consult ENT if it can be done in hospital vs transfer to terciary center for same  Tachycardia Suspect related to hyperthyroidism.Ddmer obtained in the ED midly elevated. Low suspicion for PE at this time. It is likely this mild elevation is secondary to new HFrEF. Will monitor for signs of right heart strain on the final TTE report. Hold off on CTA PE at this time - Continue to monitor with changes above - Follow up TTE report for RHS  Microcytic anemia Hgb 9.2 similar to prior.  With new HFrEF, will check ferritin and iron studies and transfuse during this admission. Nutritional deficiencies also suspected - Follow up iron studies - Reticulocyte count, smear, and diff added on  Polysubstance use disorder Cocaine, THC, tobacco use - Cessation  encourage - TOC consulted   STI testing requested - G/C - HIV ordered  Best practice: Diet: Regular VTE: Enoxaparin  Code: Full  Disposition planning: Dispo: Admit patient to Observation with expected length of stay less than 2 midnights.  Signed: Elnora Ip, MD Internal Medicine Resident  02/27/2024, 12:33 PM  On Call pager: [phone]

## 2024-02-27 NOTE — Consult Note (Addendum)
 Cardiology Consultation   Patient ID: Mary Frey MRN: [id number]; DOB: November 09, 1975  Admit date: 02/27/2024 Date of Consult: 02/27/2024  PCP:  Patient, No Pcp Per   [facility] Providers Cardiologist:  None      Patient Profile: Mary Frey is a 48 y.o. female with a hx of Grave's disease/Hyperthyroidism not currently on therapy, HTN, HLD, polysubstance use who is being seen 02/27/2024 for the evaluation of CHF at the request of Dr. Francesco.  History of Present Illness: Mary Frey is a 48 year old female with above medical history.  Patient previously had an echocardiogram in 12/2012 that showed EF 25-30%.  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  Started on propranolol  and instructed to follow-up with endocrinology.  Patient did not follow-up with cardiology  Later, echocardiogram in 04/2021 showed EF 60-65%, no regional wall motion abnormalities, normal RV systolic function, mild MR.  Patient presented to the ED on 11/7 around 3 AM.  Reported having a headache, chest pain, shortness of breath.  Patient admitted that the night prior to her presentation, she had drank alcohol  and used cocaine.  Initial vital signs in ED significant for BP 144/111, pulse 134 bpm, oxygen 99% on room air.  BNP elevated to 721.  High-sensitivity troponin 88> 94.  D-dimer elevated to 1.94.  However, patient refused CTA chest.  TSH undetectable and free T4 elevated 2.39.  Drug screen positive for cocaine.  Chest x-ray with no acute cardiopulmonary process.  Echocardiogram showed EF 40-45%, mild LVH, normal RV systolic function, moderately elevated PA systolic pressure, moderate-severe MR  Patient was admitted to the internal medicine teaching service for hyperthyroidism, tachycardia, CHF.  Cardiology consulted.  Patient asleep at the time of my interview, awakens easily to voice.  However, remains sleepy throughout the  duration of her interview.  Answers and mostly one-word answers.  She denies having chest pain at this time.  Tells me that she has been having episodes of chest pain throughout this week.  She has left upper quadrant abdominal pain which is worse on palpation.  She has also been having some pain on her left chest that is worse if she smokes or drinks alcohol .  She also notices this chest pain if she overexerts herself.  She admits to using cocaine last night.  She is aware that her heart rate is elevated.  She has been out of her thyroid  medications, believes that this is contributing to her fast heart rate.  Past Medical History:  Diagnosis Date   Anemia    Chronic back pain    Fibroids    Graves disease    Hypertension    Pregnancy induced hypertension     Past Surgical History:  Procedure Laterality Date   IR US  GUIDE BX ASP/DRAIN  10/29/2019   NO PAST SURGERIES       Home Medications:  Prior to Admission medications   Medication Sig Start Date End Date Taking? Authorizing Provider  atenolol  (TENORMIN ) 50 MG tablet Take 1 tablet (50 mg total) by mouth 2 (two) times daily. 09/18/22   Harris, Abigail, PA-C  cholestyramine  light (PREVALITE ) 4 g packet Take 1 packet (4 g total) by mouth 2 (two) times daily. Mix with 4-6 oz liquid.  Take other meds 1 hr before or 4-6 hr after cholestyramine . 05/13/21 06/12/21  Sebastian Toribio GAILS, MD  clindamycin  (CLEOCIN ) 300 MG capsule Take 1 capsule (300 mg total) by mouth 4 (four)  times daily. X 7 days 08/14/22   Mesner, Selinda, MD  diclofenac  Sodium (VOLTAREN ) 1 % GEL Apply 2 g topically 4 (four) times daily. 09/18/22   Harris, Abigail, PA-C  diphenoxylate -atropine  (LOMOTIL ) 2.5-0.025 MG tablet Take 1 tablet by mouth 4 (four) times daily as needed for up to 20 doses for diarrhea or loose stools. 11/03/19   Christobal Guadalajara, MD  fenofibrate 54 MG tablet Take 54 mg by mouth daily. 04/26/21   [provider]  FEROSUL 325 (65 Fe) MG tablet Take 325 mg by mouth  daily. 10/25/19   [provider]  fluticasone  (FLONASE ) 50 MCG/ACT nasal spray Place 2 sprays into both nostrils daily. 05/13/21   Sebastian Toribio GAILS, MD  hydrochlorothiazide (HYDRODIURIL) 25 MG tablet Take 25 mg by mouth 3 (three) times daily. 04/12/21   [provider]  hydrOXYzine  (ATARAX ) 25 MG tablet Take 1 tablet (25 mg total) by mouth every 8 (eight) hours as needed for itching or anxiety. 05/13/21   Sebastian Toribio GAILS, MD  loratadine  (CLARITIN ) 10 MG tablet Take 1 tablet (10 mg total) by mouth daily. 05/14/21   Sebastian Toribio GAILS, MD  melatonin 5 MG TABS Take 1 tablet (5 mg total) by mouth at bedtime. 05/13/21   Sebastian Toribio GAILS, MD  Multiple Vitamins-Minerals (MULTIVITAMIN ADULTS PO) Take 1 tablet by mouth daily.    [provider]  naproxen  (NAPROSYN ) 500 MG tablet Take 500 mg by mouth 2 (two) times daily as needed. 05/09/21   [provider]  oxyCODONE -acetaminophen  (PERCOCET) 5-325 MG tablet Take 2 tablets by mouth every 4 (four) hours as needed. 08/14/22   Mesner, Jason, MD  triamcinolone cream (KENALOG) 0.1 % Apply 1 application topically 2 (two) times daily. 04/26/21   [provider]  valsartan (DIOVAN) 80 MG tablet Take 80 mg by mouth daily. 04/12/21   [provider]  norethindrone  (MICRONOR ,CAMILA ,ERRIN ) 0.35 MG tablet Take 1 tablet (0.35 mg total) by mouth daily. Patient not taking: Reported on 06/27/2015 03/30/14 03/30/19  Rogelio Planas, MD  propranolol  (INDERAL ) 20 MG tablet Take 1 tablet (20 mg total) by mouth 2 (two) times daily. Patient not taking: Reported on 06/27/2015 08/12/13 03/30/19  Trixie File, MD    Scheduled Meds:  enoxaparin  (LOVENOX ) injection  40 mg Subcutaneous Q24H   nicotine  7 mg Transdermal Daily   Continuous Infusions:  PRN Meds: acetaminophen  **OR** acetaminophen   Allergies:    Allergies  Allergen Reactions   Latex Itching, Rash and Other (See Comments)    Reaction Type: Allergy    Methimazole  Diarrhea    Liver toxicity    Ptu [Propylthiouracil] Other (See Comments)    Liver toxicity    Hydrocodone Nausea And Vomiting   Hydrocodone-Acetaminophen  Other (See Comments)    Reaction Type: Allergy    Social History:   Social History   Socioeconomic History   Marital status: Single    Spouse name: Not on file   Number of children: Not on file   Years of education: Not on file   Highest education level: Not on file  Occupational History   Not on file  Tobacco Use   Smoking status: Light Smoker    Types: Cigarettes   Smokeless tobacco: Never  Substance and Sexual Activity   Alcohol  use: Yes    Alcohol /week: 0.0 standard drinks of alcohol     Comment: on occasion   Drug use: Yes    Types: Marijuana   Sexual activity: Not Currently  Other Topics Concern  Not on file  Social History Narrative   Regular exercise: walk   Caffeine use: 2 times a week   Social Drivers of Corporate Investment Banker Strain: Not on file  Food Insecurity: Not on file  Transportation Needs: Not on file  Physical Activity: Not on file  Stress: Not on file  Social Connections: Unknown (09/02/2021)   Received from [facility]   Social Network    Social Network: Not on file  Intimate Partner Violence: Unknown (07/26/2021)   Received from [facility]   HITS    Physically Hurt: Not on file    Insult or Talk Down To: Not on file    Threaten Physical Harm: Not on file    Scream or Curse: Not on file    Family History:    Family History  Problem Relation Age of Onset   Diabetes Maternal Aunt    Diabetes Maternal Uncle    Diabetes Maternal Grandmother    Diabetes Maternal Grandfather    Stroke Paternal Grandfather    Breast cancer Mother      ROS:  Please see the history of present illness.   All other ROS reviewed and negative.     Physical Exam/Data: Vitals:   02/27/24 0914 02/27/24 1000 02/27/24 1030 02/27/24 1130  BP:  (!) 135/91 (!) 117/100   Pulse:  (!)  117 (!) 119 (!) 127  Resp:  (!) 23 18   Temp: 98.3 F (36.8 C)     TempSrc: Oral     SpO2:  99% 100%   Weight:      Height:       No intake or output data in the 24 hours ending 02/27/24 1330    02/27/2024    3:32 AM 10/15/2023    3:34 PM 09/02/2022   12:07 AM  Last 3 Weights  Weight (lbs) 118 lb 117 lb 15.1 oz 118 lb  Weight (kg) 53.524 kg 53.5 kg 53.524 kg     Body mass index is 19.64 kg/m.  General: Thin middle-aged female.  Laying in the bed.  Asleep, wakes up easily to voice HEENT: normal Neck: no JVD Vascular:  Radial pulses 2+ bilaterally Cardiac:  normal S1, S2; Regular rhythm, tachycardic  Lungs:  clear to auscultation bilaterally, no wheezing, rhonchi or rales. Normal WOB on room air   Abd: soft, tender to palpation in LUQ Ext: no edema in BLE Musculoskeletal:  No deformities  Skin: warm and dry  Neuro:  CNs 2-12 intact, no focal abnormalities noted Psych:  Normal affect   EKG:  The EKG was personally reviewed and demonstrates: Sinus tachycardia with heart rate 131 bpm, baseline artifact/wandering baseline present which interferes with interpretation Telemetry:  Telemetry was personally reviewed and demonstrates: Sinus tachycardia, heart rate in the 110s  Relevant CV Studies: Cardiac Studies & Procedures   ______________________________________________________________________________________________     ECHOCARDIOGRAM  ECHOCARDIOGRAM COMPLETE 02/27/2024  Narrative ECHOCARDIOGRAM REPORT    Patient Name:   Mary Frey Chao Date of Exam: 02/27/2024 Medical Rec #:  [id number]        Height:       65.0 in Accession #:    [id number]       Weight:       118.0 lb Date of Birth:  Oct 18, 1975         BSA:          1.581 m Patient Age:    48 years         BP:  117/100 mmHg Patient Gender: F                HR:           118 bpm. Exam Location:  Inpatient  Procedure: 2D Echo, Cardiac Doppler and Color Doppler (Both Spectral and Color Flow Doppler were  utilized during procedure).  Indications:    Dyspnea R06.00  History:        Patient has prior history of Echocardiogram examinations, most recent 05/12/2021. Risk Factors:Hypertension.  Sonographer:    Tinnie Gosling RDCS Referring Phys: [phone] TRAVIS J YOUNG  IMPRESSIONS   1. Left ventricular ejection fraction, by estimation, is 40 to 45%. The left ventricle has mildly decreased function. The left ventricle demonstrates global hypokinesis. There is mild left ventricular hypertrophy. 2. Right ventricular systolic function is normal. The right ventricular size is normal. There is moderately elevated pulmonary artery systolic pressure. The estimated right ventricular systolic pressure is 46.7 mmHg. 3. The mitral valve is grossly normal. Moderate to severe mitral valve regurgitation. No evidence of mitral stenosis. 4. The aortic valve is normal in structure. Aortic valve regurgitation is not visualized. No aortic stenosis is present. 5. The inferior vena cava is normal in size with <50% respiratory variability, suggesting right atrial pressure of 8 mmHg.  Comparison(s): A prior study was performed on 05/12/2021. LVEF 60 to 65%, normal diastolic function, mild MR, small gradient across the LVOT/aortic valve without obvious subaortic web and valve appears to open, some chordal SAM and hyperdynamic LVEF which may be contributory. See report for additional details.  Conclusion(s)/Recommendation(s): Findings concerning for Mitral regurgitation, would recommend Transesophageal Echocardiogram for clarification.  FINDINGS Left Ventricle: Left ventricular ejection fraction, by estimation, is 40 to 45%. The left ventricle has mildly decreased function. The left ventricle demonstrates global hypokinesis. The left ventricular internal cavity size was normal in size. There is mild left ventricular hypertrophy.  Right Ventricle: The right ventricular size is normal. No increase in right ventricular wall  thickness. Right ventricular systolic function is normal. There is moderately elevated pulmonary artery systolic pressure. The tricuspid regurgitant velocity is 3.11 m/s, and with an assumed right atrial pressure of 8 mmHg, the estimated right ventricular systolic pressure is 46.7 mmHg.  Left Atrium: Left atrial size was normal in size.  Right Atrium: Right atrial size was normal in size.  Pericardium: There is no evidence of pericardial effusion.  Mitral Valve: The mitral valve is grossly normal. Moderate to severe mitral valve regurgitation. No evidence of mitral valve stenosis.  Tricuspid Valve: The tricuspid valve is normal in structure. Tricuspid valve regurgitation is mild . No evidence of tricuspid stenosis.  Aortic Valve: The aortic valve is normal in structure. Aortic valve regurgitation is not visualized. No aortic stenosis is present.  Pulmonic Valve: The pulmonic valve was grossly normal. Pulmonic valve regurgitation is not visualized. No evidence of pulmonic stenosis.  Aorta: The aortic root and ascending aorta are structurally normal, with no evidence of dilitation.  Venous: The inferior vena cava is normal in size with less than 50% respiratory variability, suggesting right atrial pressure of 8 mmHg.  IAS/Shunts: The atrial septum is grossly normal.   LEFT VENTRICLE PLAX 2D LVIDd:         4.70 cm LVIDs:         3.70 cm LV PW:         1.10 cm LV IVS:        1.10 cm LVOT diam:     1.70 cm LV  SV:         37 LV SV Index:   24 LVOT Area:     2.27 cm LV IVRT:       106 msec   RIGHT VENTRICLE         IVC TAPSE (M-mode): 2.2 cm  IVC diam: 1.40 cm  LEFT ATRIUM           Index        RIGHT ATRIUM           Index LA diam:      4.10 cm 2.59 cm/m   RA Area:     11.90 cm LA Vol (A4C): 52.3 ml 33.09 ml/m  RA Volume:   25.10 ml  15.88 ml/m AORTIC VALVE LVOT Vmax:   118.00 cm/s LVOT Vmean:  77.900 cm/s LVOT VTI:    0.165 m  AORTA Ao Root diam: 2.40 cm Ao Asc diam:   3.00 cm  TRICUSPID VALVE TR Peak grad:   38.7 mmHg TR Vmax:        311.00 cm/s  SHUNTS Systemic VTI:  0.16 m Systemic Diam: 1.70 cm  Sunit Tolia Electronically signed by Madonna Large Signature Date/Time: 02/27/2024/2:00:36 PM    Final          ______________________________________________________________________________________________       Laboratory Data: High Sensitivity Troponin:   Recent Labs  Lab 02/27/24 0347 02/27/24 0543  TROPONINIHS 88* 94*     Chemistry Recent Labs  Lab 02/27/24 0347  NA 134*  K 4.2  CL 103  CO2 18*  GLUCOSE 96  BUN 9  CREATININE 0.46  CALCIUM 8.9  GFRNONAA >60  ANIONGAP 13    No results for input(s): PROT, ALBUMIN, AST, ALT, ALKPHOS, BILITOT in the last 168 hours. Lipids No results for input(s): CHOL, TRIG, HDL, LABVLDL, LDLCALC, CHOLHDL in the last 168 hours.  Hematology Recent Labs  Lab 02/27/24 0347  WBC 6.5  RBC 4.83  HGB 9.2*  HCT 29.8*  MCV 61.7*  MCH 19.0*  MCHC 30.9  RDW 19.0*  PLT 258   Thyroid   Recent Labs  Lab 02/27/24 0349 02/27/24 0543  TSH <0.100*  --   FREET4  --  2.39*    BNP Recent Labs  Lab 02/27/24 0347  BNP 721.2*    DDimer  Recent Labs  Lab 02/27/24 0543  DDIMER 1.94*    Radiology/Studies:  DG CHEST PORT 1 VIEW Result Date: 02/27/2024 EXAM: 1 VIEW(S) XRAY OF THE CHEST 02/27/2024 11:26:00 AM COMPARISON: 05/11/2021 CLINICAL HISTORY: Dyspnea FINDINGS: LUNGS AND PLEURA: No focal pulmonary opacity. No pulmonary edema. No pleural effusion. No pneumothorax. HEART AND MEDIASTINUM: Mild cardiomegaly. No acute abnormality of the cardiac and mediastinal silhouettes. BONES AND SOFT TISSUES: No acute osseous abnormality. IMPRESSION: 1. No acute cardiopulmonary process. Electronically signed by: Lynwood Seip MD 02/27/2024 12:33 PM EST RP Workstation: [id number]     Assessment and Plan:  Chronic HFrEF  Moderate-severe MR  - Previous had EF 25-30% in 2014 in the  setting of cocaine use, uncontrolled hyperthyroidism.  EF improved to 60-65% in 2023 - Echocardiogram this admission showed EF 40-45% with mild LVH, normal RV systolic function, moderately elevated PA systolic pressure, moderate-severe mitral valve regurgitation - Patient euvolemic on exam.  Chest x-ray without acute cardiopulmonary process.  No indication for diuresis at this time  - suspect EF is reduced due to cocaine use, uncontrolled hyperthyroidism with tachycardia. Would prefer to avoid cath with recent cocaine use (yesterday evening)  - Agree with  carvedilol 6.25 mg BID  - Start losartan 25 mg daily  - Consider outpatient TEE to better evaluate  MR   Chest pain  Elevated troponin  - Patient complains of somewhat vague left-sided chest pain.  Has left upper quadrant pain that is worse with palpation.  Also has left chest pain that is worse when she smokes, drinks alcohol , or exerts herself. - High-sensitivity troponin 88> 94 - As above, given very recent cocaine use, would prefer to hold off on cardiac catheterization  Sinus tachycardia - Heart rate currently in the low 110s.  Suspect this is driven by recent cocaine use, uncontrolled hyperthyroidism - Starting carvedilol as above - D-dimer elevated-patient declined CTA chest PE.  Further workup per primary   Otherwise per primary  - Graves' disease, hyperthyroidism - Microcytic anemia - Polysubstance use disorder - STI testing requested   Risk Assessment/Risk Scores: For questions or updates, please contact [facility] Please consult [email or web address] for contact info under  Signed, Rollo FABIENE Louder, PA-C  02/27/2024 1:30 PM  Patient seen and examined, note reviewed with the signed Advanced Practice Provider. I personally reviewed laboratory data, imaging studies and relevant notes. I independently examined the patient and formulated the important aspects of the plan. I have personally discussed the plan with the  patient and/or family. Comments or changes to the note/plan are indicated below.   Patient seen and examined at her bedside.   Chronic heart failure with reduced ejection fraction  Moderate- Severe Moderate Regurgitation  Elevated troponin - but flat Atypical chest pain - not consistent ACS  Sinus tachycardia - suspect this is due current clinical state so will address underlying thyroid .  Graves disease  Polysubstance abuse   Clinically she does not appear to be in volume overloaded and I do not believe IV diuretic will be beneficial. Oral Lasix  20 mg daily.  It will be best to get a repeat echo in the outpatient setting especially after her acute illness - thankfully she does not have primary mitral regurgitation.   Her newly depressed ejection fraction may be multifactorial : substance abuse, vs untreated thyroid  disease. This may be similar to before that time will help get her back to improved EF on optimized medical management.   Trop are flat - atypical pain not consistent with ACS and thankfully no wall motion abnormalities.  Investing in optimizing her medication and advising adherence will be ideal. Certainly if clinical picture changes will reassess the need for an ischemic evaluation.   Continue with current dose of coreg, agree with starting losartan. Recommend Lasix  20 mg daily.  We will set up a follow up visit for close follow up in the outpatient setting .   Eldena Dede DO, MS [facility] Attending Cardiologist [facility]  [address] [place], [place] [address] [phone] Website: [email or web address]

## 2024-02-28 DIAGNOSIS — E785 Hyperlipidemia, unspecified: Secondary | ICD-10-CM | POA: Diagnosis present

## 2024-02-28 DIAGNOSIS — E440 Moderate protein-calorie malnutrition: Secondary | ICD-10-CM | POA: Diagnosis present

## 2024-02-28 DIAGNOSIS — Z888 Allergy status to other drugs, medicaments and biological substances status: Secondary | ICD-10-CM | POA: Diagnosis not present

## 2024-02-28 DIAGNOSIS — E042 Nontoxic multinodular goiter: Secondary | ICD-10-CM | POA: Diagnosis present

## 2024-02-28 DIAGNOSIS — I5083 High output heart failure: Secondary | ICD-10-CM | POA: Diagnosis present

## 2024-02-28 DIAGNOSIS — E890 Postprocedural hypothyroidism: Secondary | ICD-10-CM | POA: Diagnosis present

## 2024-02-28 DIAGNOSIS — D519 Vitamin B12 deficiency anemia, unspecified: Secondary | ICD-10-CM | POA: Diagnosis present

## 2024-02-28 DIAGNOSIS — F1490 Cocaine use, unspecified, uncomplicated: Secondary | ICD-10-CM | POA: Diagnosis not present

## 2024-02-28 DIAGNOSIS — I5020 Unspecified systolic (congestive) heart failure: Secondary | ICD-10-CM | POA: Diagnosis not present

## 2024-02-28 DIAGNOSIS — F1410 Cocaine abuse, uncomplicated: Secondary | ICD-10-CM | POA: Diagnosis present

## 2024-02-28 DIAGNOSIS — R0602 Shortness of breath: Secondary | ICD-10-CM | POA: Diagnosis present

## 2024-02-28 DIAGNOSIS — F17210 Nicotine dependence, cigarettes, uncomplicated: Secondary | ICD-10-CM | POA: Diagnosis present

## 2024-02-28 DIAGNOSIS — Z91040 Latex allergy status: Secondary | ICD-10-CM | POA: Diagnosis not present

## 2024-02-28 DIAGNOSIS — Z833 Family history of diabetes mellitus: Secondary | ICD-10-CM | POA: Diagnosis not present

## 2024-02-28 DIAGNOSIS — Z885 Allergy status to narcotic agent status: Secondary | ICD-10-CM | POA: Diagnosis not present

## 2024-02-28 DIAGNOSIS — D509 Iron deficiency anemia, unspecified: Secondary | ICD-10-CM | POA: Diagnosis present

## 2024-02-28 DIAGNOSIS — E0500 Thyrotoxicosis with diffuse goiter without thyrotoxic crisis or storm: Secondary | ICD-10-CM | POA: Diagnosis present

## 2024-02-28 DIAGNOSIS — R000 Tachycardia, unspecified: Secondary | ICD-10-CM | POA: Diagnosis not present

## 2024-02-28 DIAGNOSIS — Z803 Family history of malignant neoplasm of breast: Secondary | ICD-10-CM | POA: Diagnosis not present

## 2024-02-28 DIAGNOSIS — Z560 Unemployment, unspecified: Secondary | ICD-10-CM | POA: Diagnosis not present

## 2024-02-28 DIAGNOSIS — Z79899 Other long term (current) drug therapy: Secondary | ICD-10-CM | POA: Diagnosis not present

## 2024-02-28 DIAGNOSIS — I5042 Chronic combined systolic (congestive) and diastolic (congestive) heart failure: Secondary | ICD-10-CM | POA: Diagnosis present

## 2024-02-28 DIAGNOSIS — E871 Hypo-osmolality and hyponatremia: Secondary | ICD-10-CM | POA: Diagnosis present

## 2024-02-28 DIAGNOSIS — I340 Nonrheumatic mitral (valve) insufficiency: Secondary | ICD-10-CM | POA: Diagnosis present

## 2024-02-28 DIAGNOSIS — D72829 Elevated white blood cell count, unspecified: Secondary | ICD-10-CM | POA: Diagnosis present

## 2024-02-28 DIAGNOSIS — I110 Hypertensive heart disease with heart failure: Secondary | ICD-10-CM | POA: Diagnosis present

## 2024-02-28 DIAGNOSIS — E0590 Thyrotoxicosis, unspecified without thyrotoxic crisis or storm: Secondary | ICD-10-CM | POA: Diagnosis not present

## 2024-02-28 DIAGNOSIS — K761 Chronic passive congestion of liver: Secondary | ICD-10-CM | POA: Diagnosis present

## 2024-02-28 DIAGNOSIS — Z681 Body mass index (BMI) 19 or less, adult: Secondary | ICD-10-CM | POA: Diagnosis not present

## 2024-02-28 LAB — MAGNESIUM: Magnesium: 1.9 mg/dL (ref 1.7–2.4)

## 2024-02-28 LAB — TECHNOLOGIST SMEAR REVIEW: Plt Morphology: NORMAL

## 2024-02-28 LAB — VITAMIN B12: Vitamin B-12: <150 pg/mL — ABNORMAL LOW (ref 180–914)

## 2024-02-28 LAB — CBC WITH DIFFERENTIAL/PLATELET
Abs Immature Granulocytes: 0.01 K/uL (ref 0.00–0.07)
Basophils Absolute: 0 K/uL (ref 0.0–0.1)
Basophils Relative: 0 %
Eosinophils Absolute: 0.6 K/uL — ABNORMAL HIGH (ref 0.0–0.5)
Eosinophils Relative: 9 %
HCT: 27.5 % — ABNORMAL LOW (ref 36.0–46.0)
Hemoglobin: 8.8 g/dL — ABNORMAL LOW (ref 12.0–15.0)
Immature Granulocytes: 0 %
Lymphocytes Relative: 38 %
Lymphs Abs: 2.3 K/uL (ref 0.7–4.0)
MCH: 19.5 pg — ABNORMAL LOW (ref 26.0–34.0)
MCHC: 32 g/dL (ref 30.0–36.0)
MCV: 60.8 fL — ABNORMAL LOW (ref 80.0–100.0)
Monocytes Absolute: 0.3 K/uL (ref 0.1–1.0)
Monocytes Relative: 6 %
Neutro Abs: 2.8 K/uL (ref 1.7–7.7)
Neutrophils Relative %: 47 %
Platelets: 241 K/uL (ref 150–400)
RBC: 4.52 MIL/uL (ref 3.87–5.11)
RDW: 19 % — ABNORMAL HIGH (ref 11.5–15.5)
WBC: 6 K/uL (ref 4.0–10.5)
nRBC: 0 % (ref 0.0–0.2)

## 2024-02-28 LAB — COMPREHENSIVE METABOLIC PANEL WITH GFR
ALT: 21 U/L (ref 0–44)
AST: 31 U/L (ref 15–41)
Albumin: 2.9 g/dL — ABNORMAL LOW (ref 3.5–5.0)
Alkaline Phosphatase: 104 U/L (ref 38–126)
Anion gap: 11 (ref 5–15)
BUN: 12 mg/dL (ref 6–20)
CO2: 22 mmol/L (ref 22–32)
Calcium: 9.2 mg/dL (ref 8.9–10.3)
Chloride: 106 mmol/L (ref 98–111)
Creatinine, Ser: 0.52 mg/dL (ref 0.44–1.00)
GFR, Estimated: >60 mL/min (ref >60)
Glucose, Bld: 104 mg/dL — ABNORMAL HIGH (ref 70–99)
Potassium: 4.1 mmol/L (ref 3.5–5.1)
Sodium: 139 mmol/L (ref 135–145)
Total Bilirubin: 0.5 mg/dL (ref 0.0–1.2)
Total Protein: 6.7 g/dL (ref 6.5–8.1)

## 2024-02-28 LAB — RPR: RPR Ser Ql: NONREACTIVE

## 2024-02-28 LAB — HIV ANTIBODY (ROUTINE TESTING W REFLEX): HIV Screen 4th Generation wRfx: NONREACTIVE

## 2024-02-28 LAB — T3, FREE: T3, Free: 11.6 pg/mL — ABNORMAL HIGH (ref 2.0–4.4)

## 2024-02-28 LAB — TROPONIN I (HIGH SENSITIVITY): Troponin I (High Sensitivity): 92 ng/L — ABNORMAL HIGH (ref <18)

## 2024-02-28 MED ORDER — BENZONATATE 100 MG PO CAPS
100.0000 mg | ORAL_CAPSULE | Freq: Two times a day (BID) | ORAL | Status: DC | PRN
Start: 2024-02-28 — End: 2024-03-05
  Administered 2024-02-28 – 2024-03-03 (×2): 100 mg via ORAL
  Filled 2024-02-28 (×2): qty 1

## 2024-02-28 MED ORDER — PREDNISONE 20 MG PO TABS
40.0000 mg | ORAL_TABLET | Freq: Every day | ORAL | Status: AC
  Administered 2024-02-28 – 2024-03-03 (×5): 40 mg via ORAL
  Filled 2024-02-28 (×5): qty 2

## 2024-02-28 MED ORDER — SODIUM CHLORIDE 0.9 % IV SOLN
200.0000 mg | Freq: Once | INTRAVENOUS | Status: DC
  Filled 2024-02-28: qty 10

## 2024-02-28 MED ORDER — HYDROXYZINE HCL 10 MG PO TABS
10.0000 mg | ORAL_TABLET | Freq: Three times a day (TID) | ORAL | Status: DC | PRN
Start: 2024-02-28 — End: 2024-03-05
  Administered 2024-02-28 – 2024-03-05 (×6): 10 mg via ORAL
  Filled 2024-02-28 (×6): qty 1

## 2024-02-28 MED ORDER — TRIAMCINOLONE ACETONIDE 0.1 % EX CREA
1.0000 | TOPICAL_CREAM | Freq: Two times a day (BID) | CUTANEOUS | Status: DC
  Administered 2024-02-28 – 2024-03-02 (×6): 1 via TOPICAL
  Filled 2024-02-28: qty 15

## 2024-02-28 MED ORDER — CYANOCOBALAMIN 1000 MCG/ML IJ SOLN
100.0000 ug | Freq: Once | INTRAMUSCULAR | Status: DC
  Filled 2024-02-28: qty 0.1

## 2024-02-28 MED ORDER — CYANOCOBALAMIN 1000 MCG/ML IJ SOLN
1000.0000 ug | Freq: Once | INTRAMUSCULAR | Status: AC
  Administered 2024-02-28: 1000 ug via INTRAMUSCULAR
  Filled 2024-02-28: qty 1

## 2024-02-28 MED ORDER — HYDROXYZINE HCL 25 MG PO TABS: 25.0000 mg | ORAL_TABLET | Freq: Three times a day (TID) | ORAL | Status: DC | PRN

## 2024-02-28 NOTE — Evaluation (Signed)
 Occupational Therapy Evaluation Patient Details Name: Mary Frey MRN: [id number] DOB: 1975/09/11 Today's Date: 02/28/2024   History of Present Illness   Pt is a 48 y.o. female who presented 02/27/24 due to chest pain and SOB. 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 She was able to make bed, completed ADLS at the sink and ambulated in room level with no assist but was cued due to pacing self with HR and modification to tasks. Pt reported no SOB and no chest pain. Pt HR 112-139 in session. Post o2 98% and BP 127/93 (104). At this time Acute Occupational Therapy signing off. Thank you.      If plan is discharge home, recommend the following:         Functional Status Assessment   Patient has had a recent decline in their functional status and demonstrates the ability to make significant improvements in function in a reasonable and predictable amount of time.     Equipment Recommendations    (shower seat if still having high HR)     Recommendations for Other Services         Precautions/Restrictions   Precautions Precautions:  (watch HR) Recall of Precautions/Restrictions: Intact Precaution/Restrictions Comments: pt is aware of HR Restrictions Weight Bearing Restrictions Per Provider Order: No     Mobility Bed Mobility Overal bed mobility: Independent             General bed mobility comments: no assist and completly supine position    Transfers Overall transfer level: Independent Equipment used: None                      Balance Overall balance assessment: Independent                                         ADL either performed or assessed with clinical judgement   ADL Overall ADL's :  Modified independent                                       General ADL Comments: pt just needs cues on pacing self due to HR     Vision Baseline Vision/History: 0 No visual deficits Ability to See in Adequate Light: 0 Adequate Patient Visual Report: No change from baseline Vision Assessment?: No apparent visual deficits     Perception Perception: Within Functional Limits       Praxis Praxis: WFL       Pertinent Vitals/Pain Pain Assessment Pain Assessment: No/denies pain     Extremity/Trunk Assessment Upper Extremity Assessment Upper Extremity Assessment: Overall WFL for tasks assessed   Lower Extremity Assessment Lower Extremity Assessment: Defer to PT evaluation   Cervical / Trunk Assessment Cervical / Trunk Assessment: Normal   Communication Communication Communication: No apparent difficulties   Cognition Arousal: Alert Behavior During Therapy: WFL for tasks assessed/performed Cognition: No apparent impairments                               Following commands: Intact  Cueing  General Comments   Cueing Techniques: Verbal cues      Exercises     Shoulder Instructions      Home Living Family/patient expects to be discharged to:: Private residence Living Arrangements: Alone   Type of Home: House Home Access: Stairs to enter Secretary/administrator of Steps: 1 flight Entrance Stairs-Rails: Right Home Layout: One level     Bathroom Shower/Tub: It Trainer: Standard     Home Equipment: None          Prior Functioning/Environment Prior Level of Function : Independent/Modified Independent             Mobility Comments: indep ADLs Comments: indep    OT Problem List: Cardiopulmonary status limiting activity   OT Treatment/Interventions:        OT Goals(Current goals can be found in the care plan section)   Acute Rehab OT Goals Patient Stated Goal: to speak to  nursing  for meds OT Goal Formulation: With patient Time For Goal Achievement: 03/13/24 Potential to Achieve Goals: Good   OT Frequency:       Co-evaluation              AM-PAC OT 6 Clicks Daily Activity     Outcome Measure Help from another person eating meals?: None Help from another person taking care of personal grooming?: None Help from another person toileting, which includes using toliet, bedpan, or urinal?: None Help from another person bathing (including washing, rinsing, drying)?: None Help from another person to put on and taking off regular upper body clothing?: None Help from another person to put on and taking off regular lower body clothing?: None 6 Click Score: 24   End of Session Nurse Communication: Mobility status  Activity Tolerance: Other (comment) (limited activity due to HR) Patient left: in bed;with call bell/phone within reach  OT Visit Diagnosis: Muscle weakness (generalized) (M62.81)                Time: 1020-1036 OT Time Calculation (min): 16 min Charges:  OT General Charges $OT Visit: 1 Visit OT Evaluation $OT Eval Low Complexity: 1 Low  Mary Frey OTR/L  Acute Rehab Services  [phone] office number   Mary Berber 02/28/2024, 10:49 AM

## 2024-02-28 NOTE — Progress Notes (Addendum)
 HD#0 SUBJECTIVE:  Patient Summary: Mary Frey is a 48 y.o. 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  Said she was feeling well with no chest pain and SOB.  Reports denying lab draw/PIV last night as her hand was hurting. OBJECTIVE:  Vital Signs: Vitals:   02/27/24 1956 02/27/24 2318 02/28/24 0700 02/28/24 0853  BP: (!) 144/99 116/81 (!) 137/95 (!) 137/95  Pulse: (!) 121 98 (!) 53 (!) 116  Resp: 20 16 19    Temp:  98.5 F (36.9 C) 98.7 F (37.1 C)   TempSrc:  Oral Oral   SpO2: 100% 100% 92%   Weight:      Height:       Supplemental O2: Room Air SpO2: 92 %  Filed Weights   02/27/24 0332 02/27/24 1816  Weight: 53.5 kg 49.7 kg     Intake/Output Summary (Last 24 hours) at 02/28/2024 9057 Last data filed at 02/28/2024 9096 Gross per 24 hour  Intake 280 ml  Output --  Net 280 ml   Net IO Since Admission: 280 mL [02/28/24 0942]  Physical Exam: Physical Exam Neck:     Comments: Mild thyromegaly with nodularity  JVP noted  Cardiovascular:     Rate and Rhythm: Tachycardia present.  Pulmonary:     Effort: Pulmonary effort is normal. No respiratory distress.     Breath sounds: Normal breath sounds.  Skin:    General: Skin is warm.  Neurological:     Mental Status: She is alert.  Psychiatric:        Mood and Affect: Mood normal.     Patient Lines/Drains/Airways Status     Active Line/Drains/Airways     Name Placement date Placement time Site Days   Peripheral IV 02/27/24 20 G 1 Anterior;Left;Proximal Forearm 02/27/24  0545  Forearm  1            Pertinent labs and imaging:       Latest Ref Rng & Units 02/28/2024    6:58 AM 02/27/2024    3:47 AM 09/18/2022    6:33 AM  CBC  WBC 4.0 - 10.5 K/uL 6.0  6.5  6.3   Hemoglobin 12.0 - 15.0 g/dL 8.8  9.2  9.0   Hematocrit 36.0 - 46.0 % 27.5  29.8  29.9   Platelets 150 - 400 K/uL 241  258  292        Latest Ref Rng & Units 02/28/2024    6:52 AM 02/27/2024    5:43 AM 02/27/2024    3:47 AM  CMP  Glucose 70 - 99 mg/dL 895   96   BUN 6 - 20 mg/dL 12   9   Creatinine 9.55 - 1.00 mg/dL 9.47   9.53   Sodium 864 - 145 mmol/L 139   134   Potassium 3.5 - 5.1 mmol/L 4.1   4.2   Chloride 98 - 111 mmol/L 106   103   CO2 22 - 32 mmol/L 22   18   Calcium 8.9 - 10.3 mg/dL 9.2   8.9   Total Protein 6.5 - 8.1 g/dL 6.7  7.6    Total  Bilirubin 0.0 - 1.2 mg/dL 0.5  0.3    Alkaline Phos 38 - 126 U/L 104  118    AST 15 - 41 U/L 31  32    ALT 0 - 44 U/L 21  23      ECHOCARDIOGRAM COMPLETE Result Date: 02/27/2024    ECHOCARDIOGRAM REPORT   Patient Name:   Mary Frey Date of Exam: 02/27/2024 Medical Rec #:  [id number]        Height:       65.0 in Accession #:    [id number]       Weight:       118.0 lb Date of Birth:  1975-11-15         BSA:          1.581 m Patient Age:    48 years         BP:           117/100 mmHg Patient Gender: F                HR:           118 bpm. Exam Location:  Inpatient Procedure: 2D Echo, Cardiac Doppler and Color Doppler (Both Spectral and Color            Flow Doppler were utilized during procedure). Indications:    Dyspnea R06.00  History:        Patient has prior history of Echocardiogram examinations, most                 recent 05/12/2021. Risk Factors:Hypertension.  Sonographer:    Tinnie Gosling RDCS Referring Phys: [phone] TRAVIS J YOUNG IMPRESSIONS  1. Left ventricular ejection fraction, by estimation, is 40 to 45%. The left ventricle has mildly decreased function. The left ventricle demonstrates global hypokinesis. There is mild left ventricular hypertrophy.  2. Right ventricular systolic function is normal.  The right ventricular size is normal. There is moderately elevated pulmonary artery systolic pressure. The estimated right ventricular systolic pressure is 46.7 mmHg.  3. The mitral valve is grossly normal. Moderate to severe mitral valve regurgitation. No evidence of mitral stenosis.  4. The aortic valve is normal in structure. Aortic valve regurgitation is not visualized. No aortic stenosis is present.  5. The inferior vena cava is normal in size with <50% respiratory variability, suggesting right atrial pressure of 8 mmHg. Comparison(s): A prior study was performed on 05/12/2021. LVEF 60 to 65%, normal diastolic function, mild MR, small gradient across the LVOT/aortic valve without obvious subaortic web and valve appears to open, some chordal SAM and hyperdynamic LVEF which may be contributory. See report for additional details. Conclusion(s)/Recommendation(s): Findings concerning for Mitral regurgitation, would recommend Transesophageal Echocardiogram for clarification. FINDINGS  Left Ventricle: Left ventricular ejection fraction, by estimation, is 40 to 45%. The left ventricle has mildly decreased function. The left ventricle demonstrates global hypokinesis. The left ventricular internal cavity size was normal in size. There is  mild left ventricular hypertrophy. Right Ventricle: The right ventricular size is normal. No increase in right ventricular wall thickness. Right ventricular systolic function is normal. There is moderately elevated pulmonary artery systolic pressure. The tricuspid regurgitant velocity is 3.11 m/s, and with an assumed right atrial pressure of 8 mmHg, the estimated right ventricular systolic pressure is 46.7 mmHg. Left Atrium: Left atrial size was normal in size. Right Atrium: Right atrial size was normal in size. Pericardium: There is no evidence of pericardial effusion. Mitral Valve: The mitral valve is  grossly normal. Moderate to severe mitral valve regurgitation. No evidence of mitral  valve stenosis. Tricuspid Valve: The tricuspid valve is normal in structure. Tricuspid valve regurgitation is mild . No evidence of tricuspid stenosis. Aortic Valve: The aortic valve is normal in structure. Aortic valve regurgitation is not visualized. No aortic stenosis is present. Pulmonic Valve: The pulmonic valve was grossly normal. Pulmonic valve regurgitation is not visualized. No evidence of pulmonic stenosis. Aorta: The aortic root and ascending aorta are structurally normal, with no evidence of dilitation. Venous: The inferior vena cava is normal in size with less than 50% respiratory variability, suggesting right atrial pressure of 8 mmHg. IAS/Shunts: The atrial septum is grossly normal.  LEFT VENTRICLE PLAX 2D LVIDd:         4.70 cm LVIDs:         3.70 cm LV PW:         1.10 cm LV IVS:        1.10 cm LVOT diam:     1.70 cm LV SV:         37 LV SV Index:   24 LVOT Area:     2.27 cm LV IVRT:       106 msec  RIGHT VENTRICLE         IVC TAPSE (M-mode): 2.2 cm  IVC diam: 1.40 cm LEFT ATRIUM           Index        RIGHT ATRIUM           Index LA diam:      4.10 cm 2.59 cm/m   RA Area:     11.90 cm LA Vol (A4C): 52.3 ml 33.09 ml/m  RA Volume:   25.10 ml  15.88 ml/m  AORTIC VALVE LVOT Vmax:   118.00 cm/s LVOT Vmean:  77.900 cm/s LVOT VTI:    0.165 m  AORTA Ao Root diam: 2.40 cm Ao Asc diam:  3.00 cm TRICUSPID VALVE TR Peak grad:   38.7 mmHg TR Vmax:        311.00 cm/s  SHUNTS Systemic VTI:  0.16 m Systemic Diam: 1.70 cm Sunit Tolia Electronically signed by Madonna Large Signature Date/Time: 02/27/2024/2:00:36 PM    Final    DG CHEST PORT 1 VIEW Result Date: 02/27/2024 EXAM: 1 VIEW(S) XRAY OF THE CHEST 02/27/2024 11:26:00 AM COMPARISON: 05/11/2021 CLINICAL HISTORY: Dyspnea FINDINGS: LUNGS AND PLEURA: No focal pulmonary opacity. No pulmonary edema. No pleural effusion. No pneumothorax. HEART AND MEDIASTINUM: Mild cardiomegaly. No acute abnormality of the cardiac and mediastinal silhouettes. BONES AND SOFT  TISSUES: No acute osseous abnormality. IMPRESSION: 1. No acute cardiopulmonary process. Electronically signed by: Lynwood Seip MD 02/27/2024 12:33 PM EST RP Workstation: [id number]    ASSESSMENT/PLAN:  Assessment: 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   Hyperthyroidism   Shortness of breath   Sinus tachycardia   Plan: HFrEF Echo showed worsened EF of 40-45% from previous findings with mild LVH, normal RV systolic function, moderate to severe MVR.  This is likely due to uncontrolled hyperthyroidism with tachycardia along with cocaine use.  Will continue carvedilol 6.25 mg daily.  Avoiding heart cath with recent cocaine use (02/26/24 evening, per charting).  Spoke to patient who agreed to be on telemetry. Spoke to Dr. Kriste from cardiology who recommended they will f/u with her her OP to do a TEE  first and then perform a heart cath if indicated. Brought up the possibility of pt undergoing thyroidectomy to which cardiology said they will do a pre-op evaluation for her when they see her OP.   - Carvedilol 6.25 mg twice daily - Continue losartan 25 mg daily - Monitor on telemetry  Graves disease, dx 2014 Hyperthyroidism Sustained tachycardia likely due to no medication use.  Patient moved from [place] and has been in [place] this past year. She has seen and endocrinologist in [place], however, hasn't followed up with them. Discussed options of thyroid  surgery and radio-ablative therapy and she wanted to get surgery, opposed to radioiodine therapy. Spoke to Dr. Roark from ENT who recommended pt be seen by an endocrinologist first to bring her thyroid  levels under control before surgery as she is at a greatest risk for being affected by thyroid  storm at present. An ENT physician will be seeing the pt earlier next week.   -ENT to see pt around 11/10 -11/11 -Will reach out to endocrinology to help  control thyroid  levels before she can undergo thyroidectomy  -Carvedilol as above  -Will monitor for symptoms of thyroid  storm including hyperthermia, arrhythmias, encephalopathy  Tachycardia Likely due to hyperthyroidism, cocaine use.  Low suspicion of PE at this time as TTE did not show any right heart strain.  Will hold on CTA at this time, pt previously declined.  Polysubstance use disorder Discussed with patient about her cocaine use.  Agrees that taking it is dangerous and that she was only doing it to resolve discomfort from her high thyroid  levels.  Was open to quitting cocaine. Wil reach out to [facility] for resources. Pt has been in [place] for the past year and will not be moving back to [place] but she is currently still on [place] medicare. Will also reach out to SW to help pt set up [place] medicare   -TOC consult placed for substance use disorder resources -SW to help with switching state medicare insurances   STI testing -G/C and HIV results pending  Microcytic anemia Hgb stable.  Iron studies consistent with deficiency. Received IV iron x1 dose last evening, scheduled again today. Nutritional deficiencies also suspected, B12 very low.  -IV iron x2 -B12 supplementation  Best Practice: Diet: Cardiac diet IVF: none VTE: enoxaparin  (LOVENOX ) injection 40 mg Start: 02/27/24 1115 SCDs Start: 02/27/24 1058 Code: Full  Disposition planning:  DISPO: Discharge pending clinical improvement   Signature:  Rebecka Edgardo [facility]  9:42 AM, 02/28/2024  On Call pager [phone]

## 2024-02-28 NOTE — Progress Notes (Signed)
 PT Cancellation Note  Patient Details Name: EDDIS PINGLETON MRN: [id number] DOB: 28-Dec-1975   Cancelled Treatment:    Reason Eval/Treat Not Completed: PT screened, no needs identified, will sign off   Stephane JULIANNA Bevel 02/28/2024, 11:41 AM Demetrus Pavao M,PT Acute Rehab Services [phone]

## 2024-02-28 NOTE — Progress Notes (Signed)
   Ms. Mary Frey is a 48 year old female with a history of Graves' disease, currently off medication due to prior severe transaminitis from methimazole , and newly diagnosed HFrEF (EF 40-45% on 02/27/24; previously 60-65% on 04/2021 echocardiogram). She presented with chest pain, likely in the setting of substance use, and was started on carvedilol. On admission, her TSH was undetectable with elevated T3 and T4 levels, and diffuse thyroid  nodules were noted on exam--findings consistent with severe Graves' disease. Given her new decline in ejection fraction, I suspect Graves'-induced heart failure.  ENT was consulted and recommended achieving better thyroid  hormone control prior to any surgical intervention. The patient prefers to avoid radioactive iodine therapy due to a negative family experience with radiation exposure. Given her history of significant transaminitis with methimazole , PTU is also contraindicated.  Due to limited treatment options, I contacted the [facility] physician access line and discussed the case with Dr. Therisa Feil (Endocrinology). She recommended initiating a short course of steroid therapy (prednisone  40 mg daily for 3-5 days) to help normalize thyroid  hormone levels in preparation for surgery, noting that improvement should be seen by the third day. If there is inadequate response, plasmapheresis may be considered.  Plan:  - Discuss treatment approach and steroid therapy with the patient  - Initiate prednisone  40 mg daily if patient agrees  - Monitor thyroid  hormone levels over the next several days  - Continue carvedilol for HFrEF management  - Coordinate with ENT and Endocrinology for ongoing management and surgical planning  Signature: Drue Grow , MD Internal Medicine Resident, PGY-2 [facility]  Pager: [phone]- [phone] 12:13 PM, 02/28/2024

## 2024-02-28 NOTE — Progress Notes (Signed)
 VAT consulted for PIV insertion.  Arrived to room. Pt refused PIV insertion at this time. Stated,  I will get it later.   New consult to be placed with pt is ready.  Consult complete.

## 2024-02-28 NOTE — Plan of Care (Signed)
  Problem: Education: Goal: Knowledge of General Education information will improve Description Including pain rating scale, medication(s)/side effects and non-pharmacologic comfort measures Outcome: Progressing   Problem: Health Behavior/Discharge Planning: Goal: Ability to manage health-related needs will improve Outcome: Progressing   Problem: Clinical Measurements: Goal: Will remain free from infection Outcome: Progressing   Problem: Clinical Measurements: Goal: Diagnostic test results will improve Outcome: Progressing   Problem: Skin Integrity: Goal: Risk for impaired skin integrity will decrease Outcome: Progressing   Problem: Safety: Goal: Ability to remain free from injury will improve Outcome: Progressing

## 2024-02-28 NOTE — Progress Notes (Signed)
 02/28/2024 6:32 AM Pt refusing labs, tele monitoring, vital signs, weights, IV access.  Paged on call.   Dasie Lamarr BROCKS

## 2024-02-28 NOTE — Progress Notes (Signed)
 IMTS Cross Cover Note  Received page regarding patient refusing telemetry  On interview with the patient, she states that the telemetry wires make her anxious as they will alarm when her heart rate goes high with ambulation. She also notes a cough that has worsened since last night. She states that she has not noticed any effects from the Prednisone , which she only received a dose of 2 hours prior to my arrival in her room. She denies congestion, fevers, chills, headaches, and shortness of breath.   Exam: Vitals:   02/28/24 1634 02/28/24 1734  BP: (!) 133/92 130/88  Pulse: (!) 113 (!) 111  Resp: 19   Temp: 98.6 F (37 C)   SpO2: 98%     General: Anxious Cardio: Tachycardic Pulm: CTAB, No increased work of breathing   Likely that her anxiety is being caused by her hyperthyroidism, she has had improvement with hydroxizine in the past so we will trial this medication.  Patient advised to continue wearing telemetry, counseled on the indication for her given her new onset decreased EF on Echo, along with concern for tachycardia. Agreeable to telemetry overnight.  Regarding her cough, it is possible that this is caused by her heart failure, but the differential also includes post-nasal drip, viral or bacterial infections. She has a paucity of infectious signs and a negative chest x-ray. Do not think further testing for COVID is required. She is euvolemic on exam so will not trial diuresis. Will trial symptomatic treatment with Tessalon Perles.  Also counseled regarding limited treatment options due to her prior DILT with methimazole . She believes that some of her liver injury at the time may have been related to her prior substance use. She expressed a desire to trial PTU, as she has not tried this medication, but she was informed that she would not be a candidate for this medication given cross-reactivity.  Plan: - Hydroxizine 10mg  TID PRN - Benzonatate Perles for cough - Consider  increasing dose of Coreg in the morning for tachycardia, HFrEF, and potential hyperthyroidism symptom control - Continue Telemetry   Mary Frey Internal Medicine Resident PGY-1 Please contact the on-call pager after 5 pm and on weekends at [phone]

## 2024-02-29 LAB — CBC
HCT: 31.9 % — ABNORMAL LOW (ref 36.0–46.0)
Hemoglobin: 10 g/dL — ABNORMAL LOW (ref 12.0–15.0)
MCH: 19.2 pg — ABNORMAL LOW (ref 26.0–34.0)
MCHC: 31.3 g/dL (ref 30.0–36.0)
MCV: 61.1 fL — ABNORMAL LOW (ref 80.0–100.0)
Platelets: 305 K/uL (ref 150–400)
RBC: 5.22 MIL/uL — ABNORMAL HIGH (ref 3.87–5.11)
RDW: 19.7 % — ABNORMAL HIGH (ref 11.5–15.5)
WBC: 7.8 K/uL (ref 4.0–10.5)
nRBC: 0.4 % — ABNORMAL HIGH (ref 0.0–0.2)

## 2024-02-29 LAB — BASIC METABOLIC PANEL WITH GFR
Anion gap: 12 (ref 5–15)
BUN: 9 mg/dL (ref 6–20)
CO2: 19 mmol/L — ABNORMAL LOW (ref 22–32)
Calcium: 9.3 mg/dL (ref 8.9–10.3)
Chloride: 106 mmol/L (ref 98–111)
Creatinine, Ser: 0.53 mg/dL (ref 0.44–1.00)
GFR, Estimated: >60 mL/min (ref >60)
Glucose, Bld: 99 mg/dL (ref 70–99)
Potassium: 4.4 mmol/L (ref 3.5–5.1)
Sodium: 137 mmol/L (ref 135–145)

## 2024-02-29 MED ORDER — CARVEDILOL 12.5 MG PO TABS
12.5000 mg | ORAL_TABLET | Freq: Two times a day (BID) | ORAL | Status: DC
  Administered 2024-02-29 – 2024-03-05 (×11): 12.5 mg via ORAL
  Filled 2024-02-29 (×13): qty 1

## 2024-02-29 MED ORDER — SODIUM CHLORIDE 0.9 % IV SOLN
200.0000 mg | Freq: Once | INTRAVENOUS | Status: AC
  Administered 2024-02-29: 200 mg via INTRAVENOUS
  Filled 2024-02-29: qty 10

## 2024-02-29 MED ORDER — ACETAMINOPHEN 650 MG RE SUPP
650.0000 mg | Freq: Four times a day (QID) | RECTAL | Status: DC
  Filled 2024-02-29: qty 1

## 2024-02-29 MED ORDER — VITAMIN B-12 1000 MCG PO TABS
1000.0000 ug | ORAL_TABLET | Freq: Every day | ORAL | Status: DC
  Administered 2024-02-29 – 2024-03-05 (×6): 1000 ug via ORAL
  Filled 2024-02-29 (×6): qty 1

## 2024-02-29 MED ORDER — LIDOCAINE 5 % EX PTCH
1.0000 | MEDICATED_PATCH | CUTANEOUS | Status: DC
Start: 2024-02-29 — End: 2024-02-29

## 2024-02-29 MED ORDER — ACETAMINOPHEN 325 MG PO TABS
650.0000 mg | ORAL_TABLET | Freq: Four times a day (QID) | ORAL | Status: DC
  Administered 2024-02-29 – 2024-03-05 (×17): 650 mg via ORAL
  Filled 2024-02-29 (×19): qty 2

## 2024-02-29 MED ORDER — DICLOFENAC SODIUM 1 % EX GEL
4.0000 g | Freq: Four times a day (QID) | CUTANEOUS | Status: DC
  Administered 2024-02-29 – 2024-03-05 (×15): 4 g via TOPICAL
  Filled 2024-02-29: qty 100

## 2024-02-29 NOTE — Progress Notes (Signed)
 Patient refused morning labs. Informed Dr. Napoleon by secure chat. Will pass on to day shift.

## 2024-02-29 NOTE — Progress Notes (Signed)
 Talked with Dr. Napoleon after he spoke with the patient. The doctor said patient agreed to be on telemetry and to give the patient Tessalon and Atarax .    I went to start the patient's iron sucrose IV bag only to find out the patient had taken her IV out earlier during the day without anyone being aware of it. I gave the patient her Tessalon and Atarax  medications.    Patient then requested snacks and said she was going to take off the telemetry in a bit as it was her right. I explained we needed to get her another IV but patient refused. Patient said maybe tomorrow she would get an IV and take the iron sucrose.    I paged Dr. Napoleon and let him know the situation. CCMD put patient on stand by when she took her telemetry leads off. The doctor explained to the patient the risks of not being of telemetry and I explained that without an IV it would make it slower giving her medication in an emergency. Will pass on the information  to day shift.

## 2024-02-29 NOTE — Progress Notes (Signed)
 After educating pt for the second time on having the tele monitor on and taking her medications this afternoon, pt agreed to tele monitoring and taking her medications but LOVENOX  shot and some other medications. MD notified.

## 2024-02-29 NOTE — Progress Notes (Addendum)
 After a little over an hour of putting the heart monitor, pt took the heart monitor out and has refused to put it on. MD aware.

## 2024-02-29 NOTE — Progress Notes (Signed)
 Pt refused tele monitor and her medications this morning.

## 2024-02-29 NOTE — Progress Notes (Addendum)
 HD#1 SUBJECTIVE:  Patient Summary:  Mary Frey. [place] is a 48 year old female with a past medical history of Graves' disease/hyperthyroidism (currently not on therapy due to methimazole -induced liver injury), hypertension, hyperlipidemia, and polysubstance use disorder. She presented with a two-week history of chest pain and progressive shortness of breath and was admitted for newly diagnosed heart failure with reduced ejection fraction and uncontrolled hyperthyroidism. She is currently undergoing a steroid trial in preparation for a planned thyroidectomy.  Overnight Events: Patient was anxious and the night team saw her at bedside. She was given hydroxyzine  which seemed to help with her anxiety.   Interim History: Mary Frey was assessed at the bedside this morning. She denied any chest pain, palpitations, or shortness of breath. She was counseled on the importance of adhering to her care plan.   OBJECTIVE:  Vital Signs: Vitals:   02/28/24 1213 02/28/24 1634 02/28/24 1734 02/29/24 0607  BP: (!) 127/95 (!) 133/92 130/88 117/81  Pulse: (!) 112 (!) 113 (!) 111 (!) 105  Resp: 19 19  18   Temp: 98.1 F (36.7 C) 98.6 F (37 C)  97.7 F (36.5 C)  TempSrc: Oral Oral  Oral  SpO2: 98% 98%  100%  Weight:    49.5 kg  Height:       Supplemental O2: Room Air SpO2: 100 %  Filed Weights   02/27/24 0332 02/27/24 1816 02/29/24 0607  Weight: 53.5 kg 49.7 kg 49.5 kg     Intake/Output Summary (Last 24 hours) at 02/29/2024 1150 Last data filed at 02/29/2024 9392 Gross per 24 hour  Intake 480 ml  Output --  Net 480 ml   Net IO Since Admission: 760 mL [02/29/24 1150]  Physical Exam: Constitutional:Well appearing Thin female ,laying in bed . In no acute distress. HENT: Normocephalic, atraumatic,  Eyes: Sclera non-icteric, PERRL, EOM intact Neck:normal atraumatic,,Multinodular thyroid   Cardio: Tachycardia   Pulm:Normal work of breathing on room air. Abdomen: Soft, non-tender, non-distended,  positive bowel sounds. FDX:Wzhjupcz for extremity edema. Skin:Warm and dry. Neuro:Alert and oriented x3.  Psych:Pleasant mood and affect.    ASSESSMENT/PLAN:  Assessment: 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   Hyperthyroidism   Shortness of breath   Sinus tachycardia   HFrEF (heart failure with reduced ejection fraction) (HCC)   Plan: # Graves' disease #Hyperthyroidism #Graves' induced high output heart failure The patient has a history of Graves' disease and methimazole  was discontinued due to history of  drug-induced liver injury. She asked about trying propylthiouracil but I advised against it for similar reasons. After further discussion, the patient agreed to consider thyroidectomy, though this cannot be performed until her thyroid  hormone levels are within normal range. Steroid trial for 3 to 5 days, with thyroid  hormone monitoring to assess improvement. The patient is open to trying steroids and started on prednisone  40 mg daily yesterday. Regarding her heart failure, she is currently euvolemic, and there is no indication for further diuresis at this time. I have instructed her to continue monitoring her volume status, as steroid initiation may cause volume retention. -  Prednisone  40 mg daily, carvedilol 12.5 mg bid -  Losartan 25 mg daily  - Continue monitoring volume status as the patient is currently euvolemic.  - No further diuresis required at this time.  - Monitor for potential volume retention due to steroid therapy; adjust diuretics as necessary.  -  Recheck thyroid  function after 3 to 5 days of prednisone  therapy.  - Follow-up with ENT to reassess thyroidectomy candidacy once hormone levels stabilize.  - Continue close monitoring of heart failure symptoms and adjust management as needed.  # Sinus tachycardia Mary Frey has been tachycardic since  admission in the 110s.  She has reported intermittent chest discomfort.  Suspect this chronic tachycardia is likely due to her Graves' disease for which she is currently on no regimen due to history of drug-induced liver injury with methimazole .  She used to take atenolol  for symptom control but has been initiated on carvedilol during hospitalization in the setting of recent cocaine use.  I think is reasonable to increase carvedilol dose if she continues to be tachycardic with symptoms. - Increase Coreg to 12.5 BID   # Polysubstance use disorder - Continuing substance use counseling  #Iron deficiency anemia #Vitamin B12 deficiency Hemoglobin at 9.0 today with MCV of 58.6 this could be contributing to her intermittent mild shortness of breath well.  Received IV iron x1, refused dose #2. Vitamin B12 levels came back < 150.  Repleted with 1000 mcg vitamin B12 yesterday. Continue oral supplementation.  - Trend CBC - Discuss oral iron supplementation with the patient  Best Practice: Diet: Cardiac diet IVF: Fluids: None, Rate: None VTE: enoxaparin  (LOVENOX ) injection 40 mg Start: 02/27/24 1115 SCDs Start: 02/27/24 1058 Code: Full AB: None  Therapy Recs: None, DME: none Family Contact: none , unable to be notified. DISPO: Anticipated discharge in 3 days to Home pending continued medical workup.  Signature: Drue Grow , MD Internal Medicine Resident, PGY-2 [facility]  Pager: [phone]- [phone] 11:50 AM, 02/29/2024

## 2024-02-29 NOTE — Progress Notes (Signed)
 Patient refusing to wear telemetry stickers and wires. Pt demanded to talk to the doctor. I paged the doctor on call Dr. Napoleon and relayed the information to him. The doctor was kind enough to say he would come up and speak with the patient.

## 2024-03-01 DIAGNOSIS — R000 Tachycardia, unspecified: Secondary | ICD-10-CM

## 2024-03-01 DIAGNOSIS — I5020 Unspecified systolic (congestive) heart failure: Secondary | ICD-10-CM | POA: Diagnosis not present

## 2024-03-01 DIAGNOSIS — F1490 Cocaine use, unspecified, uncomplicated: Secondary | ICD-10-CM

## 2024-03-01 DIAGNOSIS — E0500 Thyrotoxicosis with diffuse goiter without thyrotoxic crisis or storm: Secondary | ICD-10-CM

## 2024-03-01 DIAGNOSIS — Z7952 Long term (current) use of systemic steroids: Secondary | ICD-10-CM

## 2024-03-01 LAB — GC/CHLAMYDIA PROBE AMP (~~LOC~~) NOT AT ARMC
Chlamydia: NEGATIVE
Comment: NEGATIVE
Comment: NEGATIVE
Comment: NORMAL
Neisseria Gonorrhea: NEGATIVE
Trichomonas: NEGATIVE

## 2024-03-01 LAB — T3: T3, Total: 272 ng/dL — ABNORMAL HIGH (ref 71–180)

## 2024-03-01 LAB — T4, FREE: Free T4: 2.14 ng/dL — ABNORMAL HIGH (ref 0.61–1.12)

## 2024-03-01 LAB — TSH: TSH: <0.1 u[IU]/mL — ABNORMAL LOW (ref 0.350–4.500)

## 2024-03-01 MED ORDER — FERROUS SULFATE 325 (65 FE) MG PO TABS
325.0000 mg | ORAL_TABLET | Freq: Every day | ORAL | Status: DC
  Administered 2024-03-02 – 2024-03-05 (×4): 325 mg via ORAL
  Filled 2024-03-01 (×4): qty 1

## 2024-03-01 MED ORDER — FUROSEMIDE 20 MG PO TABS
20.0000 mg | ORAL_TABLET | Freq: Once | ORAL | Status: AC
  Administered 2024-03-01: 20 mg via ORAL
  Filled 2024-03-01: qty 1

## 2024-03-01 MED ORDER — FERROUS SULFATE 325 (65 FE) MG PO TABS
325.0000 mg | ORAL_TABLET | ORAL | Status: DC
  Administered 2024-03-01: 325 mg via ORAL
  Filled 2024-03-01: qty 1

## 2024-03-01 MED ORDER — RIVAROXABAN 10 MG PO TABS
10.0000 mg | ORAL_TABLET | Freq: Every day | ORAL | Status: DC
  Administered 2024-03-01 – 2024-03-05 (×5): 10 mg via ORAL
  Filled 2024-03-01 (×5): qty 1

## 2024-03-01 NOTE — Plan of Care (Signed)

## 2024-03-01 NOTE — Progress Notes (Signed)
 HD#2 SUBJECTIVE:  Patient Summary:Mary Frey is a 48 y.o. 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 She is currently undergoing a steroid trial in preparation for a planned thyroidectomy.   Overnight Events:  None   Interim History:  Saw pt at bedside. Denies any CP. Has some SOB when she walks. Had abdominal pain last night that resolved. Endorses palpitations some times.  OBJECTIVE:  Vital Signs: Vitals:   03/01/24 0011 03/01/24 0412 03/01/24 0836 03/01/24 1206  BP: 130/83 (!) 130/94  116/84  Pulse: (!) 110 100  98  Resp: 20 (!) 21 19 20   Temp: 97.6 F (36.4 C) 97.9 F (36.6 C) 98.3 F (36.8 C) (!) 97.5 F (36.4 C)  TempSrc: Oral Oral Oral Oral  SpO2: 100% 100%  100%  Weight:  50.3 kg    Height:       Supplemental O2: Room Air SpO2: 100 %  Filed Weights   02/27/24 1816 02/29/24 0607 03/01/24 0412  Weight: 49.7 kg 49.5 kg 50.3 kg     Intake/Output Summary (Last 24 hours) at 03/01/2024 1431 Last data filed at 03/01/2024 0416 Gross per 24 hour  Intake 347 ml  Output --  Net 347 ml   Net IO Since Admission: 1,107 mL [03/01/24 1431]  Physical Exam: Physical Exam HENT:     Head: Normocephalic.  Cardiovascular:     Rate and Rhythm: Tachycardia present.  Pulmonary:     Breath sounds: Normal breath sounds.  Musculoskeletal:     Right lower leg: No edema.     Left lower leg: No edema.  Skin:    General: Skin is warm.  Neurological:     Mental Status: She is alert.     Patient Lines/Drains/Airways Status     Active Line/Drains/Airways     Name Placement date Placement time Site Days   Peripheral IV 02/29/24 22 G 1.75 Anterior;Left Forearm 02/29/24  1840   Forearm  1            Pertinent labs and imaging:      Latest Ref Rng & Units 02/29/2024    8:06 AM 02/28/2024    6:58 AM 02/27/2024    3:47 AM  CBC  WBC 4.0 - 10.5 K/uL 7.8  6.0  6.5   Hemoglobin 12.0 - 15.0 g/dL 89.9  8.8  9.2   Hematocrit 36.0 - 46.0 % 31.9  27.5  29.8   Platelets 150 - 400 K/uL 305  241  258        Latest Ref Rng & Units 02/29/2024    8:06 AM 02/28/2024    6:52 AM 02/27/2024    5:43 AM  CMP  Glucose 70 - 99 mg/dL 99  895    BUN 6 - 20 mg/dL 9  12    Creatinine 9.55 - 1.00 mg/dL 9.46  9.47    Sodium 864 - 145 mmol/L 137  139    Potassium 3.5 - 5.1 mmol/L 4.4  4.1    Chloride 98 - 111 mmol/L 106  106    CO2 22 - 32 mmol/L 19  22    Calcium 8.9 - 10.3 mg/dL 9.3  9.2    Total Protein 6.5 - 8.1 g/dL  6.7  7.6   Total Bilirubin 0.0 - 1.2 mg/dL  0.5  0.3   Alkaline Phos 38 - 126 U/L  104  118   AST 15 - 41 U/L  31  32   ALT 0 - 44 U/L  21  23     No results found.  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 Likely due to uncontrolled hyperthyroidism with tachycardia along with cocaine use. Carvedilol dose increased from 6.25 mg BID to 12.5 mg BID due to elevated HR. Believe her SOB on movement is likely due to her low EF. Will give a dose of Lasix  20 mg for relief.    - Carvedilol 12.5 mg twice daily - Continue losartan 25 mg daily - Monitor on telemetry - Lasix  20 mg PO once    Graves disease, dx 2014 Hyperthyroidism Sustained tachycardia likely due to no medication use. Tried methimazole  in the past that led to DILI. PTU contraindicated for DILI. Endocrinologist consulted from  PAL at [facility] who recommended  starting pt on short course of steroid therapy for 3-5 days. Pt currently on 40 mg prednisone  daily. Will recheck thyroid  levels today. Dr. Roark from ENT recommended to reach out to Dr. Krystal Spinner to perform her thyroidectomy or transfer her to [facility], due to the complexity of the case. GS on-call does not perform thyroidectomies and recommended pt f/u with Dr. Spinner outpatient. Discussed the surgery options including transfer to [facility], surgery in [place], or OP surgery with Dr. Spinner. Pt stated understanding and said she will think about it. She seemed very anxious. Will monitor thyroid  studies today and reevaluate her tomorrow for further discussuon on her thyroidectomy.   - Continue Prednisone  40 mg daily - T3, T4, TSH results pending  -Carvedilol as above -Need to finalize surgery plan that works best for her -Will monitor for symptoms of thyroid  storm including hyperthermia, arrhythmias, encephalopathy   Tachycardia Likely due to hyperthyroidism, cocaine use.  Low suspicion of PE at this time as TTE did not show any right heart strain.  Will hold on CTA at this time, pt previously declined.   Polysubstance use disorder TOC to help with resources to d/c cocaine use. SW recommended pt had to cancel her [place] medicaid and then apply for [place] medicaid.     -TOC consult placed for substance use disorder resources -SW to help with insurance issues   STI testing -G/C results pending    Microcytic anemia Hgb stable.  Iron studies consistent with deficiency. Received 2 doses of IV iron. Consider starting her on oral fe supplementation.   -IV iron x2 -B12 1000 mcg daily    Best Practice: Diet: Cardiac diet IVF: none VTE: Xarelto 10 mg  SCDs Start: 02/27/24 1058 Code: Full   Disposition planning:   DISPO: Discharge pending clinical improvement   Signature:  Rebecka Edgardo [facility]  2:31 PM, 03/01/2024  On Call pager  [phone]

## 2024-03-01 NOTE — Consult Note (Addendum)
 Reason for Consult:graves disease Referring Physician: Dr Francesco Raquel Mary Frey is an 48 y.o. female.  HPI: pt with Graves' disease/hyperthyroidism (currently not on therapy due to methimazole -induced liver injury), hypertension, hyperlipidemia, and polysubstance use disorder  presented with a two-week history of chest pain and progressive shortness of breath and was admitted for newly diagnosed heart failure with reduced ejection fraction and uncontrolled hyperthyroidism. Her methimazole  was discontinued for liver injury. Endocrinology was consulted for thyroid  hormone reduction and steroids recommended. The 11/8 she was 272 T3 and repeat pending. Still with tachycardia and trying to control.   Past Medical History:  Diagnosis Date   Anemia    Chronic back pain    Fibroids    Graves disease    Hypertension    Pregnancy induced hypertension     Past Surgical History:  Procedure Laterality Date   IR US  GUIDE BX ASP/DRAIN  10/29/2019   NO PAST SURGERIES      Family History  Problem Relation Age of Onset   Diabetes Maternal Aunt    Diabetes Maternal Uncle    Diabetes Maternal Grandmother    Diabetes Maternal Grandfather    Stroke Paternal Grandfather    Breast cancer Mother     Social History:  reports that she has been smoking cigarettes. She has never used smokeless tobacco. She reports current alcohol  use. She reports current drug use. Drug: Marijuana.  Allergies:  Allergies  Allergen Reactions   Latex Itching, Rash and Other (See Comments)    Reaction Type: Allergy   Methimazole  Diarrhea    Liver toxicity    Ptu [Propylthiouracil] Other (See Comments)    Liver toxicity    Hydrocodone Nausea And Vomiting   Hydrocodone-Acetaminophen  Other (See Comments)    Reaction Type: Allergy    Medications: I have reviewed the patient's current medications.  Results for orders placed or performed during the hospital encounter of 02/27/24 (from the past 48 hours)  CBC      Status: Abnormal   Collection Time: 02/29/24  8:06 AM  Result Value Ref Range   WBC 7.8 4.0 - 10.5 K/uL   RBC 5.22 (H) 3.87 - 5.11 MIL/uL   Hemoglobin 10.0 (L) 12.0 - 15.0 g/dL   HCT 68.0 (L) 63.9 - 53.9 %   MCV 61.1 (L) 80.0 - 100.0 fL    Comment: REPEATED TO VERIFY   MCH 19.2 (L) 26.0 - 34.0 pg   MCHC 31.3 30.0 - 36.0 g/dL   RDW 80.2 (H) 88.4 - 84.4 %   Platelets 305 150 - 400 K/uL    Comment: REPEATED TO VERIFY   nRBC 0.4 (H) 0.0 - 0.2 %    Comment: Performed at [facility], [address], [place], [place] [address]  Basic metabolic panel     Status: Abnormal   Collection Time: 02/29/24  8:06 AM  Result Value Ref Range   Sodium 137 135 - 145 mmol/L   Potassium 4.4 3.5 - 5.1 mmol/L   Chloride 106 98 - 111 mmol/L   CO2 19 (L) 22 - 32 mmol/L   Glucose, Bld 99 70 - 99 mg/dL    Comment: Glucose reference range applies only to samples taken after fasting for at least 8 hours.   BUN 9 6 - 20 mg/dL   Creatinine, Ser 9.46 0.44 - 1.00 mg/dL   Calcium 9.3 8.9 - 89.6 mg/dL   GFR, Estimated >39 >39 mL/min    Comment: (NOTE) Calculated using the CKD-EPI Creatinine Equation (  2021)    Anion gap 12 5 - 15    Comment: Performed at [facility], [address], [place], [place] [address]    No results found.  ROS Blood pressure (!) 130/94, pulse 100, temperature 98.3 F (36.8 C), temperature source Oral, resp. rate 19, height 5' 5 (1.651 m), weight 50.3 kg, SpO2 100%. Physical Exam Constitutional:      Appearance: Normal appearance.  HENT:     Head: Normocephalic and atraumatic.     Nose: Nose normal. Turbinates with mild hypertrophy, No significant swelling or masses.     Oral cavity/oropharynx: Mucous membranes are moist. No lesions or masses    Larynx: normal voice. Mirror attempted without success    Eyes:     Extraocular Movements: Extraocular movements intact.     Conjunctiva/sclera: Conjunctivae normal.     Pupils: Pupils are equal, round, and reactive to  light.  Cardiovascular:     Rate and Rhythm: increased rate.  Pulmonary:     Effort: Pulmonary effort is normal.  Musculoskeletal:     Cervical back: Normal range of motion and neck supple. No rigidity.  Lymphadenopathy:     Cervical: No cervical adenopathy or masses.salivary glands without lesions. .  Neurological:     Mental Status: He is alert. CN 2-12 intact. No nystagmus      Assessment/Plan: Graves disease-she has a complicated medical history.  She obviously by history cannot take the medications for reduction of her thyroid  hormone and steroids are currently in progress.  She still is having tachycardia.  I have discussed this case with my partners all of which have reservations given they feel like this patient needs to have expedited thyroidectomy with not a prolonged anesthesia.  They are worried they cannot provide that service and thus feel the better course of action is to refer the patient to tertiary center or call Dr. Eletha with general surgery and see if he is comfortable.I discussed this with the patient and Dr Edgardo.   Mary Frey 03/01/2024, 8:48 AM

## 2024-03-01 NOTE — TOC CM/SW Note (Signed)
 Transition of Care (TOC) CM/SW Note    CSW spoke with patient at bedside. Patient reports she lives at home alone. Patient reports her plan return back home when ready for dc. Patient reports she will have transportation when medically ready. Cage Aid completed. CSW offered patient outpatient substance use treatment services resources. Patient politely declined. Patient reports she already has resources. All questions answered. No further questions reported at this time.

## 2024-03-01 NOTE — TOC Initial Note (Signed)
 Transition of Care [facility]) - Initial/Assessment Note    Patient Details  Name: Mary Frey MRN: [id number] Date of Birth: [birth year]  Transition of Care [facility]) CM/SW Contact:    Sudie Erminio Deems, RN Phone Number: 03/01/2024, 1:52 PM  Clinical Narrative: Patient presented for chest pain. Patient states she lives in [place] . Patient states her PCP is located in [place] . Patient states her medications are free. No further needs identified at this time.                  Expected Discharge Plan: Home/Self Care Barriers to Discharge: No Barriers Identified   Patient Goals and CMS Choice Patient states their goals for this hospitalization and ongoing recovery are:: Plan is to return to [facility] [place] -address in the chart reflects [place]          Expected Discharge Plan and Services In-house Referral: NA Discharge Planning Services: CM Consult Post Acute Care Choice: NA Living arrangements for the past 2 months: Single Family Home                   DME Agency: NA       HH Arranged: NA  Prior Living Arrangements/Services Living arrangements for the past 2 months: Single Family Home   Patient language and need for interpreter reviewed:: Yes Do you feel safe going back to the place where you live?: Yes      Need for Family Participation in Patient Care: No (Comment) Care giver support system in place?: No (comment)   Criminal Activity/Legal Involvement Pertinent to Current Situation/Hospitalization: No - Comment as needed  Activities of Daily Living   ADL Screening (condition at time of admission) Independently performs ADLs?: Yes (appropriate for developmental age) Is the patient deaf or have difficulty hearing?: No Does the patient have difficulty seeing, even when wearing glasses/contacts?: No Does the patient have difficulty concentrating, remembering, or making decisions?: No  Permission Sought/Granted Permission sought to share  information with : Case Manager                Emotional Assessment Appearance:: Appears stated age Attitude/Demeanor/Rapport: Unable to Assess Affect (typically observed): Unable to Assess Orientation: : Oriented to Place, Oriented to  Time, Oriented to Situation, Oriented to Self Alcohol  / Substance Use: Not Applicable Psych Involvement: No (comment)  Admission diagnosis:  Shortness of breath [R06.02] Sinus tachycardia [R00.0] Hyponatremia [E87.1] Microcytic anemia [D50.9] Elevated troponin [R79.89] Elevated d-dimer [R79.89] Chest pain [R07.9] Patient Active Problem List   Diagnosis Date Noted   HFrEF (heart failure with reduced ejection fraction) (HCC) 02/28/2024   Chest pain 02/27/2024   Shortness of breath 02/27/2024   Sinus tachycardia 02/27/2024   Transaminitis 05/13/2021   Dehydration 05/13/2021   Abnormal EKG 05/13/2021   Leukocytosis 05/13/2021   Hyperthyroidism 05/13/2021   Diarrhea 05/12/2021   Elevated LFTs 05/11/2021   Jaundice    Abnormal liver function 10/30/2019   Liver failure (HCC) 10/29/2019   Abnormal LFTs 10/12/2019   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 [place] [address] Phone: [phone] Fax: [phone]  [id number] [place], [place] - [address] AT [facility] OF [address] & [address] [place]  [address] Phone: [phone] Fax: [phone]  [facility] [address] [place] [address] Phone: [phone] Fax: [phone]     Social Drivers of Health (SDOH) Social History: SDOH Screenings   Food Insecurity: No Food Insecurity (02/27/2024)  Housing: Low Risk  (02/27/2024)  Transportation Needs: Unmet Transportation Needs (02/27/2024)  Utilities: Not At Risk (02/27/2024)  Social Connections: Unknown (09/02/2021)   Received from [facility]  Tobacco Use: High Risk (02/27/2024)   SDOH Interventions:     Readmission Risk Interventions     No data to display

## 2024-03-02 DIAGNOSIS — I5020 Unspecified systolic (congestive) heart failure: Secondary | ICD-10-CM | POA: Diagnosis not present

## 2024-03-02 DIAGNOSIS — R000 Tachycardia, unspecified: Secondary | ICD-10-CM | POA: Diagnosis not present

## 2024-03-02 DIAGNOSIS — E0500 Thyrotoxicosis with diffuse goiter without thyrotoxic crisis or storm: Secondary | ICD-10-CM | POA: Diagnosis not present

## 2024-03-02 DIAGNOSIS — F1490 Cocaine use, unspecified, uncomplicated: Secondary | ICD-10-CM | POA: Diagnosis not present

## 2024-03-02 LAB — CBC
HCT: 31.6 % — ABNORMAL LOW (ref 36.0–46.0)
Hemoglobin: 9.8 g/dL — ABNORMAL LOW (ref 12.0–15.0)
MCH: 19.7 pg — ABNORMAL LOW (ref 26.0–34.0)
MCHC: 31 g/dL (ref 30.0–36.0)
MCV: 63.5 fL — ABNORMAL LOW (ref 80.0–100.0)
Platelets: 295 K/uL (ref 150–400)
RBC: 4.98 MIL/uL (ref 3.87–5.11)
RDW: 21.7 % — ABNORMAL HIGH (ref 11.5–15.5)
WBC: 13.3 K/uL — ABNORMAL HIGH (ref 4.0–10.5)
nRBC: 0.3 % — ABNORMAL HIGH (ref 0.0–0.2)

## 2024-03-02 LAB — BASIC METABOLIC PANEL WITH GFR
Anion gap: 7 (ref 5–15)
BUN: 10 mg/dL (ref 6–20)
CO2: 23 mmol/L (ref 22–32)
Calcium: 8.7 mg/dL — ABNORMAL LOW (ref 8.9–10.3)
Chloride: 111 mmol/L (ref 98–111)
Creatinine, Ser: 0.53 mg/dL (ref 0.44–1.00)
GFR, Estimated: >60 mL/min (ref >60)
Glucose, Bld: 99 mg/dL (ref 70–99)
Potassium: 3.7 mmol/L (ref 3.5–5.1)
Sodium: 141 mmol/L (ref 135–145)

## 2024-03-02 LAB — T3: T3, Total: 202 ng/dL — ABNORMAL HIGH (ref 71–180)

## 2024-03-02 MED ORDER — ENSURE PLUS HIGH PROTEIN PO LIQD
237.0000 mL | Freq: Three times a day (TID) | ORAL | Status: DC
  Administered 2024-03-02 – 2024-03-05 (×11): 237 mL via ORAL

## 2024-03-02 MED ORDER — FUROSEMIDE 20 MG PO TABS
20.0000 mg | ORAL_TABLET | Freq: Once | ORAL | Status: AC
  Administered 2024-03-02: 20 mg via ORAL
  Filled 2024-03-02: qty 1

## 2024-03-02 NOTE — Plan of Care (Signed)

## 2024-03-02 NOTE — Progress Notes (Addendum)
 HD#3 SUBJECTIVE:  Patient Summary:Mary Frey is a 48 y.o. 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 She is currently undergoing a steroid trial in preparation for a planned thyroidectomy.   Overnight Events:  None   Interim History:  Patient states that she is feeling the effects of her thyroid  on the rest of her body with her heart beating fast and her feeling very tired. She had many questions about the procedure. States that she is open to radioiodine ablation and transfer if needed for this procedure. Agree that this would be the best option for this patient.  OBJECTIVE:  Vital Signs: Vitals:   03/01/24 1947 03/02/24 0003 03/02/24 0540 03/02/24 0757  BP: 120/79 133/81 121/85 119/89  Pulse: (!) 105 99 93   Resp: 16 18 15 16   Temp: (!) 97.5 F (36.4 C) 97.6 F (36.4 C) 98.2 F (36.8 C) 97.7 F (36.5 C)  TempSrc: Oral Oral Oral Oral  SpO2: 100%     Weight:   50.5 kg   Height:       Supplemental O2: Room Air SpO2: 100 %  Filed Weights   02/29/24 0607 03/01/24 0412 03/02/24 0540  Weight: 49.5 kg 50.3 kg 50.5 kg     Intake/Output Summary (Last 24 hours) at 03/02/2024 0800 Last data filed at 03/02/2024 0400 Gross per 24 hour  Intake 440 ml  Output 0 ml  Net 440 ml   Net IO Since Admission: 1,547 mL [03/02/24 0800]  Physical Exam: Physical Exam Constitutional:      General: She is not in acute distress. HENT:     Head: Normocephalic.  Cardiovascular:     Rate and Rhythm: Tachycardia present.     Comments: Radial pulses palpable bilaterally  Pulmonary:     Breath sounds: Normal breath sounds.  Musculoskeletal:     Right lower leg: No edema.     Left lower leg: No  edema.  Skin:    General: Skin is warm.  Neurological:     Mental Status: She is alert.     Patient Lines/Drains/Airways Status     Active Line/Drains/Airways     Name Placement date Placement time Site Days   Peripheral IV 02/29/24 22 G 1.75 Anterior;Left Forearm 02/29/24  1840  Forearm  1            Pertinent labs and imaging:      Latest Ref Rng & Units 02/29/2024    8:06 AM 02/28/2024    6:58 AM 02/27/2024    3:47 AM  CBC  WBC 4.0 - 10.5 K/uL 7.8  6.0  6.5   Hemoglobin 12.0 - 15.0 g/dL 89.9  8.8  9.2   Hematocrit 36.0 - 46.0 % 31.9  27.5  29.8   Platelets 150 - 400 K/uL 305  241  258        Latest Ref Rng & Units 02/29/2024    8:06 AM 02/28/2024    6:52 AM 02/27/2024    5:43 AM  CMP  Glucose 70 - 99 mg/dL 99  895    BUN 6 - 20 mg/dL 9  12    Creatinine 9.55 - 1.00 mg/dL 9.46  9.47    Sodium 864 - 145 mmol/L 137  139    Potassium 3.5 - 5.1 mmol/L 4.4  4.1    Chloride 98 - 111 mmol/L 106  106    CO2 22 - 32 mmol/L 19  22    Calcium 8.9 - 10.3 mg/dL 9.3  9.2    Total Protein 6.5 - 8.1 g/dL  6.7  7.6   Total Bilirubin 0.0 - 1.2 mg/dL  0.5  0.3   Alkaline Phos 38 - 126 U/L  104  118   AST 15 - 41 U/L  31  32   ALT 0 - 44 U/L  21  23     No results found.  ASSESSMENT/PLAN:  Assessment: 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   Hyperthyroidism   Shortness of breath   Sinus tachycardia   HFrEF (heart failure with reduced ejection fraction) (HCC) Mary Frey is a 48 y.o. 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 She is currently undergoing a steroid  trial in preparation for a planned thyroidectomy.   Plan: HFrEF Sinus Tachycardia  EF is 40-45% on this admission. Do not believe that patient is overtly hypervolemic at this time but could benefit from some diuresis - lasix  20 mg once   - Carvedilol 12.5 mg twice daily - Continue losartan 25 mg daily - Monitor on telemetry   Graves disease, dx 2014 Hyperthyroidism Speak to patient today and went over benefits and risks of surgery versus radioiodine ablation.  Patient states that she is feeling the effects she feels like of her thyroid  on her.  She is now amenable to radioiodine ablation of her thyroid  and would prefer not to get surgery.  She also understands that she will likely need to be transferred to another facility to get these things done.  Will go ahead and start transfer process.  [facility] called for transfer and have given them patient details.  Secretary of [address] said that she would send over facesheet for the patient. - Continue Prednisone  40 mg daily -Free T4 is 2.14 down from 2.39 on admission, T3 is 202 down from 272 3 days ago, TSH is still undetectable. -Carvedilol as above -Will monitor for symptoms of thyroid  storm including hyperthermia, arrhythmias, encephalopathy -Continue to check in with transfer process   Tachycardia Likely due to hyperthyroidism, cocaine use.  Lower differential for PE as echo did not show any signs of right heart strain and patient is hemodynamically stable and saturating well on room air.    Polysubstance use disorder TOC to help with resources to d/c cocaine use. SW recommended pt had to cancel her [place] medicaid and then apply for [place] medicaid.    -TOC consult placed for substance use disorder resources -SW to help with insurance issues   STI testing -clear     Microcytic anemia Hgb stable.  Iron studies consistent with deficiency. Received 2 doses of IV iron. Consider starting her on oral fe supplementation.  -IV iron x2 -B12 1000 mcg  daily  Mild leukocytosis - otherwise hemodynamically stable and afebrile. Likely reactive to steroids     Best Practice: Diet: Cardiac diet IVF: none  VTE: Xarelto 10 mg  SCDs Start: 02/27/24 1058 Code: Full   Disposition planning:   DISPO: pending transfer to [facility]   Signature:  Satin Boal D'Mello [facility]  8:00 AM, 03/02/2024  On Call pager [phone]

## 2024-03-03 ENCOUNTER — Encounter: Payer: Self-pay | Admitting: Oncology

## 2024-03-03 DIAGNOSIS — E0500 Thyrotoxicosis with diffuse goiter without thyrotoxic crisis or storm: Secondary | ICD-10-CM | POA: Diagnosis not present

## 2024-03-03 DIAGNOSIS — R000 Tachycardia, unspecified: Secondary | ICD-10-CM | POA: Diagnosis not present

## 2024-03-03 DIAGNOSIS — E440 Moderate protein-calorie malnutrition: Secondary | ICD-10-CM | POA: Insufficient documentation

## 2024-03-03 DIAGNOSIS — F1490 Cocaine use, unspecified, uncomplicated: Secondary | ICD-10-CM | POA: Diagnosis not present

## 2024-03-03 DIAGNOSIS — I5020 Unspecified systolic (congestive) heart failure: Secondary | ICD-10-CM | POA: Diagnosis not present

## 2024-03-03 LAB — T4, FREE: Free T4: 1.64 ng/dL — ABNORMAL HIGH (ref 0.61–1.12)

## 2024-03-03 LAB — CBC
HCT: 30.9 % — ABNORMAL LOW (ref 36.0–46.0)
Hemoglobin: 9.7 g/dL — ABNORMAL LOW (ref 12.0–15.0)
MCH: 20.3 pg — ABNORMAL LOW (ref 26.0–34.0)
MCHC: 31.4 g/dL (ref 30.0–36.0)
MCV: 64.5 fL — ABNORMAL LOW (ref 80.0–100.0)
Platelets: 298 K/uL (ref 150–400)
RBC: 4.79 MIL/uL (ref 3.87–5.11)
RDW: 22.5 % — ABNORMAL HIGH (ref 11.5–15.5)
WBC: 16.7 K/uL — ABNORMAL HIGH (ref 4.0–10.5)
nRBC: 0.2 % (ref 0.0–0.2)

## 2024-03-03 LAB — BASIC METABOLIC PANEL WITH GFR
Anion gap: 11 (ref 5–15)
BUN: 18 mg/dL (ref 6–20)
CO2: 22 mmol/L (ref 22–32)
Calcium: 8.6 mg/dL — ABNORMAL LOW (ref 8.9–10.3)
Chloride: 107 mmol/L (ref 98–111)
Creatinine, Ser: 0.53 mg/dL (ref 0.44–1.00)
GFR, Estimated: >60 mL/min (ref >60)
Glucose, Bld: 117 mg/dL — ABNORMAL HIGH (ref 70–99)
Potassium: 3.7 mmol/L (ref 3.5–5.1)
Sodium: 140 mmol/L (ref 135–145)

## 2024-03-03 MED ORDER — TRIAMCINOLONE ACETONIDE 0.1 % EX CREA
1.0000 | TOPICAL_CREAM | CUTANEOUS | Status: DC | PRN
  Administered 2024-03-04: 1 via TOPICAL

## 2024-03-03 MED ORDER — ADULT MULTIVITAMIN W/MINERALS CH
1.0000 | ORAL_TABLET | Freq: Every day | ORAL | Status: DC
  Administered 2024-03-03 – 2024-03-05 (×3): 1 via ORAL
  Filled 2024-03-03 (×3): qty 1

## 2024-03-03 NOTE — Progress Notes (Addendum)
 Spoke with Dr. Darron of [facility] medicine regarding this patient's transfer for thyroidectomy. They are willing to accept patient in transfer and will call back after arrangements have been made.  Ozell Kung MD 03/03/2024, 8:17 PM   Update overnight: [facility] ENT department unwilling to accept in transfer for thyroidectomy, recommend medical endocrine optimization prior to considering surgery. Transfer declined.

## 2024-03-03 NOTE — Progress Notes (Signed)
 HD#4 SUBJECTIVE:  Patient Summary: Mary Frey is a 48 y.o. 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 She is currently undergoing a steroid trial in preparation for a planned thyroidectomy.   Overnight Events: none   Interim History:  Saw pt at bedside this AM. Denies any SOB, CP. Agreed she would look into options in [place] for thyroidectomy.   OBJECTIVE:  Vital Signs: Vitals:   03/02/24 2056 03/03/24 0430 03/03/24 0843 03/03/24 1324  BP: 102/78 (!) 121/90 134/89 120/86  Pulse: (!) 101 94 97 (!) 106  Resp: 18 16 17 18   Temp: (!) 97.5 F (36.4 C) 97.8 F (36.6 C) 97.9 F (36.6 C) 97.9 F (36.6 C)  TempSrc: Oral Oral Oral Oral  SpO2: 100% 100% 99% 100%  Weight:  50.8 kg    Height:       Supplemental O2: Room Air SpO2: 100 %  Filed Weights   03/01/24 0412 03/02/24 0540 03/03/24 0430  Weight: 50.3 kg 50.5 kg 50.8 kg    No intake or output data in the 24 hours ending 03/03/24 1441 Net IO Since Admission: 1,907 mL [03/03/24 1441]  Physical Exam: Physical Exam HENT:     Head: Normocephalic.  Neck:     Vascular: No JVD.  Cardiovascular:     Comments: Mild tachycardia  Pulmonary:     Effort: Pulmonary effort is normal.     Breath sounds: Normal breath sounds.  Musculoskeletal:     Right lower leg: No edema.     Left lower leg: No edema.  Neurological:     Mental Status: She is alert.     Patient Lines/Drains/Airways Status     Active Line/Drains/Airways     Name Placement date Placement time Site Days   Peripheral IV 02/29/24 22 G 1.75 Anterior;Left Forearm 02/29/24  1840  Forearm  3            Pertinent labs and imaging:     Latest Ref Rng & Units  03/03/2024   10:23 AM 03/02/2024   11:20 AM 02/29/2024    8:06 AM  CBC  WBC 4.0 - 10.5 K/uL 16.7  13.3  7.8   Hemoglobin 12.0 - 15.0 g/dL 9.7  9.8  89.9   Hematocrit 36.0 - 46.0 % 30.9  31.6  31.9   Platelets 150 - 400 K/uL 298  295  305        Latest Ref Rng & Units 03/03/2024   10:23 AM 03/02/2024   11:20 AM 02/29/2024    8:06 AM  CMP  Glucose 70 - 99 mg/dL 882  99  99   BUN 6 - 20 mg/dL 18  10  9    Creatinine 0.44 - 1.00 mg/dL 9.46  9.46  9.46   Sodium 135 - 145 mmol/L 140  141  137   Potassium 3.5 - 5.1 mmol/L 3.7  3.7  4.4   Chloride 98 - 111 mmol/L 107  111  106   CO2 22 - 32 mmol/L 22  23  19    Calcium 8.9 - 10.3 mg/dL 8.6  8.7  9.3     No results found.  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 PTU contraindicated due to similar side effects. Currently, needs thyroidectomy to control her elevated thyroid  levels. Tried 5 day course of prednisone  to help control thyroid  levels--elevated WBC likely due to this. Improvement in T4 levels. ENT at [facility] deferred surgery due to complexity of case. [facility] deferred transfer due to [facility] having [facility] insurance. [facility] did not accept transfer. Reached out to [facility] who opened the case for pt. Case manager handling transfer paperwork. Will proceed with transfer as soon as bed becomes available and pending acceptance of insurance.    - Completed Prednisone  40 mg daily: day 5/5 - Free T4 down to 1.64 from 2.14 on admission - Carvedilol 12.5 mg twice daily -Awaiting on transfer acceptance to [facility] -Will monitor for symptoms of thyroid  storm including hyperthermia,  arrhythmias, encephalopathy  HFrEF Sinus Tachycardia  Echo on admission showed worsened EF of 40-45% from previous findings. Likely due to uncontrolled hyperthyroidism with tachycardia along with cocaine use. Pt did not appear volume overloaded on her exam today. Her HR controlled and remained < 100 overnight.   - Carvedilol 12.5 mg twice daily - Continue losartan 25 mg daily - Monitor on telemetry   Tachycardia Likely due to hyperthyroidism, cocaine use.  Low suspicion of PE at this time as TTE did not show any right heart strain.  Will hold on CTA at this time.   Polysubstance use disorder TOC to help with resources to d/c cocaine use. SW recommended pt had to cancel her [place] medicaid and then apply for [facility] medicaid which was informed to pt to which she stated understanding.     -TOC consult placed for substance use disorder resources -SW to help with insurance issues   STI testing -G/C results came back negative - Non-reactive for HIV   Microcytic anemia Hgb stable.  Iron studies consistent with deficiency. Received 2 doses of IV iron.    -IV iron x2 completed  -B12 1000 mcg daily  Best Practice: Diet: Cardiac diet IVF: none VTE: Xarelto 10 mg  SCDs Start: 02/27/24 1058 Code: Full   Disposition planning:   DISPO: Potential transfer to [facility] pending approval     Signature:  Rebecka Edgardo [facility]  2:41 PM, 03/03/2024  On Call pager [phone]

## 2024-03-03 NOTE — Plan of Care (Signed)

## 2024-03-03 NOTE — TOC Progression Note (Addendum)
 Transition of Care [facility]) - Progression Note    Patient Details  Name: Mary Frey MRN: [id number] Date of Birth: February 17, 1976  Transition of [facility] CM/SW Contact  Graves-Bigelow, Erminio Deems, RN Phone Number: 03/03/2024, 12:35 PM  Clinical Narrative:  ICM received notification from the provider that the patient is agreeable to transfer to [facility]. ICM spoke with patient regarding clinicals to be sent to [facility] and patient has not signed the medical release form. Clinicals to be faxed to [phone] once the medical release is signed. ICM spoke to the flow coordinator @ [phone] extension: [phone]--Patient's demographic sheet to be faxed to [phone]. [facility] Address is [address] [place]  [phone]. Flow Coordinator is asking that if patient is accepted and they have bed availability that the [facility] will need to arrange for transportation to [place]. Flow Coordinator has the number to the unit and she states that if the patient is accepted she will reach out to the provider first. No further needs identified at this time.    1650 03-03-24 Patient did not want to sign the medical release because she wanted to speak with the MD first before clinicals faxed. MD did speak with patient regarding the clinicals. Medical Face Sheet only was submitted this am. ICM awaiting information to be faxed to the unit from [facility] to send back to [facility]- CM called twice for fax to be sent. Per Flow Coordinator, the information has to be faxed back within 24 hours and if not the case will be canceled. Flow Coordinator can be reached at [phone] ext [phone]. Clinicals to be faxed to [phone]. MD, Staff RN and Charge RN are aware that this needs to be completed.     Expected Discharge Plan: Acute to Acute Transfer Barriers to Discharge: No Barriers Identified  Expected Discharge Plan and Services In-house Referral: NA Discharge Planning Services: CM Consult Post Acute Care Choice: NA Living  arrangements for the past 2 months: Single Family Home                   DME Agency: NA       HH Arranged: NA   Social Drivers of Health (SDOH) Interventions SDOH Screenings   Food Insecurity: No Food Insecurity (02/27/2024)  Housing: Low Risk  (02/27/2024)  Transportation Needs: Unmet Transportation Needs (02/27/2024)  Utilities: Not At Risk (02/27/2024)  Social Connections: Unknown (09/02/2021)   Received from [facility]  Tobacco Use: High Risk (02/27/2024)   Readmission Risk Interventions     No data to display

## 2024-03-03 NOTE — Progress Notes (Signed)
 Initial Nutrition Assessment  DOCUMENTATION CODES:   Non-severe (moderate) malnutrition in context of chronic illness  INTERVENTION:  Continue Heart diet  Encourage PO intake  Ensure Plus High Protein po TID, each supplement provides 350 kcal and 20 grams of protein. MVI w/minerals daily  Continue B12 supplementation for deficiency    NUTRITION DIAGNOSIS:   Moderate Malnutrition related to chronic illness as evidenced by mild fat depletion, moderate muscle depletion.   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 Pt with hyperthyroidism and plan for removal/ablation in the future. Pt reports good appetite at baseline, typically eats three meals a day an recently has been recently trying to incorporate more vegetables into her diet. Pt reports that she has always been smaller/leaner at baseline but has struggled with her weight gain due to her hyperactive thyroid .  Weight for 5% weight loss over over the past 4 months, not clinically significant but still concerning. Pt reports intake since admission depends on what food she is able to order, currently on heart diet. Pt prefers vanilla ensure, already drank one bottle this morning. Pt had questions on ways to help put on weight after thyroid  removal. RD discussed increasing calorie and protein intake at meals and to incorporate nutrition supplements like ensure or boost 2-3 times per day.    Admit weight: 53.5  kg - copied forward from previous encounter  Current weight: 50.8 kg  Nutritionally Relevant Medications: B12, ferrous sulfate , prednisone   Labs Reviewed: B12 <150   NUTRITION - FOCUSED PHYSICAL EXAM:  Flowsheet Row Most Recent Value  Orbital Region No depletion  Upper Arm Region Moderate depletion  Thoracic and Lumbar Region Mild depletion  Buccal Region Mild depletion  Temple Region Moderate depletion  Clavicle Bone Region Moderate depletion  Clavicle and Acromion Bone Region Moderate depletion  Scapular Bone Region Moderate depletion  Dorsal Hand Mild depletion  Patellar Region Mild depletion  Anterior Thigh Region Mild depletion  Posterior Calf Region Moderate depletion  Hair Reviewed  Eyes Reviewed  Mouth Reviewed  Skin Reviewed  Nails Reviewed    Diet Order:   Diet Order             Diet Heart Room service appropriate? Yes; Fluid consistency: Thin  Diet effective now                   EDUCATION NEEDS:   Education needs have been addressed  Skin:  Skin Assessment: Reviewed RN Assessment  Last BM:  11/11  Height:   Ht Readings from Last 1 Encounters:  02/27/24 5' 5 (1.651 m)    Weight:   Wt Readings from Last 1 Encounters:  03/03/24 50.8 kg    Ideal Body Weight:  56.8 kg  BMI:  Body mass index is 18.65 kg/m.  Estimated Nutritional Needs:   Kcal:  1500-1775  Protein:  50-75 g  Fluid:  1.5-1.7L/d  Madalyn Potters, MS, RD, LDN Clinical Dietitian  Contact via secure chat. If unavailable, use group chat RD  Inpatient.

## 2024-03-04 ENCOUNTER — Other Ambulatory Visit (HOSPITAL_COMMUNITY): Payer: Self-pay

## 2024-03-04 LAB — BASIC METABOLIC PANEL WITH GFR
Anion gap: 8 (ref 5–15)
BUN: 16 mg/dL (ref 6–20)
CO2: 24 mmol/L (ref 22–32)
Calcium: 9.2 mg/dL (ref 8.9–10.3)
Chloride: 104 mmol/L (ref 98–111)
Creatinine, Ser: 0.55 mg/dL (ref 0.44–1.00)
GFR, Estimated: >60 mL/min (ref >60)
Glucose, Bld: 106 mg/dL — ABNORMAL HIGH (ref 70–99)
Potassium: 4 mmol/L (ref 3.5–5.1)
Sodium: 136 mmol/L (ref 135–145)

## 2024-03-04 LAB — CBC
HCT: 33.2 % — ABNORMAL LOW (ref 36.0–46.0)
Hemoglobin: 10.5 g/dL — ABNORMAL LOW (ref 12.0–15.0)
MCH: 20.2 pg — ABNORMAL LOW (ref 26.0–34.0)
MCHC: 31.6 g/dL (ref 30.0–36.0)
MCV: 64 fL — ABNORMAL LOW (ref 80.0–100.0)
Platelets: 342 K/uL (ref 150–400)
RBC: 5.19 MIL/uL — ABNORMAL HIGH (ref 3.87–5.11)
RDW: 23.9 % — ABNORMAL HIGH (ref 11.5–15.5)
WBC: 19.9 K/uL — ABNORMAL HIGH (ref 4.0–10.5)
nRBC: 0.2 % (ref 0.0–0.2)

## 2024-03-04 MED ORDER — ZINC OXIDE 12.8 % EX OINT
TOPICAL_OINTMENT | CUTANEOUS | Status: DC | PRN
  Filled 2024-03-04: qty 56.7

## 2024-03-04 MED ORDER — FUROSEMIDE 20 MG PO TABS
20.0000 mg | ORAL_TABLET | Freq: Once | ORAL | Status: AC
  Administered 2024-03-04: 20 mg via ORAL
  Filled 2024-03-04: qty 1

## 2024-03-04 NOTE — Progress Notes (Signed)
 HD#5 SUBJECTIVE:  Patient Summary: Mary Frey is a 48 y.o. 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 She is recently completed a 5-day steroid trial in preparation for a planned thyroidectomy.   Overnight Events: [facility] transfer declined   Interim History:  Saw pt at bedside. Denies any SOB. Reports palpitations when she tries to walk around. Has pain in her upper back and chest which has been there since admission. Feels well overall.   OBJECTIVE:  Vital Signs: Vitals:   03/04/24 1052 03/04/24 1144 03/04/24 1648 03/04/24 1709  BP: 119/84 111/76 114/79 114/79  Pulse: 98 98 92 99  Resp:  17 19   Temp:  97.6 F (36.4 C) 97.9 F (36.6 C)   TempSrc:  Oral Oral   SpO2:   100%   Weight:      Height:       Supplemental O2: Room Air SpO2: 100 %  Filed Weights   03/01/24 0412 03/02/24 0540 03/03/24 0430  Weight: 50.3 kg 50.5 kg 50.8 kg     Intake/Output Summary (Last 24 hours) at 03/04/2024 1712 Last data filed at 03/04/2024 9090 Gross per 24 hour  Intake 480 ml  Output --  Net 480 ml   Net IO Since Admission: 2,387 mL [03/04/24 1712]  Physical Exam: Physical Exam HENT:     Head: Normocephalic.  Cardiovascular:     Rate and Rhythm: Normal rate.  Pulmonary:     Effort: Pulmonary effort is normal. No respiratory distress.     Breath sounds: Normal breath sounds.  Skin:    General: Skin is warm.  Neurological:     Mental Status: She is alert.     Patient Lines/Drains/Airways Status     Active Line/Drains/Airways     Name Placement date Placement time Site Days   Peripheral IV 02/29/24 22 G 1.75 Anterior;Left Forearm 02/29/24  1840  Forearm  4             Pertinent labs and imaging:      Latest Ref Rng & Units 03/04/2024    8:24 AM 03/03/2024   10:23 AM 03/02/2024   11:20 AM  CBC  WBC 4.0 - 10.5 K/uL 19.9  16.7  13.3   Hemoglobin 12.0 - 15.0 g/dL 89.4  9.7  9.8   Hematocrit 36.0 - 46.0 % 33.2  30.9  31.6   Platelets 150 - 400 K/uL 342  298  295        Latest Ref Rng & Units 03/04/2024    8:24 AM 03/03/2024   10:23 AM 03/02/2024   11:20 AM  CMP  Glucose 70 - 99 mg/dL 893  882  99   BUN 6 - 20 mg/dL 16  18  10    Creatinine 0.44 - 1.00 mg/dL 9.44  9.46  9.46   Sodium 135 - 145 mmol/L 136  140  141   Potassium 3.5 - 5.1 mmol/L 4.0  3.7  3.7   Chloride 98 - 111 mmol/L 104  107  111   CO2 22 - 32 mmol/L 24  22  23    Calcium 8.9 - 10.3 mg/dL 9.2  8.6  8.7     No results found.  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 PTU contraindicated due to similar side effects. Currently, needs thyroidectomy to control her elevated thyroid  levels. Completed 5-day course of prednisone  on 03/03/24 to help control thyroid  levels--elevated WBC likely due to this. Improvement in T4 levels.   ENT at [facility] deferred surgery due to complexity of case. [facility] deferred transfer due to [facility] having [facility]. [facility] did not accept transfer. Reached out to [facility] who opened the case for pt, however, declined transfer as there were no beds available and ENT department was unwilling to perform thyroidectomy for which they recommended medical endocrine optimization prior to considering surgery. Pt reports seeing PCP at [facility] in [place]  in the past who had referred her to [facility] several times for PT or even thyroidectomy. Reached out to the residency clinic in [place] who confirmed pt receiving care by them in the past. [facility] do not provide referrals for surgery but they offered a referral for pt to see endocrinologist. Spoke to pt who said she can go to [place] tomorrow but needs to wait on transportation with family until then.    - Completed Prednisone  40 mg daily for 5 days on 03/03/24 - Free T4 down to 1.64 from 2.14 on admission - Carvedilol 12.5 mg twice daily -[facility] declined transfer - Is stable to be discharged tomorrow after transportation is arranged -Will monitor for symptoms of thyroid  storm including hyperthermia, arrhythmias, encephalopathy   HFrEF Sinus Tachycardia  Echo on admission showed worsened EF of 40-45% from previous findings. Likely due to uncontrolled hyperthyroidism with tachycardia along with cocaine use. Pt did not appear volume overloaded on her exam today. Her HR showed some elevations this AM, however, was controlled and remained < 100 during evaluation.   - Carvedilol 12.5 mg twice daily - Continue losartan 25 mg daily - Monitor on telemetry   Tachycardia Likely due to hyperthyroidism, cocaine use.  Low suspicion of PE at this time as TTE did not show any right heart strain.  Will hold on CTA at this time.   Polysubstance use disorder TOC to help with resources to d/c cocaine use. SW recommended pt had to cancel her [place] medicaid and then apply for [facility] medicaid which was informed to pt to which she stated understanding.     -TOC consult placed for substance use disorder resources   STI testing -G/C results came back negative - Non-reactive for HIV   Microcytic anemia Hgb stable.  Iron studies consistent with deficiency. Received 2 doses of IV iron.    -IV iron x2 completed  -B12 1000 mcg daily  Best Practice: Diet: Cardiac diet IVF: none VTE: Xarelto 10 mg  SCDs Start: 02/27/24  1058 Code: Full   Disposition planning:   DISPO: Medically stable, [facility]   Signature:  Rebecka Edgardo [facility]  5:12 PM, 03/04/2024  On Call pager [phone]

## 2024-03-04 NOTE — Plan of Care (Signed)

## 2024-03-04 NOTE — Plan of Care (Signed)
  Problem: Education: Goal: Knowledge of General Education information will improve Description: Including pain rating scale, medication(s)/side effects and non-pharmacologic comfort measures Outcome: Progressing   Problem: Clinical Measurements: Goal: Will remain free from infection Outcome: Progressing   Problem: Clinical Measurements: Goal: Diagnostic test results will improve Outcome: Progressing   Problem: Clinical Measurements: Goal: Cardiovascular complication will be avoided Outcome: Progressing   Problem: Activity: Goal: Risk for activity intolerance will decrease Outcome: Progressing   Problem: Safety: Goal: Ability to remain free from injury will improve Outcome: Progressing   Problem: Skin Integrity: Goal: Risk for impaired skin integrity will decrease Outcome: Progressing   Problem: Pain Managment: Goal: General experience of comfort will improve and/or be controlled Outcome: Progressing

## 2024-03-05 ENCOUNTER — Other Ambulatory Visit (HOSPITAL_COMMUNITY): Payer: Self-pay

## 2024-03-05 ENCOUNTER — Other Ambulatory Visit (HOSPITAL_BASED_OUTPATIENT_CLINIC_OR_DEPARTMENT_OTHER): Payer: Self-pay

## 2024-03-05 ENCOUNTER — Encounter: Payer: Self-pay | Admitting: Oncology

## 2024-03-05 DIAGNOSIS — E0590 Thyrotoxicosis, unspecified without thyrotoxic crisis or storm: Secondary | ICD-10-CM | POA: Diagnosis not present

## 2024-03-05 DIAGNOSIS — E0500 Thyrotoxicosis with diffuse goiter without thyrotoxic crisis or storm: Secondary | ICD-10-CM | POA: Diagnosis not present

## 2024-03-05 DIAGNOSIS — I5020 Unspecified systolic (congestive) heart failure: Secondary | ICD-10-CM | POA: Diagnosis not present

## 2024-03-05 LAB — BASIC METABOLIC PANEL WITH GFR
Anion gap: 12 (ref 5–15)
BUN: 19 mg/dL (ref 6–20)
CO2: 22 mmol/L (ref 22–32)
Calcium: 9 mg/dL (ref 8.9–10.3)
Chloride: 104 mmol/L (ref 98–111)
Creatinine, Ser: 0.54 mg/dL (ref 0.44–1.00)
GFR, Estimated: >60 mL/min (ref >60)
Glucose, Bld: 84 mg/dL (ref 70–99)
Potassium: 4.5 mmol/L (ref 3.5–5.1)
Sodium: 138 mmol/L (ref 135–145)

## 2024-03-05 LAB — CBC
HCT: 36.1 % (ref 36.0–46.0)
Hemoglobin: 11.3 g/dL — ABNORMAL LOW (ref 12.0–15.0)
MCH: 20.5 pg — ABNORMAL LOW (ref 26.0–34.0)
MCHC: 31.3 g/dL (ref 30.0–36.0)
MCV: 65.5 fL — ABNORMAL LOW (ref 80.0–100.0)
Platelets: 328 K/uL (ref 150–400)
RBC: 5.51 MIL/uL — ABNORMAL HIGH (ref 3.87–5.11)
RDW: 25.6 % — ABNORMAL HIGH (ref 11.5–15.5)
WBC: 12.8 K/uL — ABNORMAL HIGH (ref 4.0–10.5)
nRBC: 0 % (ref 0.0–0.2)

## 2024-03-05 LAB — T4, FREE: Free T4: 1.31 ng/dL — ABNORMAL HIGH (ref 0.61–1.12)

## 2024-03-05 MED ORDER — CARVEDILOL 12.5 MG PO TABS
12.5000 mg | ORAL_TABLET | Freq: Two times a day (BID) | ORAL | 0 refills | Status: AC
  Filled 2024-03-05: qty 60, 30d supply, fill #0

## 2024-03-05 MED ORDER — TRIAMCINOLONE ACETONIDE 0.1 % EX CREA
1.0000 | TOPICAL_CREAM | CUTANEOUS | 0 refills | Status: AC | PRN
  Filled 2024-03-05: qty 30, 15d supply, fill #0

## 2024-03-05 MED ORDER — NICOTINE 7 MG/24HR TD PT24
7.0000 mg | MEDICATED_PATCH | Freq: Every day | TRANSDERMAL | 0 refills | Status: AC
  Filled 2024-03-05: qty 28, 28d supply, fill #0

## 2024-03-05 MED ORDER — VITAMIN B-12 1000 MCG PO TABS
1000.0000 ug | ORAL_TABLET | Freq: Every day | ORAL | 0 refills | Status: AC
  Filled 2024-03-05: qty 30, 30d supply, fill #0

## 2024-03-05 MED ORDER — ADULT MULTIVITAMIN W/MINERALS CH
1.0000 | ORAL_TABLET | Freq: Every day | ORAL | 0 refills | Status: AC
  Filled 2024-03-05: qty 30, 30d supply, fill #0

## 2024-03-05 MED ORDER — LOSARTAN POTASSIUM 25 MG PO TABS
25.0000 mg | ORAL_TABLET | Freq: Every day | ORAL | 0 refills | Status: DC
  Filled 2024-03-05: qty 30, 30d supply, fill #0

## 2024-03-05 MED ORDER — ACETAMINOPHEN 325 MG PO TABS
650.0000 mg | ORAL_TABLET | Freq: Four times a day (QID) | ORAL | 0 refills | Status: AC
  Filled 2024-03-05: qty 90, 12d supply, fill #0

## 2024-03-05 MED ORDER — ENSURE PLUS HIGH PROTEIN PO LIQD
237.0000 mL | Freq: Three times a day (TID) | ORAL | 0 refills | Status: AC
  Filled 2024-03-05: qty 21330, 30d supply, fill #0

## 2024-03-05 MED ORDER — DICLOFENAC SODIUM 1 % EX GEL
4.0000 g | Freq: Four times a day (QID) | CUTANEOUS | 0 refills | Status: AC
  Filled 2024-03-05: qty 100, 7d supply, fill #0

## 2024-03-05 MED ORDER — HYDROXYZINE HCL 10 MG PO TABS
10.0000 mg | ORAL_TABLET | Freq: Three times a day (TID) | ORAL | 0 refills | Status: AC | PRN
  Filled 2024-03-05: qty 30, 10d supply, fill #0

## 2024-03-05 MED ORDER — FUROSEMIDE 20 MG PO TABS
20.0000 mg | ORAL_TABLET | ORAL | 0 refills | Status: AC | PRN
  Filled 2024-03-05: qty 30, 30d supply, fill #0

## 2024-03-05 NOTE — Progress Notes (Signed)
 Discharge order by MD    Printed AVS and gave to patient.  Patient refused review of AVS  Pharmacy unable to fill meds for patient due to insurance issue and patient unable to pay Patient escorted via wheel chair to discharge via private automobile

## 2024-03-05 NOTE — Discharge Summary (Signed)
 Name: EVAN OSBURN MRN: [id number] DOB: 08-Apr-1976 48 y.o. PCP: Patient, No Pcp Per  Date of Admission: 02/27/2024  3:26 AM Date of Discharge: 03/05/24 Attending Physician: Dr. MICAEL Riis Winfrey  Discharge Diagnosis: 1. 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   Hyperthyroidism   Shortness of breath   Sinus tachycardia   HFrEF (heart failure with reduced ejection fraction) (HCC)   Malnutrition of moderate degree   Discharge Medications: Allergies as of 03/05/2024       Reactions   Latex Itching, Rash, Other (See Comments)   Reaction Type: Allergy   Methimazole  Diarrhea   Liver toxicity   Ptu [propylthiouracil] Other (See Comments)   Liver toxicity   Hydrocodone Nausea And Vomiting   Hydrocodone-acetaminophen  Other (See Comments)   Reaction Type: Allergy        Medication List     STOP taking these medications    atenolol  50 MG tablet Commonly known as: TENORMIN    valsartan 80 MG tablet Commonly known as: DIOVAN       TAKE these medications    acetaminophen  325 MG tablet Commonly known as: TYLENOL  Take 2 tablets (650 mg total) by mouth every 6 (six) hours.   carvedilol 12.5 MG tablet Commonly known as: COREG Take 1 tablet (12.5 mg total) by mouth 2 (two) times daily with a meal.   cyanocobalamin  1000 MCG tablet Take 1 tablet (1,000 mcg total) by mouth daily. Start taking on: March 06, 2024   diclofenac  Sodium 1 % Gel Commonly known as: VOLTAREN  Apply 4 g topically 4 (four) times daily for 7 days.   feeding supplement Liqd Take 237 mLs by mouth 3 (three) times daily between meals.   FeroSul 325 (65 Fe) MG tablet Generic drug: ferrous sulfate  Take 325 mg by mouth daily.   furosemide  20 MG tablet Commonly known as: Lasix  Take 1 tablet (20 mg total) by mouth as needed.   hydrOXYzine  10 MG tablet Commonly known as:  ATARAX  Take 1 tablet (10 mg total) by mouth 3 (three) times daily as needed for anxiety.   losartan 25 MG tablet Commonly known as: COZAAR Take 1 tablet (25 mg total) by mouth daily. Start taking on: March 06, 2024   multivitamin with minerals Tabs tablet Take 1 tablet by mouth daily. Start taking on: March 06, 2024   nicotine 7 mg/24hr patch Commonly known as: NICODERM CQ - dosed in mg/24 hr Place 1 patch (7 mg total) onto the skin daily. Start taking on: March 06, 2024   prenatal multivitamin Tabs tablet Take 1 tablet by mouth daily at 12 noon.   triamcinolone cream 0.1 % Commonly known as: KENALOG Apply 1 Application topically as needed (as needed for itching). What changed:  when to take this reasons to take this        Disposition and follow-up:   Ms.Harlan J Crepeau was discharged from [facility] in Good condition.  At the hospital follow up visit please address:  1.  If pt is taking appropriate dose of carvedilol and losartan. -Make sure she reached out to [facility] and made her thyroidectomy arrangements -Discuss her Cocaine use and other polysubstance abuse - Look for signs of volume overload as she was found to have new low EF.   2.  Labs / imaging needed at time of follow-up: free T4, TSH, CBC, BMP  3.  Pending labs/ test needing follow-up: none   Follow-up Appointments: JULIE-ANNE TORAIN is a 48 y.o. 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   Hyperthyroidism Tachycardia Pt presented with SOB that had been going on for 2 weeks on exertion but got progressively worse which brought her to the ED. she was tachycardic.  No overt signs of thyroid   storm present throughout admission. D-dimer mildly elevated upon admission.  Low suspicion for PE was likely due new HFrEF.Elevated free T4 levels and low TSH levels upon admission.  Previously followed Dr. Beryl, however has not seen her in some time.  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 Reports having decreased frequency of cocaine use, however, used cocaine the night prior to admission.  Tachycardia likely due to controlled thyroidism along with polysubstance use.  Started her on carvedilol due to her recent cocaine use.    Told patient that she will not be able to take methimazole  or PTU for history of DILI and only options were thyroidectomy for radioiodine ablation.  Patient did not want to undergo radioiodine ablation and opted for surgery.  Endocrinology consulted from [facility] who recommended starting patient on short course of steroid therapy with thyroidectomy being her ultimate management for hyperthyroidism.  Patient underwent a 5-day course of steroid therapy with decrease in free T4 levels. ENTs at [facility] were not comfortable in performing sugery due to complexity of her case. Transfer to [facility], [facility] was denied due to her being on [place] medicaid and due to no bed availibility, respectively. [facility] declined transfer as there were no beds available and their ENT department was unwilling to perform thyroidectomy for which they recommended medical endocrine optimization prior to considering surgery. Reached out to pt's PCP clinic in [place], [place] [facility] , who said they do not provide referrals for surgery but they offered a referral for pt to see endocrinologist. Spoke to pt who said she can go to [facility] soon but needs to wait on transportation with family till discharge day. Pt's T4 levels went down even after she stopped taking her steroids on discharge day. She was feeling well and was stable. Stated her family will pick her up  later during the day and she can go to [facility] soon. Requested setting up PCP appt at [facility] which we were able to arrange for her on 11/21 at 10:45 AM.  HFrEF Echo upon admission showed worsened EF 40-45% from previous findings with mild LVH, normal RV systolic function, moderate to severe MVR.  This was likely due to uncontrolled hyperthyroidism with tachycardia along with cocaine use.  Troponin leak during admission without signs of ischemia on EKG, likely due to demand.  Patient did have vascular congestion on chest x-ray. New. Suspect secondary to untreated, uncontrolled hyperthyroidism, tachycardia-induced, or polysubtance use.  Cardiology was consulted for further management and they agreed with continuing patient on carvedilol and adding losartan 25 mg daily.  Cardiology also recommended outpatient TEE and then perform heart cath if indicated. Brought up the possibility of pt undergoing thyroidectomy to which cardiology said they will do a pre-op evaluation for her when they see her  OP.  She was initially started on carvedilol 6.25 mg twice daily which was increased to 12.5 mg twice daily as her heart rate remained elevated with lower dose.  Heart rate continued to remain <100 overnight and during discharge day evaluation.  Patient did receive Lasix  20 mg as needed as she felt chest discomfort or SOB due mild volume overload.   - Carvedilol 12.5 mg twice daily - Continue losartan 25 mg daily - Monitor on telemetry  Microcytic anemia Hemoglobin 9.2 upon admission which was similar to prior.  Hgb levels remained stable throughout her admission.  Vitamin B12 very low along with iron studies consistent with deficiency.  Completed IV iron infusion x2.  Supplemented vitamin B12.    Polysubstance use disorder Cocaine, THC, tobacco use Encourage cessation on cocaine use.  Agreed that taking it was dangerous and that she was only doing it to resolve discomfort from her thyroid  levels.    STI testing requested Negative chlamydia/gonorrhea/HIV   Subjective Saw pt at bedside. Said she was feeling well overall. Denies any CP, SOB. Said her family will come pick her up around 6-6:30 PM today and she will go to [facility] to make arrangements for thyroidectomy.   Discharge Exam:   BP 111/75 (BP Location: Right Arm)   Pulse 98   Temp 98 F (36.7 C) (Oral)   Resp 16   Ht 5' 5 (1.651 m)   Wt 51.8 kg   SpO2 100%   BMI 18.99 kg/m  Discharge exam:  HENT:     Head: Normocephalic Neck: Mild thyromegaly with nodularity noted Cardiovascular:     Rate and Rhythm: RRR-no m/r/g; no JVD Pulmonary:     Effort: CTAB.  Respiratory effort is normal    Breath sounds: Normal breath sounds. MSK: No swelling in BL LE Skin:    General: Warm  neurological:     Mental Status: A&O Pertinent Labs, Studies, and Procedures:     Latest Ref Rng & Units 03/05/2024    9:44 AM 03/04/2024    8:24 AM 03/03/2024   10:23 AM  CBC  WBC 4.0 - 10.5 K/uL 12.8  19.9  16.7   Hemoglobin 12.0 - 15.0 g/dL 88.6  89.4  9.7   Hematocrit 36.0 - 46.0 % 36.1  33.2  30.9   Platelets 150 - 400 K/uL 328  342  298        Latest Ref Rng & Units 03/05/2024    9:44 AM 03/04/2024    8:24 AM 03/03/2024   10:23 AM  CMP  Glucose 70 - 99 mg/dL 84  893  882   BUN 6 - 20 mg/dL 19  16  18    Creatinine 0.44 - 1.00 mg/dL 9.45  9.44  9.46   Sodium 135 - 145 mmol/L 138  136  140   Potassium 3.5 - 5.1 mmol/L 4.5  4.0  3.7   Chloride 98 - 111 mmol/L 104  104  107   CO2 22 - 32 mmol/L 22  24  22    Calcium 8.9 - 10.3 mg/dL 9.0  9.2  8.6     ECHOCARDIOGRAM COMPLETE Result Date: 02/27/2024    ECHOCARDIOGRAM REPORT   Patient Name:   KALLA WATSON Whyte Date of Exam: 02/27/2024 Medical Rec #:  [id number]        Height:       65.0 in Accession #:    [id number]       Weight:       118.0 lb  Date of Birth:  08-04-1975         BSA:          1.581 m Patient Age:    48 years         BP:           117/100 mmHg Patient Gender: F                 HR:           118 bpm. Exam Location:  Inpatient Procedure: 2D Echo, Cardiac Doppler and Color Doppler (Both Spectral and Color            Flow Doppler were utilized during procedure). Indications:    Dyspnea R06.00  History:        Patient has prior history of Echocardiogram examinations, most                 recent 05/12/2021. Risk Factors:Hypertension.  Sonographer:    Tinnie Gosling RDCS Referring Phys: [phone] TRAVIS J YOUNG IMPRESSIONS  1. Left ventricular ejection fraction, by estimation, is 40 to 45%. The left ventricle has mildly decreased function. The left ventricle demonstrates global hypokinesis. There is mild left ventricular hypertrophy.  2. Right ventricular systolic function is normal. The right ventricular size is normal. There is moderately elevated pulmonary artery systolic pressure. The estimated right ventricular systolic pressure is 46.7 mmHg.  3. The mitral valve is grossly normal. Moderate to severe mitral valve regurgitation. No evidence of mitral stenosis.  4. The aortic valve is normal in structure. Aortic valve regurgitation is not visualized. No aortic stenosis is present.  5. The inferior vena cava is normal in size with <50% respiratory variability, suggesting right atrial pressure of 8 mmHg. Comparison(s): A prior study was performed on 05/12/2021. LVEF 60 to 65%, normal diastolic function, mild MR, small gradient across the LVOT/aortic valve without obvious subaortic web and valve appears to open, some chordal SAM and hyperdynamic LVEF which may be contributory. See report for additional details. Conclusion(s)/Recommendation(s): Findings concerning for Mitral regurgitation, would recommend Transesophageal Echocardiogram for clarification. FINDINGS  Left Ventricle: Left ventricular ejection fraction, by estimation, is 40 to 45%. The left ventricle has mildly decreased function. The left ventricle demonstrates global hypokinesis. The left ventricular internal cavity size was normal in  size. There is  mild left ventricular hypertrophy. Right Ventricle: The right ventricular size is normal. No increase in right ventricular wall thickness. Right ventricular systolic function is normal. There is moderately elevated pulmonary artery systolic pressure. The tricuspid regurgitant velocity is 3.11 m/s, and with an assumed right atrial pressure of 8 mmHg, the estimated right ventricular systolic pressure is 46.7 mmHg. Left Atrium: Left atrial size was normal in size. Right Atrium: Right atrial size was normal in size. Pericardium: There is no evidence of pericardial effusion. Mitral Valve: The mitral valve is grossly normal. Moderate to severe mitral valve regurgitation. No evidence of mitral valve stenosis. Tricuspid Valve: The tricuspid valve is normal in structure. Tricuspid valve regurgitation is mild . No evidence of tricuspid stenosis. Aortic Valve: The aortic valve is normal in structure. Aortic valve regurgitation is not visualized. No aortic stenosis is present. Pulmonic Valve: The pulmonic valve was grossly normal. Pulmonic valve regurgitation is not visualized. No evidence of pulmonic stenosis. Aorta: The aortic root and ascending aorta are structurally normal, with no evidence of dilitation. Venous: The inferior vena cava is normal in size with less than 50% respiratory variability, suggesting right atrial pressure of 8 mmHg. IAS/Shunts:  The atrial septum is grossly normal.  LEFT VENTRICLE PLAX 2D LVIDd:         4.70 cm LVIDs:         3.70 cm LV PW:         1.10 cm LV IVS:        1.10 cm LVOT diam:     1.70 cm LV SV:         37 LV SV Index:   24 LVOT Area:     2.27 cm LV IVRT:       106 msec  RIGHT VENTRICLE         IVC TAPSE (M-mode): 2.2 cm  IVC diam: 1.40 cm LEFT ATRIUM           Index        RIGHT ATRIUM           Index LA diam:      4.10 cm 2.59 cm/m   RA Area:     11.90 cm LA Vol (A4C): 52.3 ml 33.09 ml/m  RA Volume:   25.10 ml  15.88 ml/m  AORTIC VALVE LVOT Vmax:   118.00 cm/s LVOT  Vmean:  77.900 cm/s LVOT VTI:    0.165 m  AORTA Ao Root diam: 2.40 cm Ao Asc diam:  3.00 cm TRICUSPID VALVE TR Peak grad:   38.7 mmHg TR Vmax:        311.00 cm/s  SHUNTS Systemic VTI:  0.16 m Systemic Diam: 1.70 cm Sunit Tolia Electronically signed by Madonna Large Signature Date/Time: 02/27/2024/2:00:36 PM    Final    DG CHEST PORT 1 VIEW Result Date: 02/27/2024 EXAM: 1 VIEW(S) XRAY OF THE CHEST 02/27/2024 11:26:00 AM COMPARISON: 05/11/2021 CLINICAL HISTORY: Dyspnea FINDINGS: LUNGS AND PLEURA: No focal pulmonary opacity. No pulmonary edema. No pleural effusion. No pneumothorax. HEART AND MEDIASTINUM: Mild cardiomegaly. No acute abnormality of the cardiac and mediastinal silhouettes. BONES AND SOFT TISSUES: No acute osseous abnormality. IMPRESSION: 1. No acute cardiopulmonary process. Electronically signed by: Lynwood Seip MD 02/27/2024 12:33 PM EST RP Workstation: [id number]     Discharge Instructions: Discharge Instructions     Call MD for:  difficulty breathing, headache or visual disturbances   Complete by: As directed    Diet - low sodium heart healthy   Complete by: As directed    Discharge instructions   Complete by: As directed    To [employer] or their caretakers,  You were recently admitted to [facility] for elevated thyroid  levels.   Continue taking your home medications with the following changes:  1. Start taking  a. Carvedilol 12.5 mg tablet: take 1 tablet 2 times daily for high heart rate  b. Losartan 25 mg : take one tablet daily  c. Furosemide  20 mg: Take 1 tablet as needed when you feel volume overloaded or short of breath  d. Tylenol  650 mg: 2 tablets every 6 hours as needed for pain  e. Cyanocobalamin  1000 MCG: 1 tablet daily  f. Voltaren  gel: Apply as needed when in pain  g. Ensure: take 237 ml 3 times a day in between meals  h. Atarax : 1 tablet 3 times a day as needed for anxiety  i. Multivitamin with minerals: 1 tablet by mouth  j. Nicotine patch: 1  patch on the skin daily 2. Stop taking  a. Atenolol   b. Valsartan 3. Continue taking   a. All other home medications  -Make sure you go to [facility] and are able to  make arrangements to get thyroid  surgery done as soon as possible, as discussed -Recommend you following-up with Cardiology for further work up on your new heart failure with a possible imaging done called TEE.    You should seek further medical care if your symptoms worsen.  Please follow up with the following doctors/specialties:  PCP at [facility] in [place]  on 03/12/24 at 10:45 AM  We recommend that you also see your primary care doctor in about a week to make sure that you continue to improve. We are so glad that you are feeling better.  Sincerely,  [facility]   Increase activity slowly   Complete by: As directed        Signed: Edgardo Pontiff, DO 03/05/2024, 2:42 PM

## 2024-03-05 NOTE — Discharge Instructions (Addendum)
 To [employer] or their caretakers,  You were recently admitted to [facility] for elevated thyroid  levels.   Continue taking your home medications with the following changes:  Start taking Carvedilol 12.5 mg tablet: take 1 tablet 2 times daily for high heart rate Losartan 25 mg : take one tablet daily Furosemide  20 mg: Take 1 tablet as needed when you feel volume overloaded or short of breath Tylenol  650 mg: 2 tablets every 6 hours as needed for pain Cyanocobalamin  1000 MCG: 1 tablet daily Voltaren  gel: Apply as needed when in pain Ensure: take 237 ml 3 times a day in between meals Atarax : 1 tablet 3 times a day as needed for anxiety Multivitamin with minerals: 1 tablet by mouth Nicotine patch: 1 patch on the skin daily Stop taking Atenolol  Valsartan Continue taking  All other home medications  -Make sure you go to [facility] and are able to make arrangements to get thyroid  surgery done as soon as possible, as discussed -Recommend you following-up with Cardiology for further work up on your new heart failure with a possible imaging done called TEE.    You should seek further medical care if your symptoms worsen.  Please follow up with the following doctors/specialties: PCP at [facility] in [place]  on 03/12/24 at 10:45 AM  We recommend that you also see your primary care doctor in about a week to make sure that you continue to improve. We are so glad that you are feeling better.  Sincerely,  [facility]

## 2024-03-05 NOTE — Plan of Care (Signed)

## 2024-03-08 ENCOUNTER — Other Ambulatory Visit (HOSPITAL_COMMUNITY): Payer: Self-pay

## 2024-03-10 ENCOUNTER — Encounter: Payer: Self-pay | Admitting: *Deleted

## 2024-03-12 ENCOUNTER — Ambulatory Visit: Attending: Emergency Medicine | Admitting: Emergency Medicine

## 2024-03-12 NOTE — Progress Notes (Deleted)
  Cardiology Office Note:    Date:  03/12/2024  ID:  TORIANN Frey, DOB 16-Apr-1976, MRN [id number] PCP: Patient, No Pcp Per  [facility] Providers Cardiologist:  None { Click to update primary MD,subspecialty MD or APP then REFRESH:1}    {Click to Open Review  :1}   Patient Profile:       Chief Complaint: *** History of Present Illness:  Mary Frey is a 48 y.o. female with visit-pertinent history of Graves' disease, hyperthyroidism, hypertension, hyperlipidemia, polysubstance abuse, chronic HFrEF, moderate to severe MR  Patient previously had an echocardiogram in 12/2012 that showed an EF of 25 to 30%.  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  She was started on propranolol  instructed to follow-up with endocrinology.  Patient did not follow-up with cardiology.  Later echocardiogram in 04/2021 showed an LVEF of 60 to 65%, no RWMA, normal RV systolic function, mild MR.  Patient was evaluated by cardiology on 02/27/2024 for evaluation of CHF.  Patient reported to the ED on 11/7 reporting chest pains and shortness of breath.  Patient was admitted the night prior to her presentation as she drank alcohol  and used cocaine.  BNP was elevated to 721.  High sensitive troponins 88, 94.  D-dimer was elevated at 1.94 however patient refused CTA chest.  TSH was undetectable and free T4 was elevated at 2.39.  Drug screen positive for cocaine.  Echocardiogram showed LVEF 40 to 45%, mild LVH, normal RV systolic function, moderate to severe MR.  Was suspected her reduced EF was in the setting of cocaine use, uncontrolled hyperthyroidism but tachycardia.  Catheterization was avoided given her recent cocaine use.  She was started on carvedilol  6.25 mg twice daily and losartan  25 mg daily.  Discussed the use of AI scribe software for clinical note transcription with the patient, who gave verbal consent to proceed.  History of Present  Illness     Review of systems:  Please see the history of present illness. All other systems are reviewed and otherwise negative. ***      Studies Reviewed:        ***  Risk Assessment/Calculations:   {Does this patient have ATRIAL FIBRILLATION?:[phone]} No BP recorded.  {Refresh Note OR Click here to enter BP  :1}***        Physical Exam:   VS:  There were no vitals taken for this visit.   Wt Readings from Last 3 Encounters:  03/05/24 114 lb 1.6 oz (51.8 kg)  10/15/23 117 lb 15.1 oz (53.5 kg)  09/02/22 118 lb (53.5 kg)    GEN: Well nourished, well developed in no acute distress NECK: No JVD; No carotid bruits CARDIAC: ***RRR, no murmurs, rubs, gallops RESPIRATORY:  Clear to auscultation without rales, wheezing or rhonchi  ABDOMEN: Soft, non-tender, non-distended EXTREMITIES:  No edema; No acute deformity ***      Assessment and Plan:    Assessment and Plan Assessment & Plan      {Are you ordering a CV Procedure (e.g. stress test, cath, DCCV, TEE, etc)?   Press F2        :[id number]}  Dispo:  No follow-ups on file.  Signed, Lum LITTIE Louis, NP

## 2024-03-13 ENCOUNTER — Other Ambulatory Visit: Payer: Self-pay

## 2024-03-13 ENCOUNTER — Emergency Department (HOSPITAL_COMMUNITY)
Admission: EM | Admit: 2024-03-13 | Discharge: 2024-03-13 | Disposition: A | Discharge status: Home / Self Care | Attending: Emergency Medicine | Admitting: Emergency Medicine

## 2024-03-13 DIAGNOSIS — F419 Anxiety disorder, unspecified: Secondary | ICD-10-CM | POA: Diagnosis present

## 2024-03-13 DIAGNOSIS — Z91040 Latex allergy status: Secondary | ICD-10-CM | POA: Insufficient documentation

## 2024-03-13 MED ORDER — HYDROXYZINE HCL 25 MG PO TABS
25.0000 mg | ORAL_TABLET | Freq: Once | ORAL | Status: AC
  Administered 2024-03-13: 25 mg via ORAL
  Filled 2024-03-13: qty 1

## 2024-03-13 NOTE — ED Notes (Signed)
 SW at bedside.
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
## 2024-03-13 NOTE — Discharge Instructions (Addendum)
 I have attached list of local shelters if needed for more emergent housing.  24-Hour Crisis Line [phone]
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
## 2024-03-13 NOTE — ED Triage Notes (Signed)
 Patient reports panic/anxiety attack this evening , calm at arrival . Respirations unlabored/ambulatory .

## 2024-04-07 ENCOUNTER — Emergency Department (HOSPITAL_COMMUNITY)
Admission: EM | Admit: 2024-04-07 | Discharge: 2024-04-07 | Disposition: A | Discharge status: Home / Self Care | Source: Home / Self Care | Attending: Emergency Medicine | Admitting: Emergency Medicine

## 2024-04-07 ENCOUNTER — Emergency Department (HOSPITAL_COMMUNITY)

## 2024-04-07 ENCOUNTER — Encounter (HOSPITAL_COMMUNITY): Payer: Self-pay | Admitting: Emergency Medicine

## 2024-04-07 ENCOUNTER — Other Ambulatory Visit: Payer: Self-pay

## 2024-04-07 ENCOUNTER — Encounter: Payer: Self-pay | Admitting: Oncology

## 2024-04-07 DIAGNOSIS — R079 Chest pain, unspecified: Secondary | ICD-10-CM | POA: Diagnosis present

## 2024-04-07 DIAGNOSIS — I5023 Acute on chronic systolic (congestive) heart failure: Secondary | ICD-10-CM | POA: Diagnosis not present

## 2024-04-07 DIAGNOSIS — Z91040 Latex allergy status: Secondary | ICD-10-CM | POA: Diagnosis not present

## 2024-04-07 LAB — BASIC METABOLIC PANEL WITH GFR
Anion gap: 17 — ABNORMAL HIGH (ref 5–15)
BUN: 8 mg/dL (ref 6–20)
CO2: 17 mmol/L — ABNORMAL LOW (ref 22–32)
Calcium: 9.5 mg/dL (ref 8.9–10.3)
Chloride: 103 mmol/L (ref 98–111)
Creatinine, Ser: 0.53 mg/dL (ref 0.44–1.00)
GFR, Estimated: >60 mL/min (ref >60)
Glucose, Bld: 95 mg/dL (ref 70–99)
Potassium: 4 mmol/L (ref 3.5–5.1)
Sodium: 137 mmol/L (ref 135–145)

## 2024-04-07 LAB — CBC
HCT: 37.3 % (ref 36.0–46.0)
Hemoglobin: 12.3 g/dL (ref 12.0–15.0)
MCH: 23.1 pg — ABNORMAL LOW (ref 26.0–34.0)
MCHC: 33 g/dL (ref 30.0–36.0)
MCV: 70.1 fL — ABNORMAL LOW (ref 80.0–100.0)
Platelets: 296 K/uL (ref 150–400)
RBC: 5.32 MIL/uL — ABNORMAL HIGH (ref 3.87–5.11)
RDW: 24 % — ABNORMAL HIGH (ref 11.5–15.5)
WBC: 8.8 K/uL (ref 4.0–10.5)
nRBC: 0 % (ref 0.0–0.2)

## 2024-04-07 LAB — HCG, SERUM, QUALITATIVE: Preg, Serum: NEGATIVE

## 2024-04-07 LAB — TROPONIN T, HIGH SENSITIVITY: Troponin T High Sensitivity: 16 ng/L (ref 0–19)

## 2024-04-07 MED ORDER — VALSARTAN 40 MG PO TABS: 40.0000 mg | ORAL_TABLET | Freq: Every day | ORAL | 0 refills | Status: AC
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
## 2024-04-07 NOTE — ED Notes (Addendum)
 EKG completed. Given to Dr.Floyd

## 2024-04-07 NOTE — ED Triage Notes (Signed)
 Patient [facility]IB [facility] c/o sob.  EMS reports lung sounds clear and then patient began c/o chest pain.  Patient recently diagnosed with hyperthyroidism and has not been taking meds.  136/74 HR 61 CBG 123 Spo2 98%

## 2024-04-07 NOTE — ED Notes (Signed)
 Awaiting patient from lobby.
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
## 2024-04-07 NOTE — ED Notes (Addendum)
 Counselling Psychologist calling social work to meet patient in lobby about resources.

## 2024-04-07 NOTE — ED Notes (Signed)
 Social work to come to lobby to see patient per DO Emil and [place], Consulting Civil Engineer.

## 2024-04-07 NOTE — ED Notes (Signed)
 Pt given sandwich bag with crackers, ,apple juice, and ice.

## 2024-04-07 NOTE — ED Notes (Signed)
 Patient transported to X-ray

## 2024-04-07 NOTE — Discharge Instructions (Addendum)
 Please follow-up with the cardiology clinic in the office.  Please return for worsening difficulty breathing chest pain or if you pass out.  Please take your medications as prescribed.

## 2024-04-07 NOTE — ED Provider Triage Note (Signed)
 Emergency Medicine Provider Triage Evaluation Note  Mary Frey , a 48 y.o. female  was evaluated in triage.  Pt complains of chest pain x1 week. States she thinks she over exerted herself. She states it got worse tonight when she was walking in [employer].   Review of Systems  Positive: Chest pain, shortness of breath Negative: Vomiting, diarrhea  Physical Exam  BP (!) 140/92 (BP Location: Right Arm)   Pulse 60   Temp 98.3 F (36.8 C) (Oral)   Resp 18   Wt 52 kg   SpO2 99%   BMI 19.08 kg/m  Gen:   Awake, no distress   Resp:  Normal effort  MSK:   Moves extremities without difficulty    Medical Decision Making  Medically screening exam initiated at 2:47 AM.  Appropriate orders placed.  Mary Frey was informed that the remainder of the evaluation will be completed by another provider, this initial triage assessment does not replace that evaluation, and the importance of remaining in the ED until their evaluation is complete.     Gennaro Duwaine CROME, DO 04/07/24 [phone]

## 2024-04-08 ENCOUNTER — Encounter: Payer: Self-pay | Admitting: Oncology
